# Patient Record
Sex: Male | Born: 1940 | Race: White | Hispanic: No | Marital: Single | Smoking: Never smoker
Health system: Southern US, Community
[De-identification: ages and names within clinical notes are randomized; demographics above are authoritative.]

## PROBLEM LIST (undated history)

## (undated) DIAGNOSIS — E7800 Pure hypercholesterolemia, unspecified: Secondary | ICD-10-CM

## (undated) DIAGNOSIS — F0390 Unspecified dementia without behavioral disturbance: Secondary | ICD-10-CM

## (undated) DIAGNOSIS — I10 Essential (primary) hypertension: Secondary | ICD-10-CM

## (undated) DIAGNOSIS — I4891 Unspecified atrial fibrillation: Secondary | ICD-10-CM

## (undated) DIAGNOSIS — K219 Gastro-esophageal reflux disease without esophagitis: Secondary | ICD-10-CM

## (undated) DIAGNOSIS — K7460 Unspecified cirrhosis of liver: Secondary | ICD-10-CM

## (undated) DIAGNOSIS — N189 Chronic kidney disease, unspecified: Secondary | ICD-10-CM

## (undated) DIAGNOSIS — I509 Heart failure, unspecified: Secondary | ICD-10-CM

## (undated) HISTORY — PX: CORONARY ARTERY BYPASS GRAFT: SHX141

## (undated) HISTORY — PX: CARDIAC VALVE REPLACEMENT: SHX585

## (undated) HISTORY — PX: MOUTH SURGERY: SHX715

## (undated) HISTORY — PX: AORTIC VALVE REPLACEMENT: SHX41

## (undated) HISTORY — PX: OTHER SURGICAL HISTORY: SHX169

---

## 2004-12-22 ENCOUNTER — Other Ambulatory Visit: Payer: Self-pay

## 2004-12-22 ENCOUNTER — Inpatient Hospital Stay: Payer: Self-pay | Admitting: Internal Medicine

## 2006-04-23 ENCOUNTER — Ambulatory Visit: Payer: Self-pay | Admitting: Internal Medicine

## 2011-05-09 ENCOUNTER — Inpatient Hospital Stay: Payer: Self-pay | Admitting: Internal Medicine

## 2011-05-16 ENCOUNTER — Emergency Department: Payer: Self-pay | Admitting: Emergency Medicine

## 2011-05-21 ENCOUNTER — Inpatient Hospital Stay: Payer: Self-pay | Admitting: Internal Medicine

## 2011-05-22 LAB — CBC WITH DIFFERENTIAL/PLATELET
Basophil #: 0 10*3/uL (ref 0.0–0.1)
Basophil %: 0.5 %
Eosinophil %: 1.3 %
HGB: 11.2 g/dL — ABNORMAL LOW (ref 13.0–18.0)
Lymphocyte #: 1.2 10*3/uL (ref 1.0–3.6)
Lymphocyte %: 19.3 %
MCHC: 33 g/dL (ref 32.0–36.0)
Monocyte #: 0.5 10*3/uL (ref 0.0–0.7)
Monocyte %: 8.2 %
Neutrophil %: 70.7 %
Platelet: 169 10*3/uL (ref 150–440)
RBC: 4.09 10*6/uL — ABNORMAL LOW (ref 4.40–5.90)
WBC: 6.3 10*3/uL (ref 3.8–10.6)

## 2011-05-22 LAB — CK TOTAL AND CKMB (NOT AT ARMC)
CK, Total: 122 U/L (ref 35–232)
CK-MB: 8.6 ng/mL — ABNORMAL HIGH (ref 0.5–3.6)

## 2011-05-22 LAB — BASIC METABOLIC PANEL
BUN: 36 mg/dL — ABNORMAL HIGH (ref 7–18)
Chloride: 101 mmol/L (ref 98–107)
Co2: 25 mmol/L (ref 21–32)
EGFR (Non-African Amer.): 22 — ABNORMAL LOW
Glucose: 71 mg/dL (ref 65–99)
Potassium: 6 mmol/L — ABNORMAL HIGH (ref 3.5–5.1)
Sodium: 134 mmol/L — ABNORMAL LOW (ref 136–145)

## 2011-05-22 LAB — PRO B NATRIURETIC PEPTIDE: B-Type Natriuretic Peptide: 34595 pg/mL — ABNORMAL HIGH (ref 0–125)

## 2011-05-22 LAB — PHOSPHORUS: Phosphorus: 4.1 mg/dL (ref 2.5–4.9)

## 2011-05-22 LAB — TROPONIN I: Troponin-I: 3.8 ng/mL — ABNORMAL HIGH

## 2011-05-23 LAB — CBC WITH DIFFERENTIAL/PLATELET
Basophil #: 0 10*3/uL (ref 0.0–0.1)
Basophil %: 0.8 %
Eosinophil #: 0.1 10*3/uL (ref 0.0–0.7)
Eosinophil %: 1.2 %
HCT: 34.8 % — ABNORMAL LOW (ref 40.0–52.0)
HGB: 11.3 g/dL — ABNORMAL LOW (ref 13.0–18.0)
Lymphocyte #: 1.1 10*3/uL (ref 1.0–3.6)
Lymphocyte %: 19.6 %
MCH: 27.1 pg (ref 26.0–34.0)
MCHC: 32.5 g/dL (ref 32.0–36.0)
MCV: 84 fL (ref 80–100)
Monocyte #: 0.6 10*3/uL (ref 0.0–0.7)
Monocyte %: 10.6 %
Neutrophil #: 3.9 10*3/uL (ref 1.4–6.5)
Neutrophil %: 67.8 %
Platelet: 170 10*3/uL (ref 150–440)
RBC: 4.16 10*6/uL — ABNORMAL LOW (ref 4.40–5.90)
RDW: 17.4 % — ABNORMAL HIGH (ref 11.5–14.5)
WBC: 5.8 10*3/uL (ref 3.8–10.6)

## 2011-05-23 LAB — URINALYSIS, COMPLETE
Blood: NEGATIVE
Glucose,UR: NEGATIVE mg/dL (ref 0–75)
Ketone: NEGATIVE
Leukocyte Esterase: NEGATIVE
Nitrite: NEGATIVE
Ph: 6 (ref 4.5–8.0)
Protein: NEGATIVE
Specific Gravity: 1.009 (ref 1.003–1.030)
WBC UR: 2 /HPF (ref 0–5)

## 2011-05-23 LAB — COMPREHENSIVE METABOLIC PANEL
Albumin: 2.9 g/dL — ABNORMAL LOW (ref 3.4–5.0)
Alkaline Phosphatase: 55 U/L (ref 50–136)
Anion Gap: 11 (ref 7–16)
BUN: 35 mg/dL — ABNORMAL HIGH (ref 7–18)
Bilirubin,Total: 0.5 mg/dL (ref 0.2–1.0)
Calcium, Total: 8.5 mg/dL (ref 8.5–10.1)
Chloride: 105 mmol/L (ref 98–107)
Co2: 27 mmol/L (ref 21–32)
Creatinine: 2.96 mg/dL — ABNORMAL HIGH (ref 0.60–1.30)
EGFR (African American): 27 — ABNORMAL LOW
EGFR (Non-African Amer.): 22 — ABNORMAL LOW
Glucose: 104 mg/dL — ABNORMAL HIGH (ref 65–99)
Osmolality: 293 (ref 275–301)
Potassium: 5.4 mmol/L — ABNORMAL HIGH (ref 3.5–5.1)
SGOT(AST): 29 U/L (ref 15–37)
SGPT (ALT): 19 U/L
Sodium: 143 mmol/L (ref 136–145)
Total Protein: 5.8 g/dL — ABNORMAL LOW (ref 6.4–8.2)

## 2011-05-23 LAB — PROTEIN / CREATININE RATIO, URINE
Creatinine, Urine: 145.7 mg/dL — ABNORMAL HIGH (ref 30.0–125.0)
Protein/Creat. Ratio: 96 mg/gCREAT (ref 0–200)

## 2011-05-24 LAB — RENAL FUNCTION PANEL
Anion Gap: 8 (ref 7–16)
BUN: 28 mg/dL — ABNORMAL HIGH (ref 7–18)
Creatinine: 2.24 mg/dL — ABNORMAL HIGH (ref 0.60–1.30)
EGFR (African American): 38 — ABNORMAL LOW
EGFR (Non-African Amer.): 31 — ABNORMAL LOW
Glucose: 137 mg/dL — ABNORMAL HIGH (ref 65–99)
Osmolality: 291 (ref 275–301)
Phosphorus: 3.6 mg/dL (ref 2.5–4.9)
Potassium: 5.4 mmol/L — ABNORMAL HIGH (ref 3.5–5.1)
Sodium: 142 mmol/L (ref 136–145)

## 2011-07-01 ENCOUNTER — Ambulatory Visit: Payer: Self-pay | Admitting: Internal Medicine

## 2014-02-20 LAB — PRO B NATRIURETIC PEPTIDE: B-Type Natriuretic Peptide: 793 pg/mL — ABNORMAL HIGH (ref 0–125)

## 2014-02-20 LAB — BASIC METABOLIC PANEL
Anion Gap: 8 (ref 7–16)
BUN: 14 mg/dL (ref 7–18)
CALCIUM: 8.2 mg/dL — AB (ref 8.5–10.1)
Chloride: 108 mmol/L — ABNORMAL HIGH (ref 98–107)
Co2: 25 mmol/L (ref 21–32)
Creatinine: 1.41 mg/dL — ABNORMAL HIGH (ref 0.60–1.30)
EGFR (African American): >60
EGFR (Non-African Amer.): 52 — ABNORMAL LOW
GLUCOSE: 147 mg/dL — AB (ref 65–99)
OSMOLALITY: 284 (ref 275–301)
POTASSIUM: 3.7 mmol/L (ref 3.5–5.1)
SODIUM: 141 mmol/L (ref 136–145)

## 2014-02-20 LAB — APTT: Activated PTT: 51.6 secs — ABNORMAL HIGH (ref 23.6–35.9)

## 2014-02-20 LAB — PROTIME-INR
INR: 2.3
PROTHROMBIN TIME: 25 s — AB (ref 11.5–14.7)

## 2014-02-20 LAB — CBC
HCT: 44.8 % (ref 40.0–52.0)
HGB: 14.4 g/dL (ref 13.0–18.0)
MCH: 22.8 pg — ABNORMAL LOW (ref 26.0–34.0)
MCHC: 32.1 g/dL (ref 32.0–36.0)
MCV: 71 fL — AB (ref 80–100)
Platelet: 177 10*3/uL (ref 150–440)
RBC: 6.32 10*6/uL — ABNORMAL HIGH (ref 4.40–5.90)
RDW: 20.1 % — AB (ref 11.5–14.5)
WBC: 10.8 10*3/uL — ABNORMAL HIGH (ref 3.8–10.6)

## 2014-02-20 LAB — TROPONIN I: Troponin-I: <0.02 ng/mL

## 2014-02-21 ENCOUNTER — Observation Stay: Payer: Self-pay | Admitting: Internal Medicine

## 2014-02-21 LAB — COMPREHENSIVE METABOLIC PANEL
ANION GAP: 4 — AB (ref 7–16)
Albumin: 3 g/dL — ABNORMAL LOW (ref 3.4–5.0)
Alkaline Phosphatase: 116 U/L
BUN: 12 mg/dL (ref 7–18)
Bilirubin,Total: 0.3 mg/dL (ref 0.2–1.0)
CHLORIDE: 110 mmol/L — AB (ref 98–107)
CO2: 30 mmol/L (ref 21–32)
CREATININE: 1.45 mg/dL — AB (ref 0.60–1.30)
Calcium, Total: 7.8 mg/dL — ABNORMAL LOW (ref 8.5–10.1)
EGFR (African American): >60
EGFR (Non-African Amer.): 51 — ABNORMAL LOW
Glucose: 101 mg/dL — ABNORMAL HIGH (ref 65–99)
Osmolality: 287 (ref 275–301)
POTASSIUM: 3.5 mmol/L (ref 3.5–5.1)
SGOT(AST): 52 U/L — ABNORMAL HIGH (ref 15–37)
SGPT (ALT): 90 U/L — ABNORMAL HIGH
Sodium: 144 mmol/L (ref 136–145)
Total Protein: 5.6 g/dL — ABNORMAL LOW (ref 6.4–8.2)

## 2014-02-21 LAB — PROTIME-INR
INR: 2.9
Prothrombin Time: 29.2 secs — ABNORMAL HIGH (ref 11.5–14.7)

## 2014-02-21 LAB — TROPONIN I
Troponin-I: 0.02 ng/mL
Troponin-I: 0.02 ng/mL

## 2014-02-22 ENCOUNTER — Emergency Department: Payer: Self-pay | Admitting: Emergency Medicine

## 2014-02-22 LAB — BASIC METABOLIC PANEL
ANION GAP: 2 — AB (ref 7–16)
Anion Gap: 6 — ABNORMAL LOW (ref 7–16)
BUN: 13 mg/dL (ref 7–18)
BUN: 16 mg/dL (ref 7–18)
CALCIUM: 7.8 mg/dL — AB (ref 8.5–10.1)
Calcium, Total: 7.9 mg/dL — ABNORMAL LOW (ref 8.5–10.1)
Chloride: 110 mmol/L — ABNORMAL HIGH (ref 98–107)
Chloride: 110 mmol/L — ABNORMAL HIGH (ref 98–107)
Co2: 28 mmol/L (ref 21–32)
Co2: 26 mmol/L (ref 21–32)
Creatinine: 1.49 mg/dL — ABNORMAL HIGH (ref 0.60–1.30)
Creatinine: 1.62 mg/dL — ABNORMAL HIGH (ref 0.60–1.30)
EGFR (Non-African Amer.): 49 — ABNORMAL LOW
EGFR (Non-African Amer.): 45 — ABNORMAL LOW
GFR CALC AF AMER: 60 — AB
GFR CALC AF AMER: 54 — AB
GLUCOSE: 99 mg/dL (ref 65–99)
Glucose: 103 mg/dL — ABNORMAL HIGH (ref 65–99)
OSMOLALITY: 287 (ref 275–301)
Osmolality: 277 (ref 275–301)
POTASSIUM: 3.8 mmol/L (ref 3.5–5.1)
Potassium: 3.6 mmol/L (ref 3.5–5.1)
SODIUM: 144 mmol/L (ref 136–145)
Sodium: 138 mmol/L (ref 136–145)

## 2014-02-22 LAB — CBC WITH DIFFERENTIAL/PLATELET
BASOS ABS: 0.1 10*3/uL (ref 0.0–0.1)
BASOS ABS: 0.1 10*3/uL (ref 0.0–0.1)
Basophil %: 1 %
Basophil %: 1.3 %
EOS PCT: 1.5 %
EOS PCT: 0.8 %
Eosinophil #: 0.1 10*3/uL (ref 0.0–0.7)
Eosinophil #: 0.1 10*3/uL (ref 0.0–0.7)
HCT: 38.2 % — AB (ref 40.0–52.0)
HCT: 44 % (ref 40.0–52.0)
HGB: 12.3 g/dL — ABNORMAL LOW (ref 13.0–18.0)
HGB: 13.9 g/dL (ref 13.0–18.0)
LYMPHS ABS: 1.9 10*3/uL (ref 1.0–3.6)
Lymphocyte #: 1.1 10*3/uL (ref 1.0–3.6)
Lymphocyte %: 14.4 %
Lymphocyte %: 16.7 %
MCH: 23.1 pg — AB (ref 26.0–34.0)
MCH: 22.7 pg — AB (ref 26.0–34.0)
MCHC: 32.1 g/dL (ref 32.0–36.0)
MCHC: 31.6 g/dL — AB (ref 32.0–36.0)
MCV: 72 fL — ABNORMAL LOW (ref 80–100)
MCV: 72 fL — ABNORMAL LOW (ref 80–100)
Monocyte #: 0.7 x10 3/mm (ref 0.2–1.0)
Monocyte #: 1.2 x10 3/mm — ABNORMAL HIGH (ref 0.2–1.0)
Monocyte %: 9 %
Monocyte %: 10.4 %
NEUTROS ABS: 5.5 10*3/uL (ref 1.4–6.5)
Neutrophil #: 8.1 10*3/uL — ABNORMAL HIGH (ref 1.4–6.5)
Neutrophil %: 74.1 %
Neutrophil %: 70.8 %
Platelet: 134 10*3/uL — ABNORMAL LOW (ref 150–440)
Platelet: 198 10*3/uL (ref 150–440)
RBC: 5.3 10*6/uL (ref 4.40–5.90)
RBC: 6.12 10*6/uL — ABNORMAL HIGH (ref 4.40–5.90)
RDW: 20.2 % — AB (ref 11.5–14.5)
RDW: 20.5 % — ABNORMAL HIGH (ref 11.5–14.5)
WBC: 7.5 10*3/uL (ref 3.8–10.6)
WBC: 11.4 10*3/uL — ABNORMAL HIGH (ref 3.8–10.6)

## 2014-02-22 LAB — LIPID PANEL
Cholesterol: 131 mg/dL (ref 0–200)
HDL: 33 mg/dL — AB (ref 40–60)
Ldl Cholesterol, Calc: 79 mg/dL (ref 0–100)
Triglycerides: 93 mg/dL (ref 0–200)
VLDL Cholesterol, Calc: 19 mg/dL (ref 5–40)

## 2014-02-22 LAB — TSH: THYROID STIMULATING HORM: 4.28 u[IU]/mL

## 2014-02-22 LAB — HEMOGLOBIN A1C: Hemoglobin A1C: 6.6 % — ABNORMAL HIGH (ref 4.2–6.3)

## 2014-02-22 LAB — PROTIME-INR
INR: 2.6
Prothrombin Time: 27.2 secs — ABNORMAL HIGH (ref 11.5–14.7)

## 2014-02-22 LAB — MAGNESIUM: Magnesium: 1.9 mg/dL

## 2014-02-22 LAB — TROPONIN I

## 2014-02-26 LAB — CULTURE, BLOOD (SINGLE)

## 2014-07-06 ENCOUNTER — Ambulatory Visit: Payer: Self-pay | Admitting: Internal Medicine

## 2014-07-08 ENCOUNTER — Emergency Department: Payer: Self-pay | Admitting: Internal Medicine

## 2014-08-09 ENCOUNTER — Ambulatory Visit: Payer: Self-pay | Admitting: Internal Medicine

## 2014-09-11 NOTE — Discharge Summary (Signed)
PATIENT NAME:  [name], [name] MR#:  [id number] DATE OF BIRTH:  April 29, 1941  DATE OF ADMISSION:  02/21/2014 DATE OF DISCHARGE:  02/22/2014  ADMISSION DIAGNOSES:   1.  Chest pain with a history of coronary artery disease.   2.  Dizziness.   DISCHARGE DIAGNOSES:  1.  Chest pain likely related to a gastrointestinal issue and not cardiac in nature, has ruled out for acute coronary syndrome.  2.  Orthostatic hypotension.  3.  History of coronary artery disease.   4.  Left bundle branch block, old.  5.  Borderline type 2 diabetes.  6.  Arrhythmia likely paroxysmal atrial fibrillation.  7.  Pneumonia has been ruled out.   HOSPITAL COURSE: This is a 74 year old male who presented with chest pain relieved with GI cocktail and dizziness. For further details please refer to the H and P.   1.  Chest pain.  The patient had no chest pain during this whole hospitalization. His troponins were all negative and he had an echocardiogram which showed ejection fraction of 65-70%, impaired relaxation of LV diastolic dysfunction, left bundle branch block which is old, but no wall motion abnormalities. I suspect again this is all GI related. But given his history of CAD he was admitted to rule out acute coronary syndrome.  2.  Orthostatic hypotension as the cause of the patient's dizziness. The patient did dizziness have upon standing when he was orthostatic. We provided IV fluids, he is no longer dizzy, he is no longer orthostatic.  3.  History of CAD. The patient will continue on statin and Plavix.  4.  Left bundle branch block which is old.  5.  Borderline type 2 diabetes. The patient will need to have followup outpatient. He will need referral to outpatient diabetes center.   6.  Suspected pneumonia had been ruled out. Initially the patient was thought to have possible pneumonia but this was ruled out by the CT of the chest which was performed on admission showing no evidence of pneumonia. 7.  Arrhythmia. The patient  likely has underlying atrial fibrillation, he is on Coumadin and amiodarone.   DISCHARGE MEDICATIONS:  1. Lasix 20 mg daily.  2. Atorvastatin 20 mg daily.  3. Prevacid 30 mg daily.  4. Plavix 75 mg daily.  5. Amiodarone 200 mg daily.  6. Coumadin 2 mg at bedtime.  7. Nitroglycerin sublingual p.r.n. chest pain.   DISPOSITION:  Discharge home with home health, physical therapy, and nurse for assessment and gait.   DISCHARGE DIET: Low sodium.   DISCHARGE ACTIVITY: As tolerated.   DISCHARGE FOLLOWUP: The patient needs to follow up with Dr. Juel BurrowMasoud next week.   TIME SPENT: 35 minutes.   The patient was stable for discharge.    ____________________________ Endrit Gittins P. Juliene PinaMody, MD spm:bu D: 02/22/2014 13:19:42 ET T: 02/22/2014 14:31:54 ET JOB#: [id number]  cc: Kamaya Keckler P. Juliene PinaMody, MD, <Dictator> Corky DownsJaved Masoud, MD Janyth ContesSITAL P Isabel Freese MD ELECTRONICALLY SIGNED 02/22/2014 21:12

## 2014-09-11 NOTE — H&P (Signed)
PATIENT NAME:  [name], [name] MR#:  [id number] DATE OF BIRTH:  1941-03-21  DATE OF ADMISSION:  02/21/2014  REFERRING PHYSICIAN: Gladstone Pih, MD  PRIMARY CARE DOCTOR: Corky Downs, MD  ADMITTING DOCTOR: Crissie Figures, MD  CHIEF COMPLAINT: Substernal/epigastric chest pain.  HISTORY OF PRESENT ILLNESS: A 74 year old Caucasian male with a past medical history significant for hypertension, coronary artery disease status post CABG in 2012, history of moderate to severe aortic stenosis, chronic systolic congestive heart failure with ejection fraction of 25% to 30%, gastroesophageal reflux disease, CKD stage 3, hyperlipidemia, presents to the Emergency Room with a complaint of substernal/epigastric chest pain of burning nature since this morning. Patient states he was in his usual state of health until this morning, when he has been experiencing episodes of substernal burning chest discomfort going on and off. Denies any diaphoresis. No radiation of pain. Does feel some dizziness on standing on and off, but no loss of consciousness. Denies any fever, cough, shortness of breath. No nausea, vomiting, diarrhea, constipation. Denies any dysuria or hematuria. In the Emergency Room, the patient was evaluated by the ED physician, and on presentation his blood pressure was elevated at 165/92 and he was given 1 sublingual nitroglycerin, following which he had partial relief of the substernal chest discomfort. He also received GI cocktail at the same time, which relieved his substernal pain completely. Of note, following sublingual nitroglycerin, his blood pressure has dropped transiently to 103/65 and patient did not have any symptoms at that time, and subsequently the blood pressure has improved and is currently staying in the normal range, around 140/80. No history of any recent fever, cough. Patient now is resting comfortably in the bed. Denies any chest pain or shortness of breath. Chest x-ray done in the Emergency  Room by the ED physician shows abnormality, with volume loss/consolidation in the left lung base with probable small left pleural effusion, suggesting pneumonia. Patient denies any fever, cough, or sputum production.  LABORATORY DATA: Labs are significant for mildly elevated white blood cell count of 10.8.  PAST MEDICAL HISTORY: Hypertension; coronary artery disease status post CABG in 2012; history of chronic systolic congestive heart failure with ejection fraction of 25% to 30% per echo done in 2012; moderate to severe aortic stenosis; moderate aortic regurgitation per echo done in December 2012; hyperlipidemia; gastroesophageal reflux disease; chronic kidney disease stage 3; history of hyperkalemia, hence he is not on any ACE inhibitors; left bundle branch block.  PAST SURGICAL HISTORY: Coronary artery bypass graft in 2012.  ALLERGIES: No known drug allergies.  HOME MEDICATIONS: Amiodarone 200 mg tablet, 1 tablet orally; atorvastatin 20 mg p.o. q. daily; clopidogrel 75 mg 1 tablet daily; furosemide 20 mg 1 tablet orally daily; Prevacid 30 mg 1 tablet daily.  SOCIAL HISTORY: Single. Lives alone. No history of any smoking, alcohol, or IV substance uses.  FAMILY HISTORY: Significant for dad with coronary artery disease and mom with some cancer, not sure of what kind of a cancer.  REVIEW OF SYSTEMS: CONSTITUTIONAL: No fever, no fatigue, no weakness, no excessive weight gain or weight loss lately. EYES: Negative for any blurred vision or double vision. No pain, no redness, no inflammation. ENT: Negative for tinnitus, ear pain, epistaxis, discharge. No redness of oropharynx. RESPIRATORY: Negative for cough, wheezing, hemoptysis or painful respirations. On questioning, he admits he has some dyspnea on exertion at times, but not significant. CARDIOVASCULAR: Did have some substernal burning pain prior to coming to the Emergency Room, which resolved after Maalox. No  orthopnea. No edema. No  palpitations. He does have some dizziness on standing on and off at times. GASTROINTESTINAL: Denies any nausea, vomiting, diarrhea, abdominal pain, hematemesis, melena, or rectal bleeding. Chronic GERD symptoms, stable with PPI. GENITOURINARY: Negative for dysuria, hematuria, frequency, or urgency. ENDOCRINE: Negative for polyuria. No heat or cold intolerance. HEMATOLOGIC: Negative for easy bruising or bleeding. SKIN: Negative for any rash or lesions. MUSCULOSKELETAL: No arthritis. No joint swellings.  NEUROLOGICAL: Negative for any focal weakness or numbness. No gait disturbances. No history of CVA, TIA, seizure disorder. PSYCHIATRIC: Negative for anxiety, insomnia, or depression.  PHYSICAL EXAMINATION: VITAL SIGNS: On arrival to the Emergency Room, temperature 98.4, pulse rate 81, respirations 20, blood pressure 165/92, O2 saturation 95% on room air. Current vitals: Pulse rate 62 per minute, respirations 18, blood pressure 125/81, O2 saturations 98% on room air. GENERAL: Well-developed, well-nourished, alert, in no acute distress. Pleasant and cooperative. HEAD: Atraumatic, normocephalic. EYES: Pupils equal, round, and reactive to light and accommodation. No conjunctival pallor. No scleral icterus. Extraocular movements intact. NOSE: No nasal lesions. No drainage. EARS: No drainage, no external lesions. MOUTH: No mucosal lesions, no exudates. NECK: Supple. No JVD, no thyromegaly, no carotid bruit. Full range of motion without any pain.  RESPIRATORY: Good respiratory effort. Diminished breath sounds, left lung base. The rest of the lung fields clear to auscultation.  CARDIOVASCULAR: Regular rate and rhythm. Grade 2 to 3 systolic murmur in the aortic area present. Pulses equal at carotid, femoral, and pedal pulses. No peripheral edema. GASTROINTESTINAL: No tenderness or masses. No hepatosplenomegaly. Bowel sounds equal in all 4 quadrants. Abdomen not distended. No rebound. No  guarding. GENITOURINARY: Deferred. MUSCULOSKELETAL: Gait not tested. Normal range of motion. Strength and tone equal bilateral, symmetrical. SKIN: Inspection within normal limits. LYMPH: No cervical lymphadenopathy. VASCULAR: Good dorsalis pedis and posterior tibial pulses. NEUROLOGICAL: Alert, awake, and oriented x 3. Reflexes 2+ bilateral and symmetrical. Motor strength 4/4 bilateral upper and lower extremities.  PSYCHIATRIC: Judgment, insight adequate. Alert and oriented x 3. Memory and mood within normal limits.  LABORATORY DATA: Glucose elevated at 147. BNP 793. BUN 14, creatinine 1.41. Sodium 141, potassium 3.7, chloride 108, bicarb 25. Estimated GFR 52. Total calcium 8.2. Troponin less than 0.02. WBC 10.8, hemoglobin 14.4, hematocrit 44.8, platelet count 177,000. MCV 71. Prothrombin time 25.0, INR 2.3, PTT 51.6. Chest x-ray: Volume loss/consolidation, left lung base, with probable small left pleural effusion, suggesting pneumonia. EKG: Normal sinus rhythm with ventricular rate of 81 beats per minute. Left axis deviation. Left bundle branch block. T-wave inversions in leads I and aVL.  ASSESSMENT AND PLAN: A 74 year old Caucasian male with a past medical history significant for coronary artery disease status post coronary artery bypass graft in 2012, presents to the Emergency Room with complaints of substernal/epigastric burning chest pain on and off since this morning. Received sublingual nitroglycerin in the Emergency Room, with partial relief of pain. Patient also received Maalox, following which burning chest pain resolved completely. Rule out acute coronary syndrome.  PLAN: 1.  Admit to telemetry. Cycle cardiac enzymes. Will get echocardiogram in view of history of aortic stenosis and congestive heart failure. Cardiology consultation requested for further evaluation. 2.  Abnormal chest x-ray with infiltrate in the left lung base. Consolidation versus atelectasis versus pleural effusion.  Patient does not have any history of any fever, cough, sputum production. White blood cell count is slightly elevated at 10.8. I will order blood cultures and sputum cultures. I will start empirically on Levaquin to cover for  possible community-acquired pneumonia. I will get a CT of the chest, noncontrast study, for further evaluation of abnormality on the chest x-ray, and further workup depending on the CT findings. 3.  History of systolic congestive heart failure with ejection fraction of 25% to 30% per previous echo done in 2012. Patient stable. Patient on Lasix 20 mg. Continue Lasix and monitor, and we will get echocardiogram. 4.  Coronary artery disease, history of myocardial infarction status post coronary artery bypass graft in 2012. 5.  History of gastroesophageal reflux disease on proton pump inhibitor. Continue proton pump inhibitor. 6.  History of moderate to severe aortic stenosis per echo done in 2012. Patient does complain of some dizziness on standing. Will get echocardiogram for further evaluation.  7.  History of chronic kidney disease stage 3. Creatinine currently 1.41. Monitor renal functions. Avoid nephrotoxic agents. 8.  Left bundle branch block on EKG, which is chronic. 9.  Hyperlipidemia on atorvastatin. Continue same. 10.  History of borderline diabetes mellitus. Blood sugars elevated. Patient not on any medications. HbA1c ordered, and follow up accordingly. 11.  History of hyperkalemia during prior admission in 2012. Hence, he is not on ACE inhibitors.  CODE STATUS: Full status.  TIME SPENT: 50 minutes.   ____________________________ Crissie Figures, MD enr:ST D: 02/21/2014 01:01:40 ET T: 02/21/2014 01:55:04 ET JOB#: [id number]  cc: Crissie Figures, MD, <Dictator> Corky Downs, MD Crissie Figures MD ELECTRONICALLY SIGNED 02/21/2014 14:53

## 2014-09-11 NOTE — Consult Note (Signed)
PATIENT NAME:  [name], [name] MR#:  [id number] DATE OF BIRTH:  08-30-1940  DATE OF CONSULTATION:  02/21/2014  REFERRING PHYSICIAN:  Dr. Betti Cruz CONSULTING PHYSICIAN:  Ammar Moffatt D. Juliann Pares, MD  PRIMARY PHYSICIAN: Corky Downs, MD   INDICATION: Vertigo, chest pain.   HISTORY OF PRESENT ILLNESS: The patient is a 74 year old white male with history of hypertension, coronary artery disease, status post coronary artery bypass surgery in 2012, moderate to severe aortic stenosis by echocardiogram in the past, chronic congestive heart failure with systolic dysfunction, ejection fraction between 25% and 30%, complains of esophageal reflux disease, renal insufficiency stage 3, hyperlipidemia, presented to the Emergency Room with complaints of substernal chest pain burning in the middle of his chest. Granddaughter who looks after him states that a day or so ago he complained of abdominal pain and had gotten some Pepto Bismol which seemed like it helped, but the evening of admission the patient had recurrent chest pain, so his sister called rescue because of the burning in his chest and he was admitted for further evaluation. He denied any diaphoresis. Denies any significant nausea or vomiting. No diarrhea or constipation at the time. Had some vertigo, but no blackout spells, worse when he stands up. No dysuria or hematuria. Blood pressure was elevated at 165/90 systolic. He got sublingual nitroglycerin with some relief. Also given a GI cocktail. Nitroglycerin made his blood pressure drop somewhat, began to feel reasonably better, and subsequently was advised to be admitted.   LABORATORIES: Elevated white count of 10.8; otherwise, his glucose is 147, BNP 793, BUN 14, creatinine 1.4, sodium 141, potassium 3.7, chloride of 108. Troponin was 0.02. White count 10.8, hemoglobin of 14.4, hematocrit 44.8, platelet count of 177,000. Pro time is 25, PTT 51. Chest x-ray shows some volume loss in the low 30s.   EKG: Sinus rhythm, rate  of 80, nonspecific left bundle branch block, otherwise negative.   PAST MEDICAL HISTORY: Hypertension, coronary artery disease, aortic stenosis, systolic heart failure, aortic insufficiency, hyperlipidemia, reflux, renal insufficiency, history of hyperkalemia in the past, left bundle branch block on EKG.   PAST SURGICAL HISTORY: Coronary artery bypass surgery.   ALLERGIES: None.   MEDICATIONS: Amiodarone 200 mg a day, Lipitor 20 a day, Plavix 75 a day, Lasix 20 a day, Prevacid 30 a day.   SOCIAL HISTORY: Lives alone. No smoking or alcohol consumption. His granddaughter looks after his affairs.   FAMILY HISTORY: Coronary artery disease, cancer.  REVIEW OF SYSTEMS: No blackout spells, no syncope. He has had some vertigo and dizziness. No weight loss. No weight gain. No hemoptysis or hematemesis. No bright red blood per rectum.  Denies sputum production. Denies cough. He has complained of reflux symptoms. He has had some chest pain. No polyuria or polydipsia. No bruising. No significant headaches.   PHYSICAL EXAMINATION:  VITAL SIGNS: Blood pressure 160/90, pulse of 80, respiratory rate of 18, afebrile.  HEENT: Normocephalic, atraumatic. Pupils equal, round, reactive to light. Extraocular muscles intact.   HEART: Regular rate and rhythm. Systolic ejection murmur at left sternal border. Positive S3.  ABDOMEN: Benign.  EXTREMITIES: Within normal limits.  NEUROLOGIC: Intact.  SKIN: Normal   ASSESSMENT: Chest pain, possible unstable angina, history of cardiomyopathy, systolic dysfunction, renal insufficiency, hypertension, aortic stenosis, murmur, bundle branch block, hyperlipidemia, vertigo, abnormal chest x-ray.   PLAN:  1.  Agree with admit. Rule out for myocardial infarction. Follow up cardiac examination. Place  on telemetry. Recommend treatment with short-term anticoagulation and aspirin. If the patient rules out,  consider functional study versus cardiac catheterization.  2.  For vertigo,  follow up for further evaluation and further carotid Dopplers. Also, would consider workup with meclizine as necessary. Also consider treatment for hypertension therapy. The patient thought she had atrial fibrillation in the past. No anticoagulation for now. Continue Coumadin therapy for anticoagulation. I suspect he is on Coumadin, but it is not clear from his medication list, but his PT is prolonged. Recommend Lasix therapy for mild shortness of breath, congestion and continue reflux therapy with proton pump inhibitor. Echocardiogram will be helpful. Left bundle branch block is probably chronic.   Base further evaluation on the results of these studies. We will try to contact Dr. Juel BurrowMasoud to see if he is engaged in the case, and take over the care for Mr. Marina Goodellerry, but the vertigo and the chest tightness needs to be evaluated. He may end up getting a cardiac catheterization, but his coagulopathy will make it difficult to do so until his PT is back to normal.   ____________________________ Bobbie Stackwayne D. Juliann Paresallwood, MD ddc:TT D: 02/21/2014 14:09:00 ET T: 02/21/2014 16:27:03 ET JOB#: [id number]  cc: Zophia Marrone D. Juliann Paresallwood, MD, <Dictator> Alwyn PeaWAYNE D Taim Wurm MD ELECTRONICALLY SIGNED 03/24/2014 10:32

## 2014-09-12 NOTE — Consult Note (Signed)
PATIENT NAME:  [name], [name] MR#:  [id number] DATE OF BIRTH:  1941/04/19  DATE OF CONSULTATION:  05/22/2011  REFERRING PHYSICIAN:  Dr. Dava NajjarPanwar  CONSULTING PHYSICIAN:  Sherle Mello [name] Noreen, MD  REASON FOR CONSULTATION: Acute renal failure.   HISTORY OF PRESENT ILLNESS: The patient is a 74 year old Caucasian male with past medical history of hypertension, hyperlipidemia, possible diabetes mellitus, chronic systolic heart failure with ejection fraction of 40 to 45%, gastroesophageal reflux disease, moderate aortic stenosis and moderate aortic regurgitation, chronic kidney disease stage III with baseline creatinine 1.8, and history of hyperkalemia who presented to [facility] yesterday with complaints of shortness of breath. The patient reports that his shortness of breath was progressive over one day. He has had similar episodes in the past and has known underlying chronic systolic heart failure. As above, his most recent ejection fraction on cardiac catheterization was found to be 40 to 45%. He was admitted for systolic heart failure exacerbation. He was also noted to have renal insufficiency at the time of admission. His baseline creatinine appears to be 1.8 from December 20. His creatinine upon presentation was found to be 2.68. The patient is known to be on lisinopril at home. He was started on Lasix 20 mg IV b.i.d. His urine output is documented as only being 300 mL; however, he does not appear to have a Foley catheter in place at this point in time. He had a CT scan of the abdomen and pelvis which was negative for hydronephrosis.   PAST MEDICAL HISTORY:  1. Hypertension.  2. Diabetes mellitus with most recent hemoglobin A1c of 6.4.  3. Hyperlipidemia.  4. Chronic systolic heart failure, ejection fraction 40 to 45%,.  5. Gastroesophageal reflux disease.  6. Chronic kidney disease stage III with baseline creatinine 1.8.  7. Moderate aortic stenosis.  8. History of hyperkalemia.    PAST SURGICAL HISTORY: None.   CURRENT INPATIENT MEDICATIONS:  1. Tylenol 650 mg p.o. every four hours p.r.n.  2. Norco 5/325, 1 to 2 tablets p.o. every four hours p.r.n. pain. 3. Maalox 30 mL p.o. q. six hours.  4. Aspirin 81 mg daily.  5. Lipitor 20 mg daily.  6. Plavix 75 mg daily.  7. Lasix 20 mg IV every 12 hours.  8. Sliding scale insulin.  9. Metoprolol succinate 100 mg daily.  10. Nitroglycerin 0.4 mg sublingual every five minutes p.r.n. chest pain.  11. Protonix 40 mg b.i.d.  12. Zofran 4 mg IV every four hours p.r.n.   SOCIAL HISTORY: The [place] in [place]. He is single. He has no children. He used to work in farming. He denies smoking tobacco but does dip snuff. Denies alcohol or illicit drug use.   FAMILY HISTORY: The patient reports to me that his mother died of an unspecified cancer. Father died secondary to coronary artery disease.   REVIEW OF SYSTEMS:  CONSTITUTIONAL: Denies fevers, chills, or weight loss. EYES: Denies diplopia or blurry vision.  HEENT: Denies headaches, hearing loss, tinnitus, epistaxis, or sore throat.  PULMONARY: Does report a cough as well as shortness of breath. Denies hemoptysis. CARDIOVASCULAR: Currently denies chest pains or palpitations, does have some orthopnea. GI: Denies nausea, vomiting, diarrhea, or bloody stools. GU: Denies frequency, urgency, or dysuria. NEUROLOGIC: Denies focal extremity numbness, weakness, or tingling.  HEMATOLOGIC: Denies easy bruisability or bleeding. ENDOCRINE: Denies polyuria, polydipsia, or polyphagia. MUSCULOSKELETAL: Denies joint pain, swelling, or redness. INTEGUMENT: Denies skin rashes or lesions.  PSYCHIATRIC: Denies depression or bipolar disorder. ALLERGY/IMMUNOLOGIC:  Denies seasonal allergies or history of immunodeficiency.   PHYSICAL EXAMINATION:  VITAL SIGNS: Temperature 98.4, pulse 68, respirations 20, blood pressure 102/54 with mean arterial pressure of 70, pulse oximetry 98% on 2 liters.   GENERAL:  Well-developed, well-nourished Caucasian male currently in no acute distress.   HEENT: Normocephalic, atraumatic. Extraocular movements are intact. Pupils equal, round, and reactive to light. No scleral icterus. Conjunctivae are pink. No epistaxis noted. Gross hearing intact. Oral mucosa moist. Native dentition is poor and stained with use of snuff.   NECK: Supple without JVD or obvious lymphadenopathy.   LUNGS: Clear to auscultation anteriorly. Posteriorly at the bases there are rales. Normal respiratory effort.   HEART: S1, S2. Regular rate and rhythm. 3/6 systolic ejection murmur heard.   ABDOMEN: Soft, nontender, nondistended. Bowel sounds positive. No rebound or guarding. No gross organomegaly appreciated.   EXTREMITIES: No clubbing, cyanosis, or edema noted.   NEUROLOGIC: The patient is alert and oriented to time, person, and place. Strength is five out of five in both upper and lower extremities.   SKIN: Warm and dry. No rashes or lesions noted.   MUSCULOSKELETAL: No joint redness, swelling, or tenderness appreciated.   PSYCHIATRIC: The patient with an appropriate affect.   LABORATORY DATA: Hemoglobin 11.2, hematocrit 34.1, platelets 169. Echo Doppler from 05/09/2011 shows ejection fraction 25% to 35%. However, cardiac catheterization on 05/09/2011 showed ejection fraction of 40 to 45%. There is an LAD lesion of 50%, RCA lesion of 75%.   IMPRESSION: This is a 74 year old Caucasian male with past medical history of hypertension, hyperlipidemia, possible diabetes mellitus, chronic systolic heart failure, coronary artery disease, gastroesophageal reflux disease, moderate aortic stenosis, chronic kidney disease stage III with baseline creatinine 1.8, and history of hyperkalemia who presented to [facility] with shortness of breath and diagnosed with acute systolic heart failure.  1. Acute renal failure: The patient has presented to [facility]  with acute systolic heart failure. I suspect that there is some element of renal hypoperfusion from his underlying heart failure. What confounds the picture is that the patient was on Lasix and his renal function has now worsened. He appears to be well compensated from his heart failure perspective. Therefore, we will discontinue Lasix at this point in time. No urgent indication for dialysis. However, we did discuss the potential need for temporary renal replacement therapy if his electrolytes were to worsen, in particular his hyperkalemia. He verbalized understanding of this. He had a CT scan of the abdomen and pelvis which was negative for hydronephrosis. Therefore, I do not feel that a renal ultrasound is necessary at this time.  2. Chronic kidney disease, stage III: Baseline creatinine is noted to be 1.8. The patient has been appropriately taken off of lisinopril given his hyperkalemia. We will check SPEP and UPEP. No need for renal ultrasound, as above, he had a CT scan of the abdomen and pelvis which was negative. In addition, he had a cardiac catheterization recently at which point in time renal angiogram was also performed. It appears that his renal arteries were patent at that time. Long-term he should be on an ACE inhibitor; however as above, for now we are holding given his acute renal failure.  3. Hyperkalemia: Potassium noted to be high today at 6, likely as a result of use of lisinopril and acute renal failure. We have held the Lasix for now given the worsening acute renal failure. We will give the patient Kayexalate 30 grams p.o. times  two doses today and follow his serum potassium. If this does not come down we may need to consider temporary hemodialysis. This was discussed with the patient.  4. Chronic systolic heart failure with acute heart failure exacerbation: His ejection fraction on echo was noted as being 25 to 35%. However, it was better on cardiac catheterization at 40 to 45%. Currently,  he appears to be well compensated. As above, we are holding Lasix at this point in time given his renal insufficiency. He also appears to have had an acute myocardial infarction. Awaiting further cardiology input regarding the case.   I would like to thank Dr. Dava Najjar for this kind referral. Further plan as patient progresses.   ____________________________ Lennox Pippins, MD mnl:bjt D: 05/22/2011 10:22:36 ET T: 05/22/2011 11:02:53 ET JOB#: [id number]  cc: Lennox Pippins, MD, <Dictator> Ria Comment Dreyson Mishkin MD ELECTRONICALLY SIGNED 06/12/2011 22:13

## 2014-09-12 NOTE — Discharge Summary (Signed)
PATIENT NAME:  [name], [name] MR#:  [id number] DATE OF BIRTH:  04/20/1941  DATE OF ADMISSION:  05/21/2011 DATE OF DISCHARGE:  05/24/2011  ATTENDING PHYSICIAN: Dr. Juel BurrowMasoud  HISTORY OF PRESENT ILLNESS: [name] is a 74 year old man who was admitted into the hospital with congestive heart failure, hypertension, borderline diabetes and gastroesophageal reflux disease. Patient was also found to an elevated blood pressure and has a history of non-ST elevated myocardial infarction recently. His ejection fraction is 40% to 45%. RCA has a 75% blockage. LAD has 50% blockage.   HOSPITAL COURSE: Patient was admitted into the hospital with congestive heart failure. Lab data revealed BNP of 34,595, creatinine 3.0, potassium 6.0, GFR 27. RBC 4.09, creatinine in the urine 145.7. Urinalysis unremarkable. Patient was treated intensively medically. Echocardiogram was performed which revealed significant aortic valve disease with aortic stenosis and aortic regurgitation although his risk factors with coronary artery disease and renal failure were high but it was thought that he may benefit from aortic valve replacement, so [facility].   ____________________________ Corky DownsJaved Trong Gosling, MD jm:cms D: 06/20/2011 16:21:55 ET T: 06/21/2011 10:46:44 ET JOB#: [id number]  cc: Corky DownsJaved Amariah Kierstead, MD, <Dictator> Corky DownsJAVED Kenitha Glendinning MD ELECTRONICALLY SIGNED 06/24/2011 21:09

## 2014-11-02 ENCOUNTER — Other Ambulatory Visit: Payer: Self-pay

## 2014-11-02 ENCOUNTER — Emergency Department
Admission: EM | Admit: 2014-11-02 | Discharge: 2014-11-02 | Disposition: A | Discharge status: Home / Self Care | Payer: Medicare Other | Attending: Emergency Medicine | Admitting: Emergency Medicine

## 2014-11-02 ENCOUNTER — Encounter: Admission: RE | Payer: Self-pay | Source: Ambulatory Visit

## 2014-11-02 ENCOUNTER — Ambulatory Visit: Admission: RE | Admit: 2014-11-02 | Payer: Self-pay | Source: Ambulatory Visit | Admitting: Gastroenterology

## 2014-11-02 ENCOUNTER — Encounter: Payer: Self-pay | Admitting: Medical Oncology

## 2014-11-02 DIAGNOSIS — Z79899 Other long term (current) drug therapy: Secondary | ICD-10-CM | POA: Insufficient documentation

## 2014-11-02 DIAGNOSIS — Z7901 Long term (current) use of anticoagulants: Secondary | ICD-10-CM | POA: Diagnosis not present

## 2014-11-02 DIAGNOSIS — Z7902 Long term (current) use of antithrombotics/antiplatelets: Secondary | ICD-10-CM | POA: Insufficient documentation

## 2014-11-02 DIAGNOSIS — R0789 Other chest pain: Secondary | ICD-10-CM | POA: Insufficient documentation

## 2014-11-02 DIAGNOSIS — I10 Essential (primary) hypertension: Secondary | ICD-10-CM | POA: Diagnosis not present

## 2014-11-02 HISTORY — DX: Unspecified atrial fibrillation: I48.91

## 2014-11-02 HISTORY — DX: Gastro-esophageal reflux disease without esophagitis: K21.9

## 2014-11-02 HISTORY — DX: Unspecified dementia, unspecified severity, without behavioral disturbance, psychotic disturbance, mood disturbance, and anxiety: F03.90

## 2014-11-02 HISTORY — DX: Essential (primary) hypertension: I10

## 2014-11-02 HISTORY — DX: Pure hypercholesterolemia, unspecified: E78.00

## 2014-11-02 LAB — CBC
HEMATOCRIT: 38.4 % — AB (ref 40.0–52.0)
Hemoglobin: 12.5 g/dL — ABNORMAL LOW (ref 13.0–18.0)
MCH: 23.5 pg — ABNORMAL LOW (ref 26.0–34.0)
MCHC: 32.6 g/dL (ref 32.0–36.0)
MCV: 71.9 fL — ABNORMAL LOW (ref 80.0–100.0)
PLATELETS: 150 10*3/uL (ref 150–440)
RBC: 5.35 MIL/uL (ref 4.40–5.90)
RDW: 18.3 % — AB (ref 11.5–14.5)
WBC: 7.6 10*3/uL (ref 3.8–10.6)

## 2014-11-02 LAB — BASIC METABOLIC PANEL
Anion gap: 8 (ref 5–15)
BUN: 11 mg/dL (ref 6–20)
CALCIUM: 8.6 mg/dL — AB (ref 8.9–10.3)
CO2: 28 mmol/L (ref 22–32)
CREATININE: 1.35 mg/dL — AB (ref 0.61–1.24)
Chloride: 106 mmol/L (ref 101–111)
GFR calc Af Amer: 59 mL/min — ABNORMAL LOW (ref >60)
GFR calc non Af Amer: 50 mL/min — ABNORMAL LOW (ref >60)
Glucose, Bld: 119 mg/dL — ABNORMAL HIGH (ref 65–99)
Potassium: 3.2 mmol/L — ABNORMAL LOW (ref 3.5–5.1)
Sodium: 142 mmol/L (ref 135–145)

## 2014-11-02 LAB — TROPONIN I

## 2014-11-02 SURGERY — COLONOSCOPY WITH PROPOFOL
Anesthesia: General

## 2014-11-02 NOTE — Discharge Instructions (Signed)

## 2014-11-02 NOTE — ED Notes (Signed)
Pt presents with c/o left sided chest pain that began this am, also reports dizziness.

## 2014-11-02 NOTE — ED Notes (Signed)
Took over care of patient at 11AM.  Resting comfortably. Given warm blankets. Family at bedside. Awaiting results of testing.

## 2014-11-02 NOTE — ED Provider Notes (Signed)
[facility] Emergency Department Provider Note  ____________________________________________  Time seen: 10:45 AM  I have reviewed the triage vital signs and the nursing notes.   HISTORY  Chief Complaint Chest Pain    HPI [name] is a 74 y.o. male who is in his usual state of health this morning when around 7:00 AM he had a sudden episode of left chest pain. The pain is described as electric and sharp, lasting for less than 1 minute, nonradiating without any associated nausea vomiting diaphoresis dizziness or shortness of breath. There are no aggravating or alleviating factors. He's never had pain like this before. He was just sitting at rest when it happened. No recent trauma falls or injuries. No travel recently hospitalizations or fractures. Patient reports he does take Coumadin and has been compliant with this medicine.     Past Medical History  Diagnosis Date  . A-fib   . Dementia   . Hypertension   . High cholesterol   . GERD (gastroesophageal reflux disease)     There are no active problems to display for this patient.   Past Surgical History  Procedure Laterality Date  . Aortic valve replacement    . Cardiac bypass       Current Outpatient Rx  Name  Route  Sig  Dispense  Refill  . amiodarone (PACERONE) 200 MG tablet   Oral   Take 200 mg by mouth daily.         Marland Kitchen atorvastatin (LIPITOR) 40 MG tablet   Oral   Take 40 mg by mouth daily at 6 PM.         . clopidogrel (PLAVIX) 75 MG tablet   Oral   Take 75 mg by mouth daily.         Marland Kitchen dexlansoprazole (DEXILANT) 60 MG capsule   Oral   Take 60 mg by mouth daily.         . furosemide (LASIX) 20 MG tablet   Oral   Take 20 mg by mouth daily.         Marland Kitchen gabapentin (NEURONTIN) 100 MG capsule   Oral   Take 100 mg by mouth 3 (three) times daily.         Marland Kitchen lisinopril (PRINIVIL,ZESTRIL) 5 MG tablet   Oral   Take 5 mg by mouth daily.         . pantoprazole (PROTONIX) 40  MG tablet   Oral   Take 40 mg by mouth daily.         Marland Kitchen warfarin (COUMADIN) 2 MG tablet   Oral   Take 2 mg by mouth daily at 6 PM.         . warfarin (COUMADIN) 2.5 MG tablet   Oral   Take 2.5 mg by mouth daily at 6 PM.           Allergies Review of patient's allergies indicates no known allergies.  No family history on file.  Social History History  Substance Use Topics  . Smoking status: Never Smoker   . Smokeless tobacco: Current User  . Alcohol Use: No    Review of Systems  Constitutional: No fever or chills. No weight changes Eyes:No blurry vision or double vision.  ENT: No sore throat. Cardiovascular: Chest pain as above. Respiratory: No dyspnea or cough. Gastrointestinal: Negative for abdominal pain, vomiting and diarrhea.  No BRBPR or melena. Genitourinary: Negative for dysuria, urinary retention, bloody urine, or difficulty urinating. Musculoskeletal: Negative for back  pain. No joint swelling or pain. Skin: Negative for rash. Neurological: Negative for headaches, focal weakness or numbness. Psychiatric:No anxiety or depression.   Endocrine:No hot/cold intolerance, changes in energy, or sleep difficulty.  10-point ROS otherwise negative.  ____________________________________________   PHYSICAL EXAM:  VITAL SIGNS: ED Triage Vitals  Enc Vitals Group     BP 11/02/14 1100 181/83 mmHg     Pulse Rate 11/02/14 1100 66     Resp 11/02/14 1100 15     Temp --      Temp src --      SpO2 11/02/14 1100 99 %     Weight 11/02/14 1022 180 lb (81.647 kg)     Height 11/02/14 1022  (1.676 m)     Head Cir --      Peak Flow --      Pain Score 11/02/14 1023 5     Pain Loc --      Pain Edu? --      Excl. in GC? --      Constitutional: Alert and oriented. Well appearing and in no distress. Eyes: No scleral icterus. No conjunctival pallor. PERRL. EOMI ENT   Head: Normocephalic and atraumatic.   Nose: No congestion/rhinnorhea. No septal  hematoma   Mouth/Throat: MMM, no pharyngeal erythema. No peritonsillar mass. No uvula shift.   Neck: No stridor. No SubQ emphysema. No meningismus. Hematological/Lymphatic/Immunilogical: No cervical lymphadenopathy. Cardiovascular: RRR. Normal and symmetric distal pulses are present in all extremities. No murmurs, rubs, or gallops. Respiratory: Normal respiratory effort without tachypnea nor retractions. Breath sounds are clear and equal bilaterally. No wheezes/rales/rhonchi. Left inferior lateral chest wall point tender over the area of the 10th or 11th rib. This reproduces the patient's pain. There is no instability crepitus deformity or step-off. Gastrointestinal: Soft and nontender. No distention. There is no CVA tenderness.  No rebound, rigidity, or guarding. Genitourinary: deferred Musculoskeletal: Nontender with normal range of motion in all extremities. No joint effusions.  No lower extremity tenderness.  No edema. Neurologic:   Normal speech and language.  CN 2-10 normal. Motor grossly intact. No pronator drift.  Normal gait. No gross focal neurologic deficits are appreciated.  Skin:  Skin is warm, dry and intact. No rash noted.  No petechiae, purpura, or bullae. Psychiatric: Mood and affect are normal. Speech and behavior are normal. Patient exhibits appropriate insight and judgment.  ____________________________________________    LABS (pertinent positives/negatives) (all labs ordered are listed, but only abnormal results are displayed) Labs Reviewed  CBC - Abnormal; Notable for the following:    Hemoglobin 12.5 (*)    HCT 38.4 (*)    MCV 71.9 (*)    MCH 23.5 (*)    RDW 18.3 (*)    All other components within normal limits  BASIC METABOLIC PANEL - Abnormal; Notable for the following:    Potassium 3.2 (*)    Glucose, Bld 119 (*)    Creatinine, Ser 1.35 (*)    Calcium 8.6 (*)    GFR calc non Af Amer 50 (*)    GFR calc Af Amer 59 (*)    All other components within  normal limits  TROPONIN I   ____________________________________________   EKG  Interpreted by me Sinus rhythm rate of 79, prolonged PR interval with first-degree AV block. Left axis wide QRS at 158 ms, left bundle branch block. Normal ST segments. There are T-wave inversions in 1 and aVL with appropriate discordance to the QRS axis.  ____________________________________________    RADIOLOGY  ____________________________________________   PROCEDURES  ____________________________________________   INITIAL IMPRESSION / ASSESSMENT AND PLAN / ED COURSE  Pertinent labs & imaging results that were available during my care of the patient were reviewed by me and considered in my medical decision making (see chart for details).  Patient's evaluation is consistent with musculoskeletal chest wall pain. Low suspicion of ACS PE TAD pneumothorax sepsis or carditis. We'll discharge the patient home to take NSAIDs as needed. Initial lab work all unremarkable.  ____________________________________________   FINAL CLINICAL IMPRESSION(S) / ED DIAGNOSES  Final diagnoses:  Acute chest wall pain      Sharman Cheek, MD 11/02/14 1207

## 2015-03-12 ENCOUNTER — Observation Stay
Admission: EM | Admit: 2015-03-12 | Discharge: 2015-03-13 | Disposition: A | Discharge status: Home / Self Care | Payer: Medicare Other | Attending: Specialist | Admitting: Specialist

## 2015-03-12 ENCOUNTER — Emergency Department: Payer: Medicare Other

## 2015-03-12 ENCOUNTER — Observation Stay (HOSPITAL_BASED_OUTPATIENT_CLINIC_OR_DEPARTMENT_OTHER)
Admit: 2015-03-12 | Discharge: 2015-03-12 | Disposition: A | Discharge status: Home / Self Care | Payer: Medicare Other | Attending: Internal Medicine | Admitting: Internal Medicine

## 2015-03-12 ENCOUNTER — Encounter: Payer: Self-pay | Admitting: *Deleted

## 2015-03-12 ENCOUNTER — Observation Stay: Payer: Medicare Other

## 2015-03-12 DIAGNOSIS — Z8249 Family history of ischemic heart disease and other diseases of the circulatory system: Secondary | ICD-10-CM | POA: Insufficient documentation

## 2015-03-12 DIAGNOSIS — I5022 Chronic systolic (congestive) heart failure: Secondary | ICD-10-CM | POA: Insufficient documentation

## 2015-03-12 DIAGNOSIS — I4891 Unspecified atrial fibrillation: Secondary | ICD-10-CM | POA: Diagnosis not present

## 2015-03-12 DIAGNOSIS — E7800 Pure hypercholesterolemia, unspecified: Secondary | ICD-10-CM | POA: Diagnosis not present

## 2015-03-12 DIAGNOSIS — Z79899 Other long term (current) drug therapy: Secondary | ICD-10-CM | POA: Insufficient documentation

## 2015-03-12 DIAGNOSIS — Z809 Family history of malignant neoplasm, unspecified: Secondary | ICD-10-CM | POA: Diagnosis not present

## 2015-03-12 DIAGNOSIS — R0602 Shortness of breath: Secondary | ICD-10-CM | POA: Insufficient documentation

## 2015-03-12 DIAGNOSIS — R079 Chest pain, unspecified: Secondary | ICD-10-CM

## 2015-03-12 DIAGNOSIS — I10 Essential (primary) hypertension: Secondary | ICD-10-CM | POA: Diagnosis not present

## 2015-03-12 DIAGNOSIS — Z952 Presence of prosthetic heart valve: Secondary | ICD-10-CM | POA: Insufficient documentation

## 2015-03-12 DIAGNOSIS — R748 Abnormal levels of other serum enzymes: Secondary | ICD-10-CM | POA: Insufficient documentation

## 2015-03-12 DIAGNOSIS — I2510 Atherosclerotic heart disease of native coronary artery without angina pectoris: Secondary | ICD-10-CM | POA: Insufficient documentation

## 2015-03-12 DIAGNOSIS — K8020 Calculus of gallbladder without cholecystitis without obstruction: Secondary | ICD-10-CM | POA: Diagnosis not present

## 2015-03-12 DIAGNOSIS — Z7901 Long term (current) use of anticoagulants: Secondary | ICD-10-CM | POA: Insufficient documentation

## 2015-03-12 DIAGNOSIS — K219 Gastro-esophageal reflux disease without esophagitis: Secondary | ICD-10-CM | POA: Insufficient documentation

## 2015-03-12 DIAGNOSIS — R42 Dizziness and giddiness: Secondary | ICD-10-CM | POA: Insufficient documentation

## 2015-03-12 DIAGNOSIS — R1011 Right upper quadrant pain: Secondary | ICD-10-CM | POA: Insufficient documentation

## 2015-03-12 DIAGNOSIS — Z23 Encounter for immunization: Secondary | ICD-10-CM | POA: Diagnosis not present

## 2015-03-12 DIAGNOSIS — F0390 Unspecified dementia without behavioral disturbance: Secondary | ICD-10-CM | POA: Diagnosis not present

## 2015-03-12 DIAGNOSIS — G8929 Other chronic pain: Secondary | ICD-10-CM | POA: Diagnosis not present

## 2015-03-12 DIAGNOSIS — R1012 Left upper quadrant pain: Secondary | ICD-10-CM | POA: Insufficient documentation

## 2015-03-12 DIAGNOSIS — Z951 Presence of aortocoronary bypass graft: Secondary | ICD-10-CM | POA: Diagnosis not present

## 2015-03-12 HISTORY — DX: Gastro-esophageal reflux disease without esophagitis: K21.9

## 2015-03-12 LAB — HEPATIC FUNCTION PANEL
ALBUMIN: 3.2 g/dL — AB (ref 3.5–5.0)
ALK PHOS: 242 U/L — AB (ref 38–126)
ALT: 59 U/L (ref 17–63)
AST: 89 U/L — AB (ref 15–41)
BILIRUBIN TOTAL: 1.3 mg/dL — AB (ref 0.3–1.2)
Bilirubin, Direct: 0.4 mg/dL (ref 0.1–0.5)
Indirect Bilirubin: 0.9 mg/dL (ref 0.3–0.9)
TOTAL PROTEIN: 6.9 g/dL (ref 6.5–8.1)

## 2015-03-12 LAB — LIPASE, BLOOD: Lipase: 31 U/L (ref 11–51)

## 2015-03-12 LAB — CBC WITH DIFFERENTIAL/PLATELET
Basophils Absolute: 0.1 10*3/uL (ref 0–0.1)
Basophils Relative: 1 %
EOS PCT: 2 %
Eosinophils Absolute: 0.2 10*3/uL (ref 0–0.7)
HCT: 34.6 % — ABNORMAL LOW (ref 40.0–52.0)
HEMOGLOBIN: 11 g/dL — AB (ref 13.0–18.0)
LYMPHS ABS: 0.9 10*3/uL — AB (ref 1.0–3.6)
Lymphocytes Relative: 8 %
MCH: 23.1 pg — ABNORMAL LOW (ref 26.0–34.0)
MCHC: 31.7 g/dL — ABNORMAL LOW (ref 32.0–36.0)
MCV: 72.9 fL — AB (ref 80.0–100.0)
Monocytes Absolute: 0.8 10*3/uL (ref 0.2–1.0)
Monocytes Relative: 7 %
Neutro Abs: 9.5 10*3/uL — ABNORMAL HIGH (ref 1.4–6.5)
Neutrophils Relative %: 82 %
PLATELETS: 162 10*3/uL (ref 150–440)
RBC: 4.75 MIL/uL (ref 4.40–5.90)
RDW: 18.8 % — ABNORMAL HIGH (ref 11.5–14.5)
WBC: 11.5 10*3/uL — AB (ref 3.8–10.6)

## 2015-03-12 LAB — URINALYSIS COMPLETE WITH MICROSCOPIC (ARMC ONLY)
BACTERIA UA: NONE SEEN
Bilirubin Urine: NEGATIVE
Glucose, UA: NEGATIVE mg/dL
Hgb urine dipstick: NEGATIVE
KETONES UR: NEGATIVE mg/dL
LEUKOCYTES UA: NEGATIVE
Nitrite: NEGATIVE
PH: 6 (ref 5.0–8.0)
Protein, ur: NEGATIVE mg/dL
SPECIFIC GRAVITY, URINE: 1.005 (ref 1.005–1.030)

## 2015-03-12 LAB — BASIC METABOLIC PANEL
ANION GAP: 7 (ref 5–15)
BUN: 11 mg/dL (ref 6–20)
CO2: 27 mmol/L (ref 22–32)
Calcium: 8.5 mg/dL — ABNORMAL LOW (ref 8.9–10.3)
Chloride: 107 mmol/L (ref 101–111)
Creatinine, Ser: 1.42 mg/dL — ABNORMAL HIGH (ref 0.61–1.24)
GFR calc Af Amer: 55 mL/min — ABNORMAL LOW (ref >60)
GFR calc non Af Amer: 47 mL/min — ABNORMAL LOW (ref >60)
Glucose, Bld: 140 mg/dL — ABNORMAL HIGH (ref 65–99)
POTASSIUM: 3.6 mmol/L (ref 3.5–5.1)
Sodium: 141 mmol/L (ref 135–145)

## 2015-03-12 LAB — PROTIME-INR
INR: 2.23
Prothrombin Time: 24.8 seconds — ABNORMAL HIGH (ref 11.4–15.0)

## 2015-03-12 LAB — TROPONIN I
Troponin I: <0.03 ng/mL (ref <0.031)
Troponin I: <0.03 ng/mL (ref <0.031)
Troponin I: <0.03 ng/mL (ref <0.031)

## 2015-03-12 LAB — CKMB (ARMC ONLY)
CK, MB: 1.4 ng/mL (ref 0.5–5.0)
CK, MB: 1.6 ng/mL (ref 0.5–5.0)

## 2015-03-12 MED ORDER — ISOSORBIDE DINITRATE 10 MG PO TABS
20.0000 mg | ORAL_TABLET | Freq: Every day | ORAL | Status: DC
  Administered 2015-03-13: 20 mg via ORAL
  Filled 2015-03-12: qty 2

## 2015-03-12 MED ORDER — SODIUM CHLORIDE 0.9 % IJ SOLN
3.0000 mL | Freq: Two times a day (BID) | INTRAMUSCULAR | Status: DC
  Administered 2015-03-12 (×2): 3 mL via INTRAVENOUS

## 2015-03-12 MED ORDER — INFLUENZA VAC SPLIT QUAD 0.5 ML IM SUSY
0.5000 mL | PREFILLED_SYRINGE | INTRAMUSCULAR | Status: AC
  Administered 2015-03-13: 0.5 mL via INTRAMUSCULAR
  Filled 2015-03-12: qty 0.5

## 2015-03-12 MED ORDER — CALCIUM CARBONATE ANTACID 500 MG PO CHEW
1.0000 | CHEWABLE_TABLET | Freq: Every day | ORAL | Status: DC
  Administered 2015-03-13: 200 mg via ORAL
  Filled 2015-03-12: qty 1

## 2015-03-12 MED ORDER — CLOPIDOGREL BISULFATE 75 MG PO TABS
75.0000 mg | ORAL_TABLET | Freq: Every day | ORAL | Status: DC
  Administered 2015-03-13: 75 mg via ORAL
  Filled 2015-03-12: qty 1

## 2015-03-12 MED ORDER — WARFARIN SODIUM 1 MG PO TABS: 1.0000 mg | ORAL_TABLET | ORAL | Status: DC

## 2015-03-12 MED ORDER — PANTOPRAZOLE SODIUM 40 MG PO TBEC
40.0000 mg | DELAYED_RELEASE_TABLET | Freq: Two times a day (BID) | ORAL | Status: DC
  Administered 2015-03-12 – 2015-03-13 (×2): 40 mg via ORAL
  Filled 2015-03-12 (×2): qty 1

## 2015-03-12 MED ORDER — PNEUMOCOCCAL VAC POLYVALENT 25 MCG/0.5ML IJ INJ
0.5000 mL | INJECTION | INTRAMUSCULAR | Status: AC
  Administered 2015-03-13: 0.5 mL via INTRAMUSCULAR
  Filled 2015-03-12: qty 0.5

## 2015-03-12 MED ORDER — AMIODARONE HCL 200 MG PO TABS
200.0000 mg | ORAL_TABLET | Freq: Every day | ORAL | Status: DC
  Administered 2015-03-13: 200 mg via ORAL
  Filled 2015-03-12: qty 1

## 2015-03-12 MED ORDER — ACETAMINOPHEN 325 MG PO TABS: 650.0000 mg | ORAL_TABLET | Freq: Four times a day (QID) | ORAL | Status: DC | PRN

## 2015-03-12 MED ORDER — ACETAMINOPHEN 650 MG RE SUPP: 650.0000 mg | Freq: Four times a day (QID) | RECTAL | Status: DC | PRN

## 2015-03-12 MED ORDER — ONDANSETRON HCL 4 MG/2ML IJ SOLN: 4.0000 mg | Freq: Four times a day (QID) | INTRAMUSCULAR | Status: DC | PRN

## 2015-03-12 MED ORDER — LISINOPRIL 10 MG PO TABS
10.0000 mg | ORAL_TABLET | Freq: Every day | ORAL | Status: DC
  Administered 2015-03-13: 10 mg via ORAL
  Filled 2015-03-12: qty 1

## 2015-03-12 MED ORDER — ALUM & MAG HYDROXIDE-SIMETH 200-200-20 MG/5ML PO SUSP: 30.0000 mL | Freq: Four times a day (QID) | ORAL | Status: DC | PRN

## 2015-03-12 MED ORDER — ONDANSETRON HCL 4 MG PO TABS: 4.0000 mg | ORAL_TABLET | Freq: Four times a day (QID) | ORAL | Status: DC | PRN

## 2015-03-12 MED ORDER — GABAPENTIN 300 MG PO CAPS
300.0000 mg | ORAL_CAPSULE | Freq: Every day | ORAL | Status: DC
  Administered 2015-03-12: 300 mg via ORAL
  Filled 2015-03-12: qty 1

## 2015-03-12 MED ORDER — ROSUVASTATIN CALCIUM 10 MG PO TABS
10.0000 mg | ORAL_TABLET | Freq: Every day | ORAL | Status: DC
  Administered 2015-03-13: 10 mg via ORAL
  Filled 2015-03-12: qty 1

## 2015-03-12 MED ORDER — WARFARIN SODIUM 5 MG PO TABS
2.5000 mg | ORAL_TABLET | Freq: Every day | ORAL | Status: DC
  Administered 2015-03-12: 2.5 mg via ORAL
  Filled 2015-03-12: qty 1

## 2015-03-12 NOTE — H&P (Addendum)
[facility] - [place] at [facility]   PATIENT NAME: [name]    MR#:  [id number]  DATE OF BIRTH:  02/08/1941  DATE OF ADMISSION:  03/12/2015  PRIMARY CARE PHYSICIAN: Corky Downs, MD   REQUESTING/REFERRING PHYSICIAN: Dr. Harlene Ramus  CHIEF COMPLAINT:   Chief Complaint  Patient presents with  . Dizziness   And chest pain  HISTORY OF PRESENT ILLNESS:  [name]  is a 74 y.o. male with a known history of atrial fibrillation on Coumadin, mild dementia, hypertension significant acid reflux and history of hepatic valve replacement surgery presents from home secondary to an episode of dizziness and chest tightness that happened this morning. Daughter gives most of the history, she is at bedside. She is patient's healthcare power of attorney. Patient lives at home by himself, active at baseline though has some cognitive deficits. He called his daughter this morning complaining of an episode of dizziness stent chest tightness that started this morning. Is done its own. He walks every day if weather permits, but denies any recent chest pain on exertion. He has chronic left upper quadrant abdominal pain and significant heartburn symptoms, today his chest tightness was different than his chronic abdominal pain. He denies any nausea, vomiting or diaphoresis. His CABG was 4 years ago, since then hasn't had any chest pains or stents put in. No recent stress test according to daughter. Troponin is negative, no new EKG changes. Patient is being admitted for observation. Currently no chest pain or dizziness at this time.   PAST MEDICAL HISTORY:   Past Medical History  Diagnosis Date  . A-fib (HCC)   . Dementia   . Hypertension   . High cholesterol   . GERD (gastroesophageal reflux disease)   . Acid reflux     PAST SURGICAL HISTORY:   Past Surgical History  Procedure Laterality Date  . Aortic valve replacement    . Cardiac bypass     . Coronary artery bypass  graft      SOCIAL HISTORY:   Social History  Substance Use Topics  . Smoking status: Never Smoker   . Smokeless tobacco: Current User    Types: Snuff, Chew  . Alcohol Use: No    FAMILY HISTORY:   Family History  Problem Relation Age of Onset  . Cancer Mother   . Hypertension Father   . CAD Father     DRUG ALLERGIES:  No Known Allergies  REVIEW OF SYSTEMS:   Review of Systems  Constitutional: Negative for fever, chills, weight loss and malaise/fatigue.  HENT: Negative for ear discharge, ear pain, hearing loss, nosebleeds and tinnitus.   Eyes: Negative for blurred vision, double vision and photophobia.  Respiratory: Negative for cough, hemoptysis, shortness of breath and wheezing.   Cardiovascular: Positive for chest pain. Negative for palpitations, orthopnea and leg swelling.  Gastrointestinal: Positive for abdominal pain. Negative for heartburn, nausea, vomiting, diarrhea, constipation and melena.  Genitourinary: Negative for dysuria, urgency, frequency and hematuria.  Musculoskeletal: Negative for myalgias, back pain and neck pain.  Skin: Negative for rash.  Neurological: Positive for dizziness. Negative for tingling, tremors, sensory change, speech change, focal weakness, weakness and headaches.  Endo/Heme/Allergies: Does not bruise/bleed easily.  Psychiatric/Behavioral: Negative for depression.    MEDICATIONS AT HOME:   Prior to Admission medications   Medication Sig Start Date End Date Taking? Authorizing Provider  amiodarone (PACERONE) 200 MG tablet Take 200 mg by mouth daily.    Historical Provider, MD  atorvastatin (  LIPITOR) 40 MG tablet Take 40 mg by mouth daily at 6 PM.    Historical Provider, MD  clopidogrel (PLAVIX) 75 MG tablet Take 75 mg by mouth daily.    Historical Provider, MD  dexlansoprazole (DEXILANT) 60 MG capsule Take 60 mg by mouth daily.    Historical Provider, MD  furosemide (LASIX) 20 MG tablet Take 20 mg by mouth daily.    Historical  Provider, MD  gabapentin (NEURONTIN) 100 MG capsule Take 100 mg by mouth 3 (three) times daily.    Historical Provider, MD  lisinopril (PRINIVIL,ZESTRIL) 5 MG tablet Take 5 mg by mouth daily.    Historical Provider, MD  pantoprazole (PROTONIX) 40 MG tablet Take 40 mg by mouth daily.    Historical Provider, MD  warfarin (COUMADIN) 2 MG tablet Take 2 mg by mouth daily at 6 PM.    Historical Provider, MD  warfarin (COUMADIN) 2.5 MG tablet Take 2.5 mg by mouth daily at 6 PM.    Historical Provider, MD      VITAL SIGNS:  Blood pressure 162/81, pulse 85, temperature 97.5 F (36.4 C), temperature source Oral, resp. rate 20, height 5\' 6"  (1.676 m), weight 79.379 kg (175 lb), SpO2 95 %.  PHYSICAL EXAMINATION:   Physical Exam  GENERAL:  74 y.o.-year-old patient lying in the bed with no acute distress. Disshelved appearing EYES: Pupils equal, round, reactive to light and accommodation. No scleral icterus. Extraocular muscles intact.  HEENT: Head atraumatic, normocephalic. Oropharynx and nasopharynx clear. edentulous NECK:  Supple, no jugular venous distention. No thyroid enlargement, no tenderness.  LUNGS: Normal breath sounds bilaterally, no wheezing, rales,rhonchi or crepitation. No use of accessory muscles of respiration.  CARDIOVASCULAR: S1, S2 normal. Loud 3/6 systolic murmur present in aortic area, No rubs, or gallops.  ABDOMEN: Soft, nontender, nondistended. Discomfort in right upper quadrant on palpation. Negative Murphy's sign. Bowel sounds present. No organomegaly or mass.  EXTREMITIES: No pedal edema, cyanosis, or clubbing.  NEUROLOGIC: Cranial nerves II through XII are intact. Muscle strength 5/5 in all extremities. Sensation intact. Gait not checked.  PSYCHIATRIC: The patient is alert and oriented x 2  SKIN: No obvious rash, lesion, or ulcer.   LABORATORY PANEL:   CBC  Recent Labs Lab 03/12/15 1024  WBC 11.5*  HGB 11.0*  HCT 34.6*  PLT 162    ------------------------------------------------------------------------------------------------------------------  Chemistries   Recent Labs Lab 03/12/15 0940  NA 141  K 3.6  CL 107  CO2 27  GLUCOSE 140*  BUN 11  CREATININE 1.42*  CALCIUM 8.5*  AST 89*  ALT 59  ALKPHOS 242*  BILITOT 1.3*   ------------------------------------------------------------------------------------------------------------------  Cardiac Enzymes  Recent Labs Lab 03/12/15 0940  TROPONINI <0.03   ------------------------------------------------------------------------------------------------------------------  RADIOLOGY:  Dg Chest 2 View  03/12/2015  CLINICAL DATA:  Dizziness this morning with chest tightness, hypertension, dementia, atrial fibrillation, post CABG and AVR EXAM: CHEST  2 VIEW COMPARISON:  07/06/2014 FINDINGS: Borderline enlargement of cardiac silhouette post CABG and AVR. Mediastinal contours and pulmonary vascularity normal. Chronic linear subsegmental atelectasis at both lung bases versus scarring. No definite acute infiltrate, pleural effusion or pneumothorax. No acute osseous findings. IMPRESSION: Post CABG and AVR. Chronic bibasilar atelectasis versus scarring. Electronically Signed   By: Ulyses SouthwardMark  Boles M.D.   On: 03/12/2015 10:29    EKG:   Orders placed or performed during the hospital encounter of 03/12/15  . EKG 12-Lead  . EKG 12-Lead  . ED EKG  . ED EKG    IMPRESSION AND PLAN:  [name]  is a 74 y.o. male with a known history of atrial fibrillation on Coumadin, mild dementia, hypertension significant acid reflux and history of hepatic valve replacement surgery presents from home secondary to an episode of dizziness and chest tightness that happened this morning.  #1 chest pain-could be GI reflux versus stable angina. -Patient is chest pain-free at this time, no new EKG changes. -Troponin is negative. Monitor on telemetry, admit under observation. Recycle  troponins -Echocardiogram -Continue his cardiac medications. Patient on Plavix and Coumadin, cannot take aspirin due to significant reflux problems. -Monitor for now. Hold off on stress test for now. If rules out, can be done as an outpatient  #2 Acid reflux-significant acid reflux, but intermittent left upper quadrant pain with heartburn sensation. -Takes both Nexium and Dexilant both as outpatient with minimal relief. -We'll get a GI consult as daughter is very concerned, likely EGD can be set up as an outpatient as patient on Plavix and also Coumadin. -PPI twice a day at this time  #3 Right upper quadrant pain-with elevated alkaline phosphatase, chronic right upper quadrant pain. -We'll get an ultrasound of his abdomen to look for gallstones.  #4 coronary artery disease-plan as above. Currently no chest pain. -Will be monitored on telemetry, continue cardiac medications. -His bypass Surgery was almost 4 years ago. Patient on Plavix, statin at this time. -Also a known history of congestive heart failure, mild systolic dysfunction with last known ejection fraction of 45%. We'll hold off his Lasix at this time as does not appear to be volume overloaded -Follow-up echocardiogram  #5 atrial fibrillation-rate controlled, in sinus rhythm. -Continue amiodarone. -On warfarin for anticoagulation. INR is therapeutic  #6 hypertension-patient on lisinopril at home. Continue for now.  #7 DVT prophylaxis-patient already on Coumadin and INR is therapeutic  Daughter is the healthcare power of attorney. Patient cannot read or write and has cognitive issues. Any significant updates, daughter would like to be updated.   All the records are reviewed and case discussed with ED provider. Management plans discussed with the patient, family and they are in agreement.  CODE STATUS: Full code  TOTAL TIME TAKING CARE OF THIS PATIENT: 50 minutes.    Enid Baas M.D on 03/12/2015 at 11:42  AM  Between 7am to 6pm - Pager - [phone]  After 6pm go to [email or web address] - password [email or web address] [facility]  [facility]  Office  [phone]  CC: Primary care physician; Corky Downs, MD

## 2015-03-12 NOTE — Progress Notes (Signed)
*  PRELIMINARY RESULTS* Echocardiogram 2D Echocardiogram has been performed.  [name] 03/12/2015, 3:16 PM

## 2015-03-12 NOTE — ED Notes (Signed)
Pt reports becoming dizzy this morning with chest tightness

## 2015-03-12 NOTE — Progress Notes (Signed)
Patient admitted to unit. Oriented to room, call bell, and staff. Bed in lowest position. Fall safety plan reviewed. Full assessment to Epic. Skin assessment verified with Dorita FrayVictoria Jones RN. Telemetry box verification with tele clerk, Dois DavenportSandra. No complaints at this time. Will continue to monitor.

## 2015-03-12 NOTE — Progress Notes (Signed)
US called saying we would need to bring patient down for US Abd as they have nobody for transport. No orderly in at this time. Per Nursing Supervisor, an orderly should be coming in to work at [employer]3pm. Patient is currently getting bedside Echo done. Will see if tech can take patient down afterwards.

## 2015-03-12 NOTE — ED Provider Notes (Signed)
[facility] Emergency Department Provider Note  ____________________________________________   I have reviewed the triage vital signs and the nursing notes.   HISTORY  Chief Complaint Dizziness    HPI Rexford Prevo is a 74 y.o. male with a history of atrial fibrillation, cardiac bypass, porcine valve replacement 4 years ago, chronic reflux disease on multiple medications for same, on Coumadin for A. fib presents today complaining of having had chest pressure and lightheadedness and shortness of breath today. He is back to baseline at this time. Patient suffers from dementia and is at his baseline mentally. Much of the history is from his daughter. The patient cannot very well characterized the discomfort but he did feel lightheaded he states and had some shortness of breath. Apparently, he has had chest discomfort like this in the recent past and been started on isosorbide for it. It is unclear if he took that this morning. Family states he is not taking extra medication. There is no history of GI bleed.  Past Medical History  Diagnosis Date  . A-fib (HCC)   . Dementia   . Hypertension   . High cholesterol   . GERD (gastroesophageal reflux disease)   . Acid reflux     There are no active problems to display for this patient.   Past Surgical History  Procedure Laterality Date  . Aortic valve replacement    . Cardiac bypass       Current Outpatient Rx  Name  Route  Sig  Dispense  Refill  . amiodarone (PACERONE) 200 MG tablet   Oral   Take 200 mg by mouth daily.         Marland Kitchen atorvastatin (LIPITOR) 40 MG tablet   Oral   Take 40 mg by mouth daily at 6 PM.         . clopidogrel (PLAVIX) 75 MG tablet   Oral   Take 75 mg by mouth daily.         Marland Kitchen dexlansoprazole (DEXILANT) 60 MG capsule   Oral   Take 60 mg by mouth daily.         . furosemide (LASIX) 20 MG tablet   Oral   Take 20 mg by mouth daily.         Marland Kitchen gabapentin (NEURONTIN) 100 MG  capsule   Oral   Take 100 mg by mouth 3 (three) times daily.         Marland Kitchen lisinopril (PRINIVIL,ZESTRIL) 5 MG tablet   Oral   Take 5 mg by mouth daily.         . pantoprazole (PROTONIX) 40 MG tablet   Oral   Take 40 mg by mouth daily.         Marland Kitchen warfarin (COUMADIN) 2 MG tablet   Oral   Take 2 mg by mouth daily at 6 PM.         . warfarin (COUMADIN) 2.5 MG tablet   Oral   Take 2.5 mg by mouth daily at 6 PM.           Allergies Review of patient's allergies indicates no known allergies.  No family history on file.  Social History Social History  Substance Use Topics  . Smoking status: Never Smoker   . Smokeless tobacco: Current User  . Alcohol Use: No    Review of Systems Constitutional: No fever/chills Eyes: No visual changes. ENT: No sore throat. No stiff neck no neck pain Cardiovascular: chest pain. Respiratory: shortness of breath.  Gastrointestinal:   no vomiting.  No diarrhea.  No constipation. Genitourinary: Negative for dysuria. Musculoskeletal: Negative lower extremity swelling Skin: Negative for rash. Neurological: Negative for headaches, focal weakness or numbness. 10-point ROS otherwise negative.  ____________________________________________   PHYSICAL EXAM:  VITAL SIGNS: ED Triage Vitals  Enc Vitals Group     BP 03/12/15 0928 162/81 mmHg     Pulse Rate 03/12/15 0928 85     Resp 03/12/15 0928 20     Temp 03/12/15 0928 97.5 F (36.4 C)     Temp Source 03/12/15 0928 Oral     SpO2 03/12/15 0928 95 %     Weight 03/12/15 0928 175 lb (79.379 kg)     Height 03/12/15 0928 5\' 6"  (1.676 m)     Head Cir --      Peak Flow --      Pain Score --      Pain Loc --      Pain Edu? --      Excl. in GC? --     Constitutional: Alert and oriented to name and place unsure of the year, pleasantly demented. Well appearing and in no acute distress. Eyes: Conjunctivae are normal. PERRL. EOMI. Head: Atraumatic. Nose: No  congestion/rhinnorhea. Mouth/Throat: Mucous membranes are moist.  Oropharynx non-erythematous. Neck: No stridor.   Nontender with no meningismus Cardiovascular: Normal rate, regular rhythm. Grossly normal heart sounds.  Good peripheral circulation. Respiratory: Normal respiratory effort.  No retractions. Lungs CTAB. Gastrointestinal: Soft and nontender. No distention. No guarding no rebound Back:  There is no focal tenderness or step off there is no midline tenderness there are no lesions noted. there is no CVA tenderness Musculoskeletal: No lower extremity tenderness. No joint effusions, no DVT signs strong distal pulses no edema Neurologic:  Normal speech and language. No gross focal neurologic deficits are appreciated.  Skin:  Skin is warm, dry and intact. No rash noted. Psychiatric: Mood and affect are normal. Speech and behavior are normal.  ____________________________________________   LABS (all labs ordered are listed, but only abnormal results are displayed)  Labs Reviewed  BASIC METABOLIC PANEL - Abnormal; Notable for the following:    Glucose, Bld 140 (*)    Creatinine, Ser 1.42 (*)    Calcium 8.5 (*)    GFR calc non Af Amer 47 (*)    GFR calc Af Amer 55 (*)    All other components within normal limits  URINALYSIS COMPLETEWITH MICROSCOPIC ([facility] ONLY) - Abnormal; Notable for the following:    Color, Urine YELLOW (*)    APPearance CLEAR (*)    Squamous Epithelial / LPF 0-5 (*)    All other components within normal limits  PROTIME-INR - Abnormal; Notable for the following:    Prothrombin Time 24.8 (*)    All other components within normal limits  CBC WITH DIFFERENTIAL/PLATELET - Abnormal; Notable for the following:    WBC 11.5 (*)    Hemoglobin 11.0 (*)    HCT 34.6 (*)    MCV 72.9 (*)    MCH 23.1 (*)    MCHC 31.7 (*)    RDW 18.8 (*)    Neutro Abs 9.5 (*)    Lymphs Abs 0.9 (*)    All other components within normal limits  TROPONIN I  HEPATIC FUNCTION PANEL   LIPASE, BLOOD   ____________________________________________  EKG  EKG interpreted by me, sinus rhythm with a first-degree AV block, left axis deviation left bundle branch block, no change from prior ____________________________________________  RADIOLOGY I personally reviewed x-ray ____________________________________________   PROCEDURES  Procedure(s) performed: None  Critical Care performed: None  ____________________________________________   INITIAL IMPRESSION / ASSESSMENT AND PLAN / ED COURSE  Pertinent labs & imaging results that were available during my care of the patient were reviewed by me and considered in my medical decision making (see chart for details).  Patient a symptomatically this time did have some poorly described chest pressure and shortness of breath and lightheadedness earlier today. Family thinks that he may have looked like he was going to fall when he walked although at this time he is back to his baseline. He has no chest pain no shortness of breath and he denies any symptoms at this moment. Abdomen is benign. Given his history likely would benefit from all labs. Patient has not had a stress test in some time it appears. ____________________________________________   FINAL CLINICAL IMPRESSION(S) / ED DIAGNOSES  Final diagnoses:  None     Jeanmarie Plant, MD 03/12/15 1040

## 2015-03-13 DIAGNOSIS — R079 Chest pain, unspecified: Secondary | ICD-10-CM | POA: Diagnosis not present

## 2015-03-13 LAB — TROPONIN I

## 2015-03-13 LAB — CKMB (ARMC ONLY): CK, MB: 1.4 ng/mL (ref 0.5–5.0)

## 2015-03-13 LAB — PROTIME-INR
INR: 2.76
PROTHROMBIN TIME: 29.3 s — AB (ref 11.4–15.0)

## 2015-03-13 NOTE — Discharge Instructions (Addendum)
DIET:  Cardiac diet  DISCHARGE CONDITION:  Stable  ACTIVITY:  Activity as tolerated  OXYGEN:  Home Oxygen: No.   Oxygen Delivery: room air  DISCHARGE LOCATION:  home   If you experience worsening of your admission symptoms, develop shortness of breath, life threatening emergency, suicidal or homicidal thoughts you must seek medical attention immediately by calling 911 or calling your MD immediately  if symptoms less severe.  You Must read complete instructions/literature along with all the possible adverse reactions/side effects for all the Medicines you take and that have been prescribed to you. Take any new Medicines after you have completely understood and accpet all the possible adverse reactions/side effects.   Please note  You were cared for by a hospitalist during your hospital stay. If you have any questions about your discharge medications or the care you received while you were in the hospital after you are discharged, you can call the unit and asked to speak with the hospitalist on call if the hospitalist that took care of you is not available. Once you are discharged, your primary care physician will handle any further medical issues. Please note that NO REFILLS for any discharge medications will be authorized once you are discharged, as it is imperative that you return to your primary care physician (or establish a relationship with a primary care physician if you do not have one) for your aftercare needs so that they can reassess your need for medications and monitor your lab values.   Abdominal Pain, Adult Many things can cause belly (abdominal) pain. Most times, the belly pain is not dangerous. Many cases of belly pain can be watched and treated at home. HOME CARE   Do not take medicines that help you go poop (laxatives) unless told to by your doctor.  Only take medicine as told by your doctor.  Eat or drink as told by your doctor. Your doctor will tell you if you  should be on a special diet. GET HELP IF:  You do not know what is causing your belly pain.  You have belly pain while you are sick to your stomach (nauseous) or have runny poop (diarrhea).  You have pain while you pee or poop.  Your belly pain wakes you up at night.  You have belly pain that gets worse or better when you eat.  You have belly pain that gets worse when you eat fatty foods.  You have a fever. GET HELP RIGHT AWAY IF:   The pain does not go away within 2 hours.  You keep throwing up (vomiting).  The pain changes and is only in the right or left part of the belly.  You have bloody or tarry looking poop. MAKE SURE YOU:   Understand these instructions.  Will watch your condition.  Will get help right away if you are not doing well or get worse.   This information is not intended to replace advice given to you by your health care provider. Make sure you discuss any questions you have with your health care provider.   Document Released: 10/24/2007 Document Revised: 05/28/2014 Document Reviewed: 01/14/2013 Elsevier Interactive Patient Education 2016 [employer].  Cholelithiasis Cholelithiasis (also called gallstones) is a form of gallbladder disease. The gallbladder is a small organ that helps you digest fats. Symptoms of gallstones are:  Feeling sick to your stomach (nausea).  Throwing up (vomiting).  Belly pain.  Yellowing of the skin (jaundice).  Sudden pain. You may feel the pain for  minutes to hours.  Fever.  Pain to the touch. HOME CARE  Only take medicines as told by your doctor.  Eat a low-fat diet until you see your doctor again. Eating fat can result in pain.  Follow up with your doctor as told. Attacks usually happen time after time. Surgery is usually needed for permanent treatment. GET HELP RIGHT AWAY IF:   Your pain gets worse.  Your pain is not helped by medicines.  You have a fever and lasting symptoms for more than 2-3  days.  You have a fever and your symptoms suddenly get worse.  You keep feeling sick to your stomach and throwing up. MAKE SURE YOU:   Understand these instructions.  Will watch your condition.  Will get help right away if you are not doing well or get worse.   This information is not intended to replace advice given to you by your health care provider. Make sure you discuss any questions you have with your health care provider.   Document Released: 10/24/2007 Document Revised: 01/07/2013 Document Reviewed: 10/29/2012 Elsevier Interactive Patient Education [employer] Inc.

## 2015-03-13 NOTE — Progress Notes (Signed)
Pt. Discharged to home via wc. Discharge instructions and medication regimen reviewed at bedside with patient and granddaughter. Granddaughter, POA verbalizes understanding of instructions and medication regimen. Patient assessment unchanged from this morning. TELE and IV discontinued per policy.

## 2015-03-13 NOTE — Discharge Summary (Signed)
[name] - [name]   PATIENT NAME: [name]    MR#:  [id number]  DATE OF BIRTH:  13-Apr-1941  DATE OF ADMISSION:  03/12/2015 ADMITTING PHYSICIAN: Enid Baas, MD  DATE OF DISCHARGE: 03/13/2015 12:32 PM  PRIMARY CARE PHYSICIAN: Corky Downs, MD    ADMISSION DIAGNOSIS:  Shortness of breath [R06.02] Chest pain, unspecified chest pain type [R07.9]  DISCHARGE DIAGNOSIS:  Active Problems:   Chest pain   SECONDARY DIAGNOSIS:   Past Medical History  Diagnosis Date  . A-fib (HCC)   . Dementia   . Hypertension   . High cholesterol   . GERD (gastroesophageal reflux disease)   . Acid reflux     HOSPITAL COURSE:   [name] is a 74 y.o. male with a known history of atrial fibrillation on Coumadin, mild dementia, hypertension significant acid reflux and history of hepatic valve replacement surgery presents from home secondary to an episode of dizziness and chest tightness.  #1 chest pain-given patient's risk factors for underlying coronary disease he was observed overnight on telemetry. -He had 3 sets of cardiac markers checked which were negative. He is currently chest pain-free and hemodynamically stable and had no acute EKG changes and therefore was discharged home. -Patient will continue follow-up with cardiology as an outpatient. Patient will continue his Plavix/Coumadin.  #2 Acid reflux-significant acid reflux, but intermittent left upper quadrant pain with heartburn sensation. -Takes both Nexium and Dexilant both as outpatient with minimal relief. -Patient had no worsening heartburn, intolerance to oral intake while in the hospital. He likely can be seen by gastroenterology as an outpatient and would benefit from outpatient endoscopy.  #3 Right upper quadrant pain-with elevated alkaline phosphatase, chronic right upper quadrant pain. -Patient did have a ultrasound done which showed gallstones but no evidence of acute  cholecystitis. He was clinically asymptomatic upon discharge.  #4 atrial fibrillation-patient remained rate controlled in the hospital. He will continue his amiodarone. -He is on Coumadin and his INR is therapeutic.  #5 hypertension-patient will continue his lisinopril  She was discharged home with close follow-up with his primary care physician also gastroenterology as an outpatient.  DISCHARGE CONDITIONS:   Stable  CONSULTS OBTAINED:  Treatment Team:  Elnita Maxwell, MD  DRUG ALLERGIES:  No Known Allergies  DISCHARGE MEDICATIONS:   Discharge Medication List as of 03/13/2015  9:51 AM    CONTINUE these medications which have NOT CHANGED   Details  amiodarone (PACERONE) 200 MG tablet Take 200 mg by mouth daily., Until Discontinued, Historical Med    calcium carbonate (TUMS - DOSED IN MG ELEMENTAL CALCIUM) 500 MG chewable tablet Chew 1 tablet by mouth daily., Until Discontinued, Historical Med    clopidogrel (PLAVIX) 75 MG tablet Take 75 mg by mouth daily., Until Discontinued, Historical Med    dexlansoprazole (DEXILANT) 60 MG capsule Take 60 mg by mouth daily., Until Discontinued, Historical Med    furosemide (LASIX) 20 MG tablet Take 20 mg by mouth daily., Until Discontinued, Historical Med    gabapentin (NEURONTIN) 100 MG capsule Take 300 mg by mouth at bedtime. , Until Discontinued, Historical Med    isosorbide dinitrate (ISORDIL) 20 MG tablet Take 20 mg by mouth daily., Until Discontinued, Historical Med    lisinopril (PRINIVIL,ZESTRIL) 10 MG tablet Take 10 mg by mouth daily., Until Discontinued, Historical Med    rosuvastatin (CRESTOR) 10 MG tablet Take 10 mg by mouth daily., Until Discontinued, Historical Med    !! warfarin (COUMADIN) 1 MG  tablet Take 1 mg by mouth every other day. *do not take on Sundays* *note dose*, Until Discontinued, Historical Med    !! warfarin (COUMADIN) 2 MG tablet Take 2 mg by mouth every other day. *do not take on Sundays* *note  dose*, Until Discontinued, Historical Med    !! warfarin (COUMADIN) 2.5 MG tablet Take 2.5 mg by mouth daily at 6 PM., Until Discontinued, Historical Med     !! - Potential duplicate medications found. Please discuss with provider.    STOP taking these medications     esomeprazole (NEXIUM) 20 MG capsule          DISCHARGE INSTRUCTIONS:   DIET:  Cardiac diet  DISCHARGE CONDITION:  Stable  ACTIVITY:  Activity as tolerated  OXYGEN:  Home Oxygen: No.   Oxygen Delivery: room air  DISCHARGE LOCATION:  home   If you experience worsening of your admission symptoms, develop shortness of breath, life threatening emergency, suicidal or homicidal thoughts you must seek medical attention immediately by calling 911 or calling your MD immediately  if symptoms less severe.  You Must read complete instructions/literature along with all the possible adverse reactions/side effects for all the Medicines you take and that have been prescribed to you. Take any new Medicines after you have completely understood and accpet all the possible adverse reactions/side effects.   Please note  You were cared for by a hospitalist during your hospital stay. If you have any questions about your discharge medications or the care you received while you were in the hospital after you are discharged, you can call the unit and asked to speak with the hospitalist on call if the hospitalist that took care of you is not available. Once you are discharged, your primary care physician will handle any further medical issues. Please note that NO REFILLS for any discharge medications will be authorized once you are discharged, as it is imperative that you return to your primary care physician (or establish a relationship with a primary care physician if you do not have one) for your aftercare needs so that they can reassess your need for medications and monitor your lab values.     Today   Clinically feels well. No  chest pain, nausea, vomiting, heartburn.  VITAL SIGNS:  Blood pressure 123/81, pulse 79, temperature 98.8 F (37.1 C), temperature source Oral, resp. rate 18, height  (1.676 m), weight 77.429 kg (170 lb 11.2 oz), SpO2 96 %.  I/O:   Intake/Output Summary (Last 24 hours) at 03/13/15 1252 Last data filed at 03/13/15 0830  Gross per 24 hour  Intake    720 ml  Output    550 ml  Net    170 ml    PHYSICAL EXAMINATION:  GENERAL:  73 y.o.-year-old patient lying in the bed in no acute distress.  EYES: Pupils equal, round, reactive to light and accommodation. No scleral icterus. Extraocular muscles intact.  HEENT: Head atraumatic, normocephalic. Oropharynx and nasopharynx clear.  NECK:  Supple, no jugular venous distention. No thyroid enlargement, no tenderness.  LUNGS: Normal breath sounds bilaterally, no wheezing, rales,rhonchi. No use of accessory muscles of respiration.  CARDIOVASCULAR: S1, S2 normal. No murmurs, rubs, or gallops.  ABDOMEN: Soft, non-tender, non-distended. Bowel sounds present. No organomegaly or mass.  EXTREMITIES: No pedal edema, cyanosis, or clubbing.  NEUROLOGIC: Cranial nerves II through XII are intact. No focal motor or sensory defecits b/l.  PSYCHIATRIC: The patient is alert and oriented x 3. Good affect.  SKIN: No  obvious rash, lesion, or ulcer.   DATA REVIEW:   CBC  Recent Labs Lab 03/12/15 1024  WBC 11.5*  HGB 11.0*  HCT 34.6*  PLT 162    Chemistries   Recent Labs Lab 03/12/15 0940  NA 141  K 3.6  CL 107  CO2 27  GLUCOSE 140*  BUN 11  CREATININE 1.42*  CALCIUM 8.5*  AST 89*  ALT 59  ALKPHOS 242*  BILITOT 1.3*    Cardiac Enzymes  Recent Labs Lab 03/12/15 2357  TROPONINI <0.03    Microbiology Results  Results for orders placed or performed in visit on 02/21/14  Culture, blood (single)     Status: None   Collection Time: 02/21/14  2:13 AM  Result Value Ref Range Status   Micro Text Report   Final       COMMENT                    NO GROWTH AEROBICALLY/ANAEROBICALLY IN 5 DAYS   ANTIBIOTIC                                                        RADIOLOGY:  Dg Chest 2 View  03/12/2015  CLINICAL DATA:  Dizziness this morning with chest tightness, hypertension, dementia, atrial fibrillation, post CABG and AVR EXAM: CHEST  2 VIEW COMPARISON:  07/06/2014 FINDINGS: Borderline enlargement of cardiac silhouette post CABG and AVR. Mediastinal contours and pulmonary vascularity normal. Chronic linear subsegmental atelectasis at both lung bases versus scarring. No definite acute infiltrate, pleural effusion or pneumothorax. No acute osseous findings. IMPRESSION: Post CABG and AVR. Chronic bibasilar atelectasis versus scarring. Electronically Signed   By: Ulyses SouthwardMark  Boles M.D.   On: 03/12/2015 10:29   [place] Abdomen Limited Ruq  03/12/2015  CLINICAL DATA:  Right upper quadrant pain x1 day EXAM: US ABDOMEN LIMITED - RIGHT UPPER QUADRANT COMPARISON:  None. FINDINGS: Gallbladder: Layering gallstones measuring up to 6 mm. Mild gallbladder wall thickening, although this is favored to be secondary to underdistention. No pericholecystic fluid. Negative sonographic Murphy's sign. Common bile duct: Diameter: 3 mm Liver: No focal lesion identified. Within normal limits in parenchymal echogenicity. IMPRESSION: Cholelithiasis, without associated sonographic findings to suggest acute cholecystitis. Electronically Signed   By: Charline BillsSriyesh  Krishnan M.D.   On: 03/12/2015 15:33      Management plans discussed with the patient, family and they are in agreement.  CODE STATUS:     Code Status Orders        Start     Ordered   03/12/15 1259  Full code   Continuous     03/12/15 1258    Advance Directive Documentation        Most Recent Value   Type of Advance Directive  Healthcare Power of Attorney   Pre-existing out of facility DNR order (yellow form or pink MOST form)     "MOST" Form in Place?        TOTAL TIME TAKING CARE OF THIS PATIENT:  40 minutes.    [place] SirenSAINANI,VIVEK J M.D on 03/13/2015 at 12:52 PM  Between 7am to 6pm - Pager - [phone]  After 6pm go to [email or web address] - password [email or web address] [facility]  [facility]  Office  [phone]  CC: Primary care physician; Corky DownsMASOUD, JAVED, MD

## 2015-03-23 ENCOUNTER — Emergency Department: Payer: Medicare Other

## 2015-03-23 ENCOUNTER — Emergency Department
Admission: EM | Admit: 2015-03-23 | Discharge: 2015-03-23 | Disposition: A | Discharge status: Home / Self Care | Payer: Medicare Other | Attending: Emergency Medicine | Admitting: Emergency Medicine

## 2015-03-23 DIAGNOSIS — Z79899 Other long term (current) drug therapy: Secondary | ICD-10-CM | POA: Insufficient documentation

## 2015-03-23 DIAGNOSIS — R079 Chest pain, unspecified: Secondary | ICD-10-CM | POA: Diagnosis present

## 2015-03-23 DIAGNOSIS — Z7902 Long term (current) use of antithrombotics/antiplatelets: Secondary | ICD-10-CM | POA: Diagnosis not present

## 2015-03-23 DIAGNOSIS — Z7901 Long term (current) use of anticoagulants: Secondary | ICD-10-CM | POA: Insufficient documentation

## 2015-03-23 DIAGNOSIS — K2970 Gastritis, unspecified, without bleeding: Secondary | ICD-10-CM

## 2015-03-23 DIAGNOSIS — I10 Essential (primary) hypertension: Secondary | ICD-10-CM | POA: Insufficient documentation

## 2015-03-23 LAB — CBC
HCT: 35.8 % — ABNORMAL LOW (ref 40.0–52.0)
Hemoglobin: 11.2 g/dL — ABNORMAL LOW (ref 13.0–18.0)
MCH: 23 pg — ABNORMAL LOW (ref 26.0–34.0)
MCHC: 31.3 g/dL — ABNORMAL LOW (ref 32.0–36.0)
MCV: 73.5 fL — AB (ref 80.0–100.0)
PLATELETS: 190 10*3/uL (ref 150–440)
RBC: 4.88 MIL/uL (ref 4.40–5.90)
RDW: 18.8 % — ABNORMAL HIGH (ref 11.5–14.5)
WBC: 13.8 10*3/uL — AB (ref 3.8–10.6)

## 2015-03-23 LAB — BASIC METABOLIC PANEL
ANION GAP: 10 (ref 5–15)
BUN: 13 mg/dL (ref 6–20)
CO2: 26 mmol/L (ref 22–32)
Calcium: 8.3 mg/dL — ABNORMAL LOW (ref 8.9–10.3)
Chloride: 103 mmol/L (ref 101–111)
Creatinine, Ser: 1.45 mg/dL — ABNORMAL HIGH (ref 0.61–1.24)
GFR calc Af Amer: 53 mL/min — ABNORMAL LOW (ref >60)
GFR, EST NON AFRICAN AMERICAN: 46 mL/min — AB (ref >60)
Glucose, Bld: 178 mg/dL — ABNORMAL HIGH (ref 65–99)
POTASSIUM: 3.5 mmol/L (ref 3.5–5.1)
SODIUM: 139 mmol/L (ref 135–145)

## 2015-03-23 LAB — TROPONIN I
TROPONIN I: 0.03 ng/mL (ref <0.031)
Troponin I: <0.03 ng/mL (ref <0.031)

## 2015-03-23 MED ORDER — GI COCKTAIL ~~LOC~~
30.0000 mL | ORAL | Status: AC
  Administered 2015-03-23: 30 mL via ORAL

## 2015-03-23 MED ORDER — FAMOTIDINE 20 MG PO TABS
ORAL_TABLET | ORAL | Status: AC
  Administered 2015-03-23: 40 mg via ORAL
  Filled 2015-03-23: qty 2

## 2015-03-23 MED ORDER — SUCRALFATE 1 G PO TABS: 1.0000 g | ORAL_TABLET | Freq: Four times a day (QID) | ORAL | Status: DC

## 2015-03-23 MED ORDER — RANITIDINE HCL 150 MG PO CAPS: 150.0000 mg | ORAL_CAPSULE | Freq: Two times a day (BID) | ORAL | Status: DC

## 2015-03-23 MED ORDER — FAMOTIDINE 20 MG PO TABS
40.0000 mg | ORAL_TABLET | Freq: Once | ORAL | Status: AC
  Administered 2015-03-23: 40 mg via ORAL

## 2015-03-23 MED ORDER — GI COCKTAIL ~~LOC~~
ORAL | Status: AC
  Administered 2015-03-23: 30 mL via ORAL
  Filled 2015-03-23: qty 30

## 2015-03-23 MED ORDER — ASPIRIN 81 MG PO CHEW
324.0000 mg | CHEWABLE_TABLET | Freq: Once | ORAL | Status: AC
  Administered 2015-03-23: 324 mg via ORAL

## 2015-03-23 MED ORDER — ASPIRIN 81 MG PO CHEW
CHEWABLE_TABLET | ORAL | Status: AC
  Administered 2015-03-23: 324 mg via ORAL
  Filled 2015-03-23: qty 4

## 2015-03-23 NOTE — ED Notes (Signed)
Patient with intermittent left sided chest pain 7/10 pain that started at 10 am.

## 2015-03-23 NOTE — ED Provider Notes (Signed)
[facility] Emergency Department Provider Note  ____________________________________________  Time seen: 4:50 PM  I have reviewed the triage vital signs and the nursing notes.   HISTORY  Chief Complaint Chest Pain    HPI [name] is a 74 y.o. male with a history of A. fib CABG GERD and dementia is brought to the ED for chronic shortness of breath and an episode of chest pain that started today. The chest pain is in the left side of the chest described as dull, nonradiating. No cough or sore throat. No diaphoresis or vomiting. Not exertional or pleuritic. He reports he has had pain like this before but is not sure why. It started about an hour after eating breakfast this morning and has been there all morning but waxing and waning.  He was seen in the ED about 2 weeks ago but has not yet followed up with cardiology since then. As for the similar symptoms.   Past Medical History  Diagnosis Date  . A-fib (HCC)   . Dementia   . Hypertension   . High cholesterol   . GERD (gastroesophageal reflux disease)   . Acid reflux      Patient Active Problem List   Diagnosis Date Noted  . Chest pain 03/12/2015     Past Surgical History  Procedure Laterality Date  . Aortic valve replacement    . Cardiac bypass     . Coronary artery bypass graft       Current Outpatient Rx  Name  Route  Sig  Dispense  Refill  . amiodarone (PACERONE) 200 MG tablet   Oral   Take 200 mg by mouth daily.         . calcium carbonate (TUMS - DOSED IN MG ELEMENTAL CALCIUM) 500 MG chewable tablet   Oral   Chew 1 tablet by mouth daily.         . clopidogrel (PLAVIX) 75 MG tablet   Oral   Take 75 mg by mouth daily.         Marland Kitchen dexlansoprazole (DEXILANT) 60 MG capsule   Oral   Take 60 mg by mouth daily.         . furosemide (LASIX) 20 MG tablet   Oral   Take 20 mg by mouth daily.         Marland Kitchen gabapentin (NEURONTIN) 100 MG capsule   Oral   Take 300 mg by mouth at  bedtime.          . isosorbide dinitrate (ISORDIL) 20 MG tablet   Oral   Take 20 mg by mouth daily.         Marland Kitchen lisinopril (PRINIVIL,ZESTRIL) 10 MG tablet   Oral   Take 10 mg by mouth daily.         . ranitidine (ZANTAC) 150 MG capsule   Oral   Take 1 capsule (150 mg total) by mouth 2 (two) times daily.   28 capsule   0   . rosuvastatin (CRESTOR) 10 MG tablet   Oral   Take 10 mg by mouth daily.         . sucralfate (CARAFATE) 1 G tablet   Oral   Take 1 tablet (1 g total) by mouth 4 (four) times daily.   120 tablet   1   . warfarin (COUMADIN) 1 MG tablet   Oral   Take 1 mg by mouth every other day. *do not take on Sundays* *note dose*         .  warfarin (COUMADIN) 2 MG tablet   Oral   Take 2 mg by mouth every other day. *do not take on Sundays* *note dose*         . warfarin (COUMADIN) 2.5 MG tablet   Oral   Take 2.5 mg by mouth daily at 6 PM.            Allergies Review of patient's allergies indicates no known allergies.   Family History  Problem Relation Age of Onset  . Cancer Mother   . Hypertension Father   . CAD Father     Social History Social History  Substance Use Topics  . Smoking status: Never Smoker   . Smokeless tobacco: Current User    Types: Snuff, Chew  . Alcohol Use: No    Review of Systems  Constitutional:   No fever or chills. No weight changes Eyes:   No blurry vision or double vision.  ENT:   No sore throat. Cardiovascular:   Positive as above chest pain. Respiratory:   No dyspnea or cough. Gastrointestinal:   Negative for abdominal pain, vomiting and diarrhea.  No BRBPR or melena. Genitourinary:   Negative for dysuria, urinary retention, bloody urine, or difficulty urinating. Musculoskeletal:   Negative for back pain. No joint swelling or pain. Skin:   Negative for rash. Neurological:   Negative for headaches, focal weakness or numbness. Psychiatric:  No anxiety or depression.   Endocrine:  No hot/cold  intolerance, changes in energy, or sleep difficulty.  10-point ROS otherwise negative.  ____________________________________________   PHYSICAL EXAM:  VITAL SIGNS: ED Triage Vitals  Enc Vitals Group     BP 03/23/15 1409 119/67 mmHg     Pulse Rate 03/23/15 1409 86     Resp 03/23/15 1409 16     Temp 03/23/15 1409 98.5 F (36.9 C)     Temp Source 03/23/15 1409 Oral     SpO2 03/23/15 1409 98 %     Weight 03/23/15 1409 170 lb (77.111 kg)     Height 03/23/15 1409  (1.753 m)     Head Cir --      Peak Flow --      Pain Score 03/23/15 1411 7     Pain Loc --      Pain Edu? --      Excl. in GC? --      Constitutional:   Alert and oriented. Well appearing and in no distress. Eyes:   No scleral icterus. No conjunctival pallor. PERRL. EOMI ENT   Head:   Normocephalic and atraumatic.   Nose:   No congestion/rhinnorhea. No septal hematoma   Mouth/Throat:   MMM, no pharyngeal erythema. No peritonsillar mass. No uvula shift.   Neck:   No stridor. No SubQ emphysema. No meningismus. Hematological/Lymphatic/Immunilogical:   No cervical lymphadenopathy. Cardiovascular:   RRR. Normal and symmetric distal pulses are present in all extremities. No murmurs, rubs, or gallops. Respiratory:   Normal respiratory effort without tachypnea nor retractions. Breath sounds are clear and equal bilaterally. No wheezes/rales/rhonchi. Gastrointestinal:   Soft with mild left upper quadrant tenderness that reproduces the pain. No distention. There is no CVA tenderness.  No rebound, rigidity, or guarding. Genitourinary:   deferred Musculoskeletal:   Nontender with normal range of motion in all extremities. No joint effusions.  No lower extremity tenderness.  No edema. Neurologic:   Normal speech and language.  CN 2-10 normal. Motor grossly intact. No pronator drift.  Normal gait. No gross focal neurologic  deficits are appreciated.  Skin:    Skin is warm, dry and intact. No rash noted.  No  petechiae, purpura, or bullae. Psychiatric:   Mood and affect are normal. Speech and behavior are normal. Patient exhibits appropriate insight and judgment.  ____________________________________________    LABS (pertinent positives/negatives) (all labs ordered are listed, but only abnormal results are displayed) Labs Reviewed  CBC - Abnormal; Notable for the following:    WBC 13.8 (*)    Hemoglobin 11.2 (*)    HCT 35.8 (*)    MCV 73.5 (*)    MCH 23.0 (*)    MCHC 31.3 (*)    RDW 18.8 (*)    All other components within normal limits  BASIC METABOLIC PANEL - Abnormal; Notable for the following:    Glucose, Bld 178 (*)    Creatinine, Ser 1.45 (*)    Calcium 8.3 (*)    GFR calc non Af Amer 46 (*)    GFR calc Af Amer 53 (*)    All other components within normal limits  TROPONIN I  TROPONIN I   ____________________________________________   EKG  Interpreted by me Normal sinus rhythm rate of 86, left axis, first-degree AV block, 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  Patient presents with vague chest pain which is not able to clarify specifically very well but it does not sound consistent with ACS PE TAD pneumothorax carditis mediastinitis pneumonia or sepsis. By history and exam and does appear to be related to gastritis and GERD. We'll do a trial of acid suppression therapy on top of the PPI that he is already taking and checking a second troponin.  ----------------------------------------- 6:09 PM on  03/23/2015 -----------------------------------------  Repeat troponin also negative. Symptoms improved after GI cocktail and Pepcid. We'll discharge home to follow up with primary care and cardiology. Care discussed with family member who has healthcare POA.    ____________________________________________   FINAL CLINICAL IMPRESSION(S) / ED DIAGNOSES  Final diagnoses:  Nonspecific chest pain  Gastritis      Sharman CheekPhillip Stancil Deisher, MD 03/23/15 [phone]

## 2015-03-23 NOTE — Discharge Instructions (Signed)
Gastritis, Adult °Gastritis is soreness and swelling (inflammation) of the lining of the stomach. Gastritis can develop as a sudden onset (acute) or long-term (chronic) condition. If gastritis is not treated, it can lead to stomach bleeding and ulcers. °CAUSES  °Gastritis occurs when the stomach lining is weak or damaged. Digestive juices from the stomach then inflame the weakened stomach lining. The stomach lining may be weak or damaged due to viral or bacterial infections. One common bacterial infection is the Helicobacter pylori infection. Gastritis can also result from excessive alcohol consumption, taking certain medicines, or having too much acid in the stomach.  °SYMPTOMS  °In some cases, there are no symptoms. When symptoms are present, they may include: °· Pain or a burning sensation in the upper abdomen. °· Nausea. °· Vomiting. °· An uncomfortable feeling of fullness after eating. °DIAGNOSIS  °Your caregiver may suspect you have gastritis based on your symptoms and a physical exam. To determine the cause of your gastritis, your caregiver may perform the following: °· Blood or stool tests to check for the H pylori bacterium. °· Gastroscopy. A thin, flexible tube (endoscope) is passed down the esophagus and into the stomach. The endoscope has a light and camera on the end. Your caregiver uses the endoscope to view the inside of the stomach. °· Taking a tissue sample (biopsy) from the stomach to examine under a microscope. °TREATMENT  °Depending on the cause of your gastritis, medicines may be prescribed. If you have a bacterial infection, such as an H pylori infection, antibiotics may be given. If your gastritis is caused by too much acid in the stomach, H2 blockers or antacids may be given. Your caregiver may recommend that you stop taking aspirin, ibuprofen, or other nonsteroidal anti-inflammatory drugs (NSAIDs). °HOME CARE INSTRUCTIONS °· Only take over-the-counter or prescription medicines as directed by  your caregiver. °· If you were given antibiotic medicines, take them as directed. Finish them even if you start to feel better. °· Drink enough fluids to keep your urine clear or pale yellow. °· Avoid foods and drinks that make your symptoms worse, such as: °· Caffeine or alcoholic drinks. °· Chocolate. °· Peppermint or mint flavorings. °· Garlic and onions. °· Spicy foods. °· Citrus fruits, such as oranges, lemons, or limes. °· Tomato-based foods such as sauce, chili, salsa, and pizza. °· Fried and fatty foods. °· Eat small, frequent meals instead of large meals. °SEEK IMMEDIATE MEDICAL CARE IF:  °· You have black or dark red stools. °· You vomit blood or material that looks like coffee grounds. °· You are unable to keep fluids down. °· Your abdominal pain gets worse. °· You have a fever. °· You do not feel better after 1 week. °· You have any other questions or concerns. °MAKE SURE YOU: °· Understand these instructions. °· Will watch your condition. °· Will get help right away if you are not doing well or get worse. °  °This information is not intended to replace advice given to you by your health care provider. Make sure you discuss any questions you have with your health care provider. °  °Document Released: 05/01/2001 Document Revised: 11/06/2011 Document Reviewed: 06/20/2011 °Elsevier Interactive Patient Education ©2016 [employer] ° °Nonspecific Chest Pain  °Chest pain can be caused by many different conditions. There is always a chance that your pain could be related to something serious, such as a heart attack or a blood clot in your lungs. Chest pain can also be caused by conditions that are   not life-threatening. If you have chest pain, it is very important to follow up with your health care provider. °CAUSES  °Chest pain can be caused by: °· Heartburn. °· Pneumonia or bronchitis. °· Anxiety or stress. °· Inflammation around your heart (pericarditis) or lung (pleuritis or pleurisy). °· A blood clot in  your lung. °· A collapsed lung (pneumothorax). It can develop suddenly on its own (spontaneous pneumothorax) or from trauma to the chest. °· Shingles infection (varicella-zoster virus). °· Heart attack. °· Damage to the bones, muscles, and cartilage that make up your chest wall. This can include: °¨ Bruised bones due to injury. °¨ Strained muscles or cartilage due to frequent or repeated coughing or overwork. °¨ Fracture to one or more ribs. °¨ Sore cartilage due to inflammation (costochondritis). °RISK FACTORS  °Risk factors for chest pain may include: °· Activities that increase your risk for trauma or injury to your chest. °· Respiratory infections or conditions that cause frequent coughing. °· Medical conditions or overeating that can cause heartburn. °· Heart disease or family history of heart disease. °· Conditions or health behaviors that increase your risk of developing a blood clot. °· Having had chicken pox (varicella zoster). °SIGNS AND SYMPTOMS °Chest pain can feel like: °· Burning or tingling on the surface of your chest or deep in your chest. °· Crushing, pressure, aching, or squeezing pain. °· Dull or sharp pain that is worse when you move, cough, or take a deep breath. °· Pain that is also felt in your back, neck, shoulder, or arm, or pain that spreads to any of these areas. °Your chest pain may come and go, or it may stay constant. °DIAGNOSIS °Lab tests or other studies may be needed to find the cause of your pain. Your health care provider may have you take a test called an ambulatory ECG (electrocardiogram). An ECG records your heartbeat patterns at the time the test is performed. You may also have other tests, such as: °· Transthoracic echocardiogram (TTE). During echocardiography, sound waves are used to create a picture of all of the heart structures and to look at how blood flows through your heart. °· Transesophageal echocardiogram (TEE). This is a more advanced imaging test that obtains  images from inside your body. It allows your health care provider to see your heart in finer detail. °· Cardiac monitoring. This allows your health care provider to monitor your heart rate and rhythm in real time. °· Holter monitor. This is a portable device that records your heartbeat and can help to diagnose abnormal heartbeats. It allows your health care provider to track your heart activity for several days, if needed. °· Stress tests. These can be done through exercise or by taking medicine that makes your heart beat more quickly. °· Blood tests. °· Imaging tests. °TREATMENT  °Your treatment depends on what is causing your chest pain. Treatment may include: °· Medicines. These may include: °¨ Acid blockers for heartburn. °¨ Anti-inflammatory medicine. °¨ Pain medicine for inflammatory conditions. °¨ Antibiotic medicine, if an infection is present. °¨ Medicines to dissolve blood clots. °¨ Medicines to treat coronary artery disease. °· Supportive care for conditions that do not require medicines. This may include: °¨ Resting. °¨ Applying heat or cold packs to injured areas. °¨ Limiting activities until pain decreases. °HOME CARE INSTRUCTIONS °· If you were prescribed an antibiotic medicine, finish it all even if you start to feel better. °· Avoid any activities that bring on chest pain. °· Do not use any tobacco   products, including cigarettes, chewing tobacco, or electronic cigarettes. If you need help quitting, ask your health care provider. °· Do not drink alcohol. °· Take medicines only as directed by your health care provider. °· Keep all follow-up visits as directed by your health care provider. This is important. This includes any further testing if your chest pain does not go away. °· If heartburn is the cause for your chest pain, you may be told to keep your head raised (elevated) while sleeping. This reduces the chance that acid will go from your stomach into your esophagus. °· Make lifestyle changes as  directed by your health care provider. These may include: °¨ Getting regular exercise. Ask your health care provider to suggest some activities that are safe for you. °¨ Eating a heart-healthy diet. A registered dietitian can help you to learn healthy eating options. °¨ Maintaining a healthy weight. °¨ Managing diabetes, if necessary. °¨ Reducing stress. °SEEK MEDICAL CARE IF: °· Your chest pain does not go away after treatment. °· You have a rash with blisters on your chest. °· You have a fever. °SEEK IMMEDIATE MEDICAL CARE IF:  °· Your chest pain is worse. °· You have an increasing cough, or you cough up blood. °· You have severe abdominal pain. °· You have severe weakness. °· You faint. °· You have chills. °· You have sudden, unexplained chest discomfort. °· You have sudden, unexplained discomfort in your arms, back, neck, or jaw. °· You have shortness of breath at any time. °· You suddenly start to sweat, or your skin gets clammy. °· You feel nauseous or you vomit. °· You suddenly feel light-headed or dizzy. °· Your heart begins to beat quickly, or it feels like it is skipping beats. °These symptoms may represent a serious problem that is an emergency. Do not wait to see if the symptoms will go away. Get medical help right away. Call your local emergency services (911 in the [place] Do not drive yourself to the hospital. °  °This information is not intended to replace advice given to you by your health care provider. Make sure you discuss any questions you have with your health care provider. °  °Document Released: 02/14/2005 Document Revised: 05/28/2014 Document Reviewed: 12/11/2013 °Elsevier Interactive Patient Education ©2016 [employer] ° °

## 2015-03-31 ENCOUNTER — Encounter: Payer: Self-pay | Admitting: Emergency Medicine

## 2015-03-31 ENCOUNTER — Emergency Department
Admission: EM | Admit: 2015-03-31 | Discharge: 2015-03-31 | Disposition: A | Discharge status: Home / Self Care | Payer: Medicare Other | Attending: Emergency Medicine | Admitting: Emergency Medicine

## 2015-03-31 ENCOUNTER — Emergency Department: Payer: Medicare Other

## 2015-03-31 DIAGNOSIS — R079 Chest pain, unspecified: Secondary | ICD-10-CM | POA: Diagnosis not present

## 2015-03-31 DIAGNOSIS — Z7901 Long term (current) use of anticoagulants: Secondary | ICD-10-CM | POA: Diagnosis not present

## 2015-03-31 DIAGNOSIS — Z79899 Other long term (current) drug therapy: Secondary | ICD-10-CM | POA: Insufficient documentation

## 2015-03-31 DIAGNOSIS — I10 Essential (primary) hypertension: Secondary | ICD-10-CM | POA: Insufficient documentation

## 2015-03-31 DIAGNOSIS — R55 Syncope and collapse: Secondary | ICD-10-CM

## 2015-03-31 DIAGNOSIS — Z7902 Long term (current) use of antithrombotics/antiplatelets: Secondary | ICD-10-CM | POA: Diagnosis not present

## 2015-03-31 DIAGNOSIS — R42 Dizziness and giddiness: Secondary | ICD-10-CM | POA: Diagnosis present

## 2015-03-31 LAB — BASIC METABOLIC PANEL
Anion gap: 4 — ABNORMAL LOW (ref 5–15)
BUN: 12 mg/dL (ref 6–20)
CO2: 25 mmol/L (ref 22–32)
Calcium: 8 mg/dL — ABNORMAL LOW (ref 8.9–10.3)
Chloride: 109 mmol/L (ref 101–111)
Creatinine, Ser: 1.54 mg/dL — ABNORMAL HIGH (ref 0.61–1.24)
GFR calc Af Amer: 50 mL/min — ABNORMAL LOW (ref >60)
GFR, EST NON AFRICAN AMERICAN: 43 mL/min — AB (ref >60)
GLUCOSE: 178 mg/dL — AB (ref 65–99)
POTASSIUM: 3.7 mmol/L (ref 3.5–5.1)
Sodium: 138 mmol/L (ref 135–145)

## 2015-03-31 LAB — CBC
HEMATOCRIT: 34.5 % — AB (ref 40.0–52.0)
Hemoglobin: 10.6 g/dL — ABNORMAL LOW (ref 13.0–18.0)
MCH: 23 pg — AB (ref 26.0–34.0)
MCHC: 30.9 g/dL — AB (ref 32.0–36.0)
MCV: 74.3 fL — ABNORMAL LOW (ref 80.0–100.0)
Platelets: 173 10*3/uL (ref 150–440)
RBC: 4.64 MIL/uL (ref 4.40–5.90)
RDW: 18.3 % — AB (ref 11.5–14.5)
WBC: 15.1 10*3/uL — ABNORMAL HIGH (ref 3.8–10.6)

## 2015-03-31 LAB — TROPONIN I: Troponin I: <0.03 ng/mL (ref <0.031)

## 2015-03-31 LAB — PROTIME-INR
INR: 4.28
PROTHROMBIN TIME: 40 s — AB (ref 11.4–15.0)

## 2015-03-31 MED ORDER — SODIUM CHLORIDE 0.9 % IV BOLUS (SEPSIS)
500.0000 mL | Freq: Once | INTRAVENOUS | Status: AC
  Administered 2015-03-31: 500 mL via INTRAVENOUS

## 2015-03-31 NOTE — ED Provider Notes (Signed)
Time Seen: Approximately 1020 I have reviewed the triage notes  Chief Complaint: Dizziness and Chest Pain   History of Present Illness: [name] is a 74 y.o. male *who has some history of mild dementia. Patient's been seen and evaluated here on occasion for acute chest discomfort. He apparently notified his family that he was having some chest discomfort and dizziness. Patient denies any chest pain at present and describes his dizziness as feeling lightheaded. He denies any nausea, vomiting, arm, jaw pain. Patient's workup in the past and been negative and he has a history of previous atrial fibrillation and is on chronic anticoagulation therapy. He has history of an aortic valve replacement. Patient's had recent echocardiogram and a cardiology evaluation. Past Medical History  Diagnosis Date  . A-fib (HCC)   . Dementia   . Hypertension   . High cholesterol   . GERD (gastroesophageal reflux disease)   . Acid reflux     Patient Active Problem List   Diagnosis Date Noted  . Chest pain 03/12/2015    Past Surgical History  Procedure Laterality Date  . Aortic valve replacement    . Cardiac bypass     . Coronary artery bypass graft      Past Surgical History  Procedure Laterality Date  . Aortic valve replacement    . Cardiac bypass     . Coronary artery bypass graft      Current Outpatient Rx  Name  Route  Sig  Dispense  Refill  . amiodarone (PACERONE) 200 MG tablet   Oral   Take 200 mg by mouth daily.         . calcium carbonate (TUMS - DOSED IN MG ELEMENTAL CALCIUM) 500 MG chewable tablet   Oral   Chew 1 tablet by mouth daily.         . clopidogrel (PLAVIX) 75 MG tablet   Oral   Take 75 mg by mouth daily.         Marland Kitchen dexlansoprazole (DEXILANT) 60 MG capsule   Oral   Take 60 mg by mouth daily.         Marland Kitchen esomeprazole (NEXIUM) 20 MG capsule   Oral   Take 20 mg by mouth daily at 12 noon.         . furosemide (LASIX) 20 MG tablet   Oral   Take 20 mg by  mouth daily.         Marland Kitchen gabapentin (NEURONTIN) 100 MG capsule   Oral   Take 300 mg by mouth at bedtime.          . isosorbide dinitrate (ISORDIL) 20 MG tablet   Oral   Take 20 mg by mouth daily.         Marland Kitchen lisinopril (PRINIVIL,ZESTRIL) 10 MG tablet   Oral   Take 10 mg by mouth daily.         . ranitidine (ZANTAC) 150 MG capsule   Oral   Take 1 capsule (150 mg total) by mouth 2 (two) times daily.   28 capsule   0   . rosuvastatin (CRESTOR) 10 MG tablet   Oral   Take 10 mg by mouth daily.         . sucralfate (CARAFATE) 1 G tablet   Oral   Take 1 tablet (1 g total) by mouth 4 (four) times daily.   120 tablet   1   . warfarin (COUMADIN) 1 MG tablet   Oral   Take  1 mg by mouth See admin instructions. Every other day at night except on Sunday         . warfarin (COUMADIN) 2 MG tablet   Oral   Take 2 mg by mouth See admin instructions. Every other day at night except on Sundays           Allergies:  Review of patient's allergies indicates no known allergies.  Family History: Family History  Problem Relation Age of Onset  . Cancer Mother   . Hypertension Father   . CAD Father     Social History: Social History  Substance Use Topics  . Smoking status: Never Smoker   . Smokeless tobacco: Current User    Types: Snuff, Chew  . Alcohol Use: No     Review of Systems:   10 point review of systems was performed and was otherwise negative:  Constitutional: No fever Eyes: No visual disturbances ENT: No sore throat, ear pain Cardiac: No chest pain Respiratory: No shortness of breath, wheezing, or stridor Abdomen: No abdominal pain, no vomiting, No diarrhea Endocrine: No weight loss, No night sweats Extremities: No peripheral edema, cyanosis Skin: No rashes, easy bruising Neurologic: No focal weakness, trouble with speech or swollowing Urologic: No dysuria, Hematuria, or urinary frequency Review of systems also taken through family who arrived  later.  Physical Exam:  ED Triage Vitals  Enc Vitals Group     BP 03/31/15 1010 139/76 mmHg     Pulse Rate 03/31/15 1010 86     Resp 03/31/15 1010 18     Temp 03/31/15 1010 98.1 F (36.7 C)     Temp Source 03/31/15 1010 Oral     SpO2 03/31/15 1010 98 %     Weight 03/31/15 1010 170 lb (77.111 kg)     Height 03/31/15 1010 5\' 9"  (1.753 m)     Head Cir --      Peak Flow --      Pain Score 03/31/15 1038 0     Pain Loc --      Pain Edu? --      Excl. in GC? --     General: Awake , Alert , and Oriented times 3; GCS 15 Head: Normal cephalic , atraumatic Eyes: Pupils equal , round, reactive to light Nose/Throat: No nasal drainage, patent upper airway without erythema or exudate.  Neck: Supple, Full range of motion, No anterior adenopathy or palpable thyroid masses Lungs: Clear to ascultation without wheezes , rhonchi, or rales Heart: Regular rate, regular rhythm without murmurs , gallops , or rubs Abdomen: Soft, non tender without rebound, guarding , or rigidity; bowel sounds positive and symmetric in all 4 quadrants. No organomegaly .        Extremities: 2 plus symmetric pulses. No edema, clubbing or cyanosis Neurologic: normal ambulation, Motor symmetric without deficits, sensory intact Skin: warm, dry, no rashes   Labs:   All laboratory work was reviewed including any pertinent negatives or positives listed below:  Labs Reviewed  BASIC METABOLIC PANEL - Abnormal; Notable for the following:    Glucose, Bld 178 (*)    Creatinine, Ser 1.54 (*)    Calcium 8.0 (*)    GFR calc non Af Amer 43 (*)    GFR calc Af Amer 50 (*)    Anion gap 4 (*)    All other components within normal limits  CBC - Abnormal; Notable for the following:    WBC 15.1 (*)    Hemoglobin 10.6 (*)  HCT 34.5 (*)    MCV 74.3 (*)    MCH 23.0 (*)    MCHC 30.9 (*)    RDW 18.3 (*)    All other components within normal limits  PROTIME-INR - Abnormal; Notable for the following:    Prothrombin Time 40.0 (*)     INR 4.28 (*)    All other components within normal limits  TROPONIN I   review of his laboratory work shows no elevated INR with the patient being on Coumadin. Otherwise, his laboratory testing overall appeared to be within normal limits for the patient.  EKG: ED ECG REPORT I, Jennye Moccasin, the attending physician, personally viewed and interpreted this ECG.  Date: 03/31/2015 EKG Time: 1013 Rate: 84 Rhythm: normal sinus rhythm QRS Axis: Left bundle branch by Intervals: normal ST/T Wave abnormalities: normal Conduction Disutrbances: none Narrative Interpretation: unremarkable No significant change or ischemic changes are noted in comparison to previous EKGs   Radiology:  Narrative:    CLINICAL DATA: Dizziness. Left-sided chest pain.  EXAM: CHEST - 2 VIEW  COMPARISON: Two-view chest x-ray 03/23/2015.  FINDINGS: Median sternotomy is again noted. The heart size is normal. The lungs are clear. Degenerative changes of the thoracolumbar spine are stable.  IMPRESSION: 1. No acute cardiopulmonary disease or significant interval change. 2. Stable scarring in the lower lobes bilaterally.          I personally reviewed the radiologic studies    ED Course:  Patient's stay here was uneventful and does not appear to have suffered any signs of an acute coronary syndrome at this time. Outside of his INR be an elevated the patient does not appear to have any significant abnormalities on his laboratory work. The patient's case was reviewed with his family and there's been a slight increase in his creatinine level and I felt he may be having some dehydration which explains lightheadedness. Not have a syncopal episode and I'm not suspicious of an acute arrhythmia at this time. Patient's creatinine is increased slightly to 1.54 but I felt did not require further inpatient management. Family expressed concerns about taking the patient home and advised them that they would did  not seem to be any obvious reason on admission to the hospital at this time but they should follow up with her primary physician and cardiologist and review his medications and laboratory work etc. She was given an IV fluid bolus here in emergency department and remained with a stable blood pressure.    Assessment: Near syncope Daily unspecified chest pain    Plan: * Outpatient management Patient was advised to return immediately if condition worsens. Patient was advised to follow up with her primary care physician or other specialized physicians involved and in their current assessment. [facility] nurse also have home help discuss the current home situation with the family to see if we can offer any assistance. The daughter states that her father complains of chest discomfort almost on a daily basis and she is not sure when to bring the patient to the emergency department etc.           Jennye Moccasin, MD 03/31/15 [id number]

## 2015-03-31 NOTE — ED Notes (Signed)
Pt here with c/o cp and dizziness that began this am, states he had just woken up. Has a hx of CABG. States he is mildly sob. Pt appears in no distress, VSS.

## 2015-03-31 NOTE — ED Notes (Signed)
Dr. Huel CoteQuigley notified of lab results

## 2015-03-31 NOTE — ED Notes (Signed)
Spoke with [employer] work regarding needing assistance at home per Dr. Renaee MundaQuigley's request. Unable to see pt in ER today but will do follow up phone call with pt tomorrow.

## 2015-04-20 ENCOUNTER — Ambulatory Visit: Payer: Medicare Other | Admitting: Gastroenterology

## 2015-04-22 ENCOUNTER — Telehealth: Payer: Self-pay

## 2015-04-22 NOTE — Telephone Encounter (Signed)
Patient's granddaughter called stating that her grandfather had an appointment on 04/20/2015 with Dr. Servando SnareWohl. Patient was rescheduled for Tuesday 04/26/2015 at 3:30 PM. Shanda BumpsJessica (granddaughter) agreed.

## 2015-04-26 ENCOUNTER — Ambulatory Visit
Admission: RE | Admit: 2015-04-26 | Discharge: 2015-04-26 | Disposition: A | Discharge status: Home / Self Care | Payer: Medicare Other | Source: Ambulatory Visit | Attending: Cardiology | Admitting: Cardiology

## 2015-04-26 ENCOUNTER — Encounter (INDEPENDENT_AMBULATORY_CARE_PROVIDER_SITE_OTHER): Payer: Self-pay

## 2015-04-26 ENCOUNTER — Encounter: Payer: Self-pay | Admitting: Gastroenterology

## 2015-04-26 ENCOUNTER — Other Ambulatory Visit: Payer: Self-pay | Admitting: Internal Medicine

## 2015-04-26 ENCOUNTER — Other Ambulatory Visit: Payer: Self-pay

## 2015-04-26 ENCOUNTER — Ambulatory Visit (INDEPENDENT_AMBULATORY_CARE_PROVIDER_SITE_OTHER): Payer: Medicare Other | Admitting: Gastroenterology

## 2015-04-26 ENCOUNTER — Ambulatory Visit
Admission: RE | Admit: 2015-04-26 | Discharge: 2015-04-26 | Disposition: A | Discharge status: Home / Self Care | Payer: Medicare Other | Source: Ambulatory Visit | Attending: Internal Medicine | Admitting: Internal Medicine

## 2015-04-26 VITALS — BP 119/72 | HR 94 | Temp 98.1°F | Ht 66.0 in | Wt 176.6 lb

## 2015-04-26 DIAGNOSIS — R0602 Shortness of breath: Secondary | ICD-10-CM | POA: Insufficient documentation

## 2015-04-26 DIAGNOSIS — Z952 Presence of prosthetic heart valve: Secondary | ICD-10-CM | POA: Insufficient documentation

## 2015-04-26 DIAGNOSIS — Z951 Presence of aortocoronary bypass graft: Secondary | ICD-10-CM | POA: Insufficient documentation

## 2015-04-26 DIAGNOSIS — K219 Gastro-esophageal reflux disease without esophagitis: Secondary | ICD-10-CM | POA: Diagnosis not present

## 2015-04-26 NOTE — Progress Notes (Signed)
Primary Care Physician: Corky DownsMASOUD, JAVED, MD  Primary Gastroenterologist:  Dr. Midge Miniumarren Sondi Desch  Chief Complaint  Patient presents with  . Gastroesophageal Reflux    HPI: Matthew LeydenJoe Blanchard is a 74 y.o. male here for follow-up of heartburn. The patient has had heartburn and states he has been tried on multiple medications including Zantac, cervical fake, Dexilant and reports that none of these have worked in the past. The patient states that he was set up for an upper endoscopy but did not show up for the exam because he was nervous. The patient denies any black stools or bloody stools. He also denies any nausea or vomiting or food getting stuck in his esophagus. 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 The patient is on Coumadin.  Current Outpatient Prescriptions  Medication Sig Dispense Refill  . amiodarone (PACERONE) 200 MG tablet Take 200 mg by mouth daily.    . calcium carbonate (TUMS - DOSED IN MG ELEMENTAL CALCIUM) 500 MG chewable tablet Chew 1 tablet by mouth daily.    . clopidogrel (PLAVIX) 75 MG tablet Take 75 mg by mouth daily.    Marland Kitchen. dexlansoprazole (DEXILANT) 60 MG capsule Take 60 mg by mouth daily.    . furosemide (LASIX) 20 MG tablet Take 20 mg by mouth daily.    Marland Kitchen. gabapentin (NEURONTIN) 100 MG capsule Take 300 mg by mouth at bedtime.     . isosorbide dinitrate (ISORDIL) 20 MG tablet Take 20 mg by mouth daily.    . rosuvastatin (CRESTOR) 10 MG tablet Take 10 mg by mouth daily.    Marland Kitchen. warfarin (COUMADIN) 1 MG tablet Take 1 mg by mouth See admin instructions. Every other day at night except on Sunday    . esomeprazole (NEXIUM) 20 MG capsule Take 20 mg by mouth daily at 12 noon.    Marland Kitchen. lisinopril (PRINIVIL,ZESTRIL) 10 MG tablet Take 10 mg by mouth daily.    . ranitidine (ZANTAC) 150 MG capsule Take 1 capsule (150 mg total) by mouth 2 (two) times daily. (Patient not taking: Reported on 04/26/2015) 28 capsule 0  . sucralfate (CARAFATE) 1 G tablet Take 1 tablet  (1 g total) by mouth 4 (four) times daily. (Patient not taking: Reported on 04/26/2015) 120 tablet 1  . warfarin (COUMADIN) 2 MG tablet Take 2 mg by mouth See admin instructions. Every other day at night except on Sundays     No current facility-administered medications for this visit.    Allergies as of 04/26/2015  . (No Known Allergies)    ROS:  General: Negative for anorexia, weight loss, fever, chills, fatigue, weakness. ENT: Negative for hoarseness, difficulty swallowing , nasal congestion. CV: Negative for chest pain, angina, palpitations, dyspnea on exertion, peripheral edema.  Respiratory: Negative for dyspnea at rest, dyspnea on exertion, cough, sputum, wheezing.  GI: See history of present illness. GU:  Negative for dysuria, hematuria, urinary incontinence, urinary frequency, nocturnal urination.  Endo: Negative for unusual weight change.    Physical Examination:   BP 119/72 mmHg  Pulse 94  Temp(Src) 98.1 F (36.7 C)  Ht 5\' 6"  (1.676 m)  Wt 176 lb 9.6 oz (80.105 kg)  BMI 28.52 kg/m2  General: Well-nourished, well-developed in no acute distress.  Eyes: No icterus. Conjunctivae pink. Mouth: Oropharyngeal mucosa moist and pink , no lesions erythema or exudate. Lungs: Clear to auscultation bilaterally. Non-labored. Heart: Regular rate and rhythm, aortic murmur heard with no rubs or gallops.  Abdomen: Bowel sounds  are normal, nontender, nondistended, no hepatosplenomegaly or masses, no abdominal bruits or hernia , no rebound or guarding.   Extremities: No lower extremity edema. No clubbing or deformities. Neuro: Alert and oriented x 3.  Grossly intact. Skin: Warm and dry, no jaundice.   Psych: Alert and cooperative, normal mood and affect.  Labs:    Imaging Studies: Dg Chest 2 View  03/31/2015  CLINICAL DATA:  Dizziness.  Left-sided chest pain. EXAM: CHEST - 2 VIEW COMPARISON:  Two-view chest x-ray 03/23/2015. FINDINGS: Median sternotomy is again noted. The heart  size is normal. The lungs are clear. Degenerative changes of the thoracolumbar spine are stable. IMPRESSION: 1. No acute cardiopulmonary disease or significant interval change. 2. Stable scarring in the lower lobes bilaterally. Electronically Signed   By: Marin Roberts M.D.   On: 03/31/2015 11:00    Assessment and Plan:   [name] is a 74 y.o. y/o male who comes in with continued heartburn. The patient has been tried on multiple medications without any relief. The patient has missed his last upper endoscopy appointment due to being nervous. The patient will be set up for repeat EGD. The patient has been told that if the upper endoscopy does not show any cause of his symptoms he may need to undergo a pH study. The patient and his daughter have been explained the plan and agree with it.I have discussed risks & benefits which include, but are not limited to, bleeding, infection, perforation & drug reaction.  The patient agrees with this plan & written consent will be obtained.      Note: This dictation was prepared with Dragon dictation along with smaller phrase technology. Any transcriptional errors that result from this process are unintentional.

## 2015-04-29 ENCOUNTER — Emergency Department
Admission: EM | Admit: 2015-04-29 | Discharge: 2015-04-29 | Disposition: A | Discharge status: Home / Self Care | Payer: Medicare Other | Attending: Emergency Medicine | Admitting: Emergency Medicine

## 2015-04-29 ENCOUNTER — Emergency Department: Payer: Medicare Other

## 2015-04-29 ENCOUNTER — Encounter: Payer: Self-pay | Admitting: Emergency Medicine

## 2015-04-29 ENCOUNTER — Other Ambulatory Visit: Payer: Self-pay

## 2015-04-29 DIAGNOSIS — Z7901 Long term (current) use of anticoagulants: Secondary | ICD-10-CM | POA: Diagnosis not present

## 2015-04-29 DIAGNOSIS — S0081XA Abrasion of other part of head, initial encounter: Secondary | ICD-10-CM | POA: Diagnosis not present

## 2015-04-29 DIAGNOSIS — Z951 Presence of aortocoronary bypass graft: Secondary | ICD-10-CM | POA: Diagnosis not present

## 2015-04-29 DIAGNOSIS — W01198A Fall on same level from slipping, tripping and stumbling with subsequent striking against other object, initial encounter: Secondary | ICD-10-CM | POA: Diagnosis not present

## 2015-04-29 DIAGNOSIS — Y9389 Activity, other specified: Secondary | ICD-10-CM | POA: Diagnosis not present

## 2015-04-29 DIAGNOSIS — E7800 Pure hypercholesterolemia, unspecified: Secondary | ICD-10-CM | POA: Diagnosis not present

## 2015-04-29 DIAGNOSIS — I447 Left bundle-branch block, unspecified: Secondary | ICD-10-CM | POA: Diagnosis not present

## 2015-04-29 DIAGNOSIS — I4891 Unspecified atrial fibrillation: Secondary | ICD-10-CM | POA: Diagnosis not present

## 2015-04-29 DIAGNOSIS — Y9289 Other specified places as the place of occurrence of the external cause: Secondary | ICD-10-CM | POA: Diagnosis not present

## 2015-04-29 DIAGNOSIS — I440 Atrioventricular block, first degree: Secondary | ICD-10-CM | POA: Diagnosis not present

## 2015-04-29 DIAGNOSIS — I10 Essential (primary) hypertension: Secondary | ICD-10-CM | POA: Diagnosis not present

## 2015-04-29 DIAGNOSIS — K219 Gastro-esophageal reflux disease without esophagitis: Secondary | ICD-10-CM | POA: Diagnosis not present

## 2015-04-29 DIAGNOSIS — K449 Diaphragmatic hernia without obstruction or gangrene: Secondary | ICD-10-CM | POA: Diagnosis not present

## 2015-04-29 DIAGNOSIS — T148XXA Other injury of unspecified body region, initial encounter: Secondary | ICD-10-CM

## 2015-04-29 DIAGNOSIS — K2950 Unspecified chronic gastritis without bleeding: Secondary | ICD-10-CM | POA: Diagnosis not present

## 2015-04-29 DIAGNOSIS — Z809 Family history of malignant neoplasm, unspecified: Secondary | ICD-10-CM | POA: Diagnosis not present

## 2015-04-29 DIAGNOSIS — W19XXXA Unspecified fall, initial encounter: Secondary | ICD-10-CM

## 2015-04-29 DIAGNOSIS — Z79899 Other long term (current) drug therapy: Secondary | ICD-10-CM | POA: Diagnosis not present

## 2015-04-29 DIAGNOSIS — Z8249 Family history of ischemic heart disease and other diseases of the circulatory system: Secondary | ICD-10-CM | POA: Diagnosis not present

## 2015-04-29 DIAGNOSIS — Z952 Presence of prosthetic heart valve: Secondary | ICD-10-CM | POA: Diagnosis not present

## 2015-04-29 DIAGNOSIS — F0390 Unspecified dementia without behavioral disturbance: Secondary | ICD-10-CM | POA: Diagnosis not present

## 2015-04-29 DIAGNOSIS — Z87891 Personal history of nicotine dependence: Secondary | ICD-10-CM | POA: Diagnosis not present

## 2015-04-29 DIAGNOSIS — Y998 Other external cause status: Secondary | ICD-10-CM | POA: Insufficient documentation

## 2015-04-29 DIAGNOSIS — S0993XA Unspecified injury of face, initial encounter: Secondary | ICD-10-CM | POA: Diagnosis present

## 2015-04-29 DIAGNOSIS — R12 Heartburn: Secondary | ICD-10-CM | POA: Diagnosis present

## 2015-04-29 LAB — CBC WITH DIFFERENTIAL/PLATELET
BASOS ABS: 0.1 10*3/uL (ref 0–0.1)
Basophils Relative: 1 %
EOS PCT: 1 %
Eosinophils Absolute: 0.1 10*3/uL (ref 0–0.7)
HCT: 36.3 % — ABNORMAL LOW (ref 40.0–52.0)
HEMOGLOBIN: 11.4 g/dL — AB (ref 13.0–18.0)
LYMPHS PCT: 4 %
Lymphs Abs: 0.7 10*3/uL — ABNORMAL LOW (ref 1.0–3.6)
MCH: 23.1 pg — ABNORMAL LOW (ref 26.0–34.0)
MCHC: 31.3 g/dL — ABNORMAL LOW (ref 32.0–36.0)
MCV: 73.7 fL — AB (ref 80.0–100.0)
Monocytes Absolute: 1 10*3/uL (ref 0.2–1.0)
Monocytes Relative: 5 %
NEUTROS PCT: 89 %
Neutro Abs: 18 10*3/uL — ABNORMAL HIGH (ref 1.4–6.5)
PLATELETS: 165 10*3/uL (ref 150–440)
RBC: 4.92 MIL/uL (ref 4.40–5.90)
RDW: 19.5 % — ABNORMAL HIGH (ref 11.5–14.5)
WBC: 20 10*3/uL — AB (ref 3.8–10.6)

## 2015-04-29 LAB — BASIC METABOLIC PANEL
ANION GAP: 9 (ref 5–15)
BUN: 14 mg/dL (ref 6–20)
CALCIUM: 7.9 mg/dL — AB (ref 8.9–10.3)
CO2: 22 mmol/L (ref 22–32)
CREATININE: 1.62 mg/dL — AB (ref 0.61–1.24)
Chloride: 104 mmol/L (ref 101–111)
GFR calc Af Amer: 47 mL/min — ABNORMAL LOW (ref >60)
GFR, EST NON AFRICAN AMERICAN: 40 mL/min — AB (ref >60)
GLUCOSE: 176 mg/dL — AB (ref 65–99)
POTASSIUM: 4.2 mmol/L (ref 3.5–5.1)
SODIUM: 135 mmol/L (ref 135–145)

## 2015-04-29 LAB — PROTIME-INR
INR: 2.02
Prothrombin Time: 22.7 seconds — ABNORMAL HIGH (ref 11.4–15.0)

## 2015-04-29 MED ORDER — BACITRACIN ZINC 500 UNIT/GM EX OINT
TOPICAL_OINTMENT | Freq: Two times a day (BID) | CUTANEOUS | Status: DC
  Administered 2015-04-29: 1 via TOPICAL

## 2015-04-29 MED ORDER — ACETAMINOPHEN 325 MG PO TABS
650.0000 mg | ORAL_TABLET | Freq: Once | ORAL | Status: AC
  Administered 2015-04-29: 650 mg via ORAL
  Filled 2015-04-29: qty 2

## 2015-04-29 NOTE — Discharge Instructions (Signed)
Please seek medical attention for any high fevers, chest pain, shortness of breath, change in behavior, persistent vomiting, bloody stool or any other new or concerning symptoms.  Fall Prevention in Hospitals, Adult As a hospital patient, your condition and the treatments you receive can increase your risk for falls. Some additional risk factors for falls in a hospital include:  Being in an unfamiliar environment.  Being on bed rest.  Your surgery.  Taking certain medicines.  Your tubing requirements, such as intravenous (IV) therapy or catheters. It is important that you learn how to decrease fall risks while at the hospital. Below are important tips that can help prevent falls. SAFETY TIPS FOR PREVENTING FALLS Talk about your risk of falling.  Ask your health care provider why you are at risk for falling. Is it your medicine, illness, tubing placement, or something else?  Make a plan with your health care provider to keep you safe from falls.  Ask your health care provider or pharmacist about side effects of your medicines. Some medicines can make you dizzy or affect your coordination. Ask for help.  Ask for help before getting out of bed. You may need to press your call button.  Ask for assistance in getting safely to the toilet.  Ask for a walker or cane to be put at your bedside. Ask that most of the side rails on your bed be placed up before your health care provider leaves the room.  Ask family or friends to sit with you.  Ask for things that are out of your reach, such as your glasses, hearing aids, telephone, bedside table, or call button. Follow these tips to avoid falling:  Stay lying or seated, rather than standing, while waiting for help.  Wear rubber-soled slippers or shoes whenever you walk in the hospital.  Avoid quick, sudden movements.  Change positions slowly.  Sit on the side of your bed before standing.  Stand up slowly and wait before you start to  walk.  Let your health care provider know if there is a spill on the floor.  Pay careful attention to the medical equipment, electrical cords, and tubes around you.  When you need help, use your call button by your bed or in the bathroom. Wait for one of your health care providers to help you.  If you feel dizzy or unsure of your footing, return to bed and wait for assistance.  Avoid being distracted by the TV, telephone, or another person in your room.  Do not lean or support yourself on rolling objects, such as IV poles or bedside tables.   This information is not intended to replace advice given to you by your health care provider. Make sure you discuss any questions you have with your health care provider.   Document Released: 05/04/2000 Document Revised: 05/28/2014 Document Reviewed: 01/13/2012 Elsevier Interactive Patient Education [employer]2016 Elsevier Inc.

## 2015-04-29 NOTE — ED Notes (Signed)
Pt has no complaints at present time 

## 2015-04-29 NOTE — ED Provider Notes (Signed)
[facility] Emergency Department Provider Note  ____________________________________________  Time seen: On EMS arrival  I have reviewed the triage vital signs and the nursing notes.   HISTORY  Chief Complaint Fall   History limited by: Not Limited   HPI [name] is a 74 y.o. male who presents to the emergency department today after a fall. The patient states he was leaning over to pet his dog when he fell forward. He did scrape the left side of his cheek on the carpet. He denies any loss of consciousness. He states he did try to catch his fall however is not complaining of any pain in his wrist elbows or shoulders. He denies any chest pain or shortness of breath during this episode. He denies any recent fevers.     Past Medical History  Diagnosis Date  . A-fib (HCC)   . Dementia   . Hypertension   . High cholesterol   . GERD (gastroesophageal reflux disease)   . Acid reflux     Patient Active Problem List   Diagnosis Date Noted  . Chest pain 03/12/2015    Past Surgical History  Procedure Laterality Date  . Aortic valve replacement    . Cardiac bypass     . Coronary artery bypass graft      Current Outpatient Rx  Name  Route  Sig  Dispense  Refill  . amiodarone (PACERONE) 200 MG tablet   Oral   Take 100 mg by mouth daily.          . calcium & magnesium carbonates (MYLANTA) [id number] MG tablet   Oral   Take 1 tablet by mouth as needed for heartburn.         . clopidogrel (PLAVIX) 75 MG tablet   Oral   Take 75 mg by mouth daily.         Marland Kitchen. dexlansoprazole (DEXILANT) 60 MG capsule   Oral   Take 60 mg by mouth daily.         . furosemide (LASIX) 20 MG tablet   Oral   Take 20 mg by mouth daily. 1 tablet every other morning         . gabapentin (NEURONTIN) 100 MG capsule   Oral   Take 300 mg by mouth at bedtime.          . isosorbide dinitrate (ISORDIL) 20 MG tablet   Oral   Take 20 mg by mouth daily.         .  rosuvastatin (CRESTOR) 10 MG tablet   Oral   Take 10 mg by mouth daily.         Marland Kitchen. warfarin (COUMADIN) 1 MG tablet   Oral   Take 0.5 mg by mouth See admin instructions. Every other day at night except on Sunday           Allergies Review of patient's allergies indicates no known allergies.  Family History  Problem Relation Age of Onset  . Cancer Mother   . Hypertension Father   . CAD Father     Social History Social History  Substance Use Topics  . Smoking status: Never Smoker   . Smokeless tobacco: Former NeurosurgeonUser    Types: Snuff, Chew  . Alcohol Use: No    Review of Systems  Constitutional: Negative for fever. Cardiovascular: Negative for chest pain. Respiratory: Negative for shortness of breath. Gastrointestinal: Negative for abdominal pain, vomiting and diarrhea. Genitourinary: Negative for dysuria. Musculoskeletal: Negative for back pain.  Skin: Negative for rash. Positive for abrasion to the left cheek Neurological: Negative for headaches, focal weakness or numbness.   10-point ROS otherwise negative.  ____________________________________________   PHYSICAL EXAM:  VITAL SIGNS:   98.3 F (36.8 C)  93  18  137/65 mmHg  98 %     Constitutional: Alert and oriented. Well appearing and in no distress. Eyes: Conjunctivae are normal. PERRL. Normal extraocular movements. ENT   Head: Normocephalic. Small abrasion to left cheek. No hemotympanum.    Nose: No congestion/rhinnorhea.   Mouth/Throat: Mucous membranes are moist.   Neck: No stridor. No midline tenderness.  Hematological/Lymphatic/Immunilogical: No cervical lymphadenopathy. Cardiovascular: Normal rate, regular rhythm.  No murmurs, rubs, or gallops.  Respiratory: Normal respiratory effort without tachypnea nor retractions. Breath sounds are clear and equal bilaterally. No wheezes/rales/rhonchi. No chest wall tenderness or crepitus.  Gastrointestinal: Soft and nontender. No distention.   Genitourinary: Deferred Musculoskeletal: Normal range of motion in all extremities. No joint effusions.  No lower extremity tenderness nor edema. No spinal tenderness. Pelvis stable.  Neurologic:  Normal speech and language. No gross focal neurologic deficits are appreciated.  Skin:  Skin is warm, dry and intact. No rash noted. Psychiatric: Mood and affect are normal. Speech and behavior are normal. Patient exhibits appropriate insight and judgment.  ____________________________________________    LABS (pertinent positives/negatives)  Labs Reviewed  CBC WITH DIFFERENTIAL/PLATELET - Abnormal; Notable for the following:    WBC 20.0 (*)    Hemoglobin 11.4 (*)    HCT 36.3 (*)    MCV 73.7 (*)    MCH 23.1 (*)    MCHC 31.3 (*)    RDW 19.5 (*)    Neutro Abs 18.0 (*)    Lymphs Abs 0.7 (*)    All other components within normal limits  BASIC METABOLIC PANEL - Abnormal; Notable for the following:    Glucose, Bld 176 (*)    Creatinine, Ser 1.62 (*)    Calcium 7.9 (*)    GFR calc non Af Amer 40 (*)    GFR calc Af Amer 47 (*)    All other components within normal limits  PROTIME-INR - Abnormal; Notable for the following:    Prothrombin Time 22.7 (*)    All other components within normal limits     ____________________________________________   EKG  I, Phineas Semen, attending physician, personally viewed and interpreted this EKG  EKG Time: 1835 Rate: 87 Rhythm: NSR Axis: left axis deviation Intervals: qtc 537 QRS: LBBB ST changes: no st elevation equivalent Impression: LBBB, abnormal ekg  LBBB present on ekg dated 04/01/15  ____________________________________________    RADIOLOGY  CT head IMPRESSION: No acute intracranial abnormality. Mild cerebral atrophy.  ____________________________________________   PROCEDURES  Procedure(s) performed: None  Critical Care performed: No  ____________________________________________   INITIAL IMPRESSION / ASSESSMENT  AND PLAN / ED COURSE  Pertinent labs & imaging results that were available during my care of the patient were reviewed by me and considered in my medical decision making (see chart for details).  Patient presented to the emergency department today after a fall. It appears that patient lost balance while leaning 4. This has happened to him in the past. Blood work was checked and no signs of anemia or concerning electrolyte abnormality. His white blood cell count was elevated however it appears to been elevated in the past. At this point no other signs of infection. I did discuss this finding with the family and advised to follow up with primary care  doctor. Radiographic study negative here. We will discharge home to follow up with primary care.  ____________________________________________   FINAL CLINICAL IMPRESSION(S) / ED DIAGNOSES  Final diagnoses:  Fall, initial encounter  Abrasion     Phineas Semen, MD 04/29/15 1954

## 2015-04-29 NOTE — ED Notes (Signed)
Pt presents to ER via EMS from home with a fall.Per EMS, family states pt tripped and fell. Currently on plavix  And warfarin. Left side of face with abrasion. Pt alert and oriented at present time.

## 2015-05-03 ENCOUNTER — Ambulatory Visit: Payer: Medicare Other | Admitting: Anesthesiology

## 2015-05-03 ENCOUNTER — Telehealth: Payer: Self-pay

## 2015-05-03 ENCOUNTER — Ambulatory Visit
Admission: RE | Admit: 2015-05-03 | Discharge: 2015-05-03 | Disposition: A | Discharge status: Home / Self Care | Payer: Medicare Other | Source: Ambulatory Visit | Attending: Gastroenterology | Admitting: Gastroenterology

## 2015-05-03 ENCOUNTER — Encounter: Payer: Self-pay | Admitting: Anesthesiology

## 2015-05-03 ENCOUNTER — Encounter
Admission: RE | Disposition: A | Discharge status: Home / Self Care | Payer: Self-pay | Source: Ambulatory Visit | Attending: Gastroenterology

## 2015-05-03 DIAGNOSIS — K2950 Unspecified chronic gastritis without bleeding: Secondary | ICD-10-CM | POA: Diagnosis not present

## 2015-05-03 DIAGNOSIS — Z87891 Personal history of nicotine dependence: Secondary | ICD-10-CM | POA: Insufficient documentation

## 2015-05-03 DIAGNOSIS — I10 Essential (primary) hypertension: Secondary | ICD-10-CM | POA: Insufficient documentation

## 2015-05-03 DIAGNOSIS — Z8249 Family history of ischemic heart disease and other diseases of the circulatory system: Secondary | ICD-10-CM | POA: Insufficient documentation

## 2015-05-03 DIAGNOSIS — Z951 Presence of aortocoronary bypass graft: Secondary | ICD-10-CM | POA: Insufficient documentation

## 2015-05-03 DIAGNOSIS — F0390 Unspecified dementia without behavioral disturbance: Secondary | ICD-10-CM | POA: Insufficient documentation

## 2015-05-03 DIAGNOSIS — K2990 Gastroduodenitis, unspecified, without bleeding: Secondary | ICD-10-CM

## 2015-05-03 DIAGNOSIS — K2970 Gastritis, unspecified, without bleeding: Secondary | ICD-10-CM | POA: Insufficient documentation

## 2015-05-03 DIAGNOSIS — K219 Gastro-esophageal reflux disease without esophagitis: Secondary | ICD-10-CM | POA: Diagnosis not present

## 2015-05-03 DIAGNOSIS — K449 Diaphragmatic hernia without obstruction or gangrene: Secondary | ICD-10-CM | POA: Insufficient documentation

## 2015-05-03 DIAGNOSIS — I447 Left bundle-branch block, unspecified: Secondary | ICD-10-CM | POA: Insufficient documentation

## 2015-05-03 DIAGNOSIS — Z79899 Other long term (current) drug therapy: Secondary | ICD-10-CM | POA: Insufficient documentation

## 2015-05-03 DIAGNOSIS — Z7901 Long term (current) use of anticoagulants: Secondary | ICD-10-CM | POA: Insufficient documentation

## 2015-05-03 DIAGNOSIS — Z809 Family history of malignant neoplasm, unspecified: Secondary | ICD-10-CM | POA: Insufficient documentation

## 2015-05-03 DIAGNOSIS — I4891 Unspecified atrial fibrillation: Secondary | ICD-10-CM | POA: Insufficient documentation

## 2015-05-03 DIAGNOSIS — Z952 Presence of prosthetic heart valve: Secondary | ICD-10-CM | POA: Insufficient documentation

## 2015-05-03 DIAGNOSIS — I440 Atrioventricular block, first degree: Secondary | ICD-10-CM | POA: Insufficient documentation

## 2015-05-03 DIAGNOSIS — E7800 Pure hypercholesterolemia, unspecified: Secondary | ICD-10-CM | POA: Insufficient documentation

## 2015-05-03 HISTORY — PX: ESOPHAGOGASTRODUODENOSCOPY (EGD) WITH PROPOFOL: SHX5813

## 2015-05-03 SURGERY — ESOPHAGOGASTRODUODENOSCOPY (EGD) WITH PROPOFOL
Anesthesia: General

## 2015-05-03 MED ORDER — PROPOFOL 10 MG/ML IV BOLUS
INTRAVENOUS | Status: DC | PRN
  Administered 2015-05-03: 50 mg via INTRAVENOUS

## 2015-05-03 MED ORDER — SODIUM CHLORIDE 0.9 % IV SOLN
INTRAVENOUS | Status: DC
  Administered 2015-05-03: 08:00:00 via INTRAVENOUS

## 2015-05-03 MED ORDER — PHENYLEPHRINE HCL 10 MG/ML IJ SOLN
INTRAMUSCULAR | Status: DC | PRN
  Administered 2015-05-03: 200 ug via INTRAVENOUS

## 2015-05-03 MED ORDER — LIDOCAINE HCL (CARDIAC) 20 MG/ML IV SOLN
INTRAVENOUS | Status: DC | PRN
  Administered 2015-05-03: 60 mg via INTRAVENOUS

## 2015-05-03 MED ORDER — GLYCOPYRROLATE 0.2 MG/ML IJ SOLN
INTRAMUSCULAR | Status: DC | PRN
  Administered 2015-05-03: 0.2 mg via INTRAVENOUS

## 2015-05-03 MED ORDER — PROPOFOL 500 MG/50ML IV EMUL
INTRAVENOUS | Status: DC | PRN
  Administered 2015-05-03: 100 ug/kg/min via INTRAVENOUS

## 2015-05-03 NOTE — Transfer of Care (Signed)
Immediate Anesthesia Transfer of Care Note  Patient: Matthew LeydenJoe Nierenberg  Procedure(s) Performed: Procedure(s): ESOPHAGOGASTRODUODENOSCOPY (EGD) WITH PROPOFOL (N/A)  Patient Location: PACU  Anesthesia Type:MAC  Level of Consciousness: sedated  Airway & Oxygen Therapy: Patient Spontanous Breathing  Post-op Assessment: Report given to RN  Post vital signs: Reviewed  Last Vitals:  Filed Vitals:   05/03/15 0730  BP: 139/75  Pulse: 88  Temp: 36.7 C  Resp: 17    Complications: No apparent anesthesia complications

## 2015-05-03 NOTE — Op Note (Signed)
[facility] Gastroenterology Patient Name: Matthew LeydenJoe Blanchard Procedure Date: 05/03/2015 7:46 AM MRN: [id number] Account #: [id number] Date of Birth: 07/13/1940 Admit Type: Outpatient Age: [age] Room[facility] ENDO ROOM 4 Gender: Male Note Status: Finalized Procedure:         Upper GI endoscopy Indications:       Heartburn Providers:         Midge Miniumarren Olliver Boyadjian, MD Referring MD:      Corky DownsJaved Masoud, MD (Referring MD) Medicines:         Propofol per Anesthesia Complications:     No immediate complications. Procedure:         Pre-Anesthesia Assessment:                    - Prior to the procedure, a History and Physical was                     performed, and patient medications and allergies were                     reviewed. The patient's tolerance of previous anesthesia                     was also reviewed. The risks and benefits of the procedure                     and the sedation options and risks were discussed with the                     patient. All questions were answered, and informed consent                     was obtained. Prior Anticoagulants: The patient has taken                     no previous anticoagulant or antiplatelet agents. ASA                     Grade Assessment: II - A patient with mild systemic                     disease. After reviewing the risks and benefits, the                     patient was deemed in satisfactory condition to undergo                     the procedure.                    After obtaining informed consent, the endoscope was passed                     under direct vision. Throughout the procedure, the                     patient's blood pressure, pulse, and oxygen saturations                     were monitored continuously. The Endoscope was introduced                     through the mouth, and advanced to the second part of  duodenum. The upper GI endoscopy was accomplished without                     difficulty. The  patient tolerated the procedure well. Findings:      A small hiatus hernia was present.      Diffuse moderate inflammation characterized by erythema and granularity       was found in the entire examined stomach. Biopsies were taken with a       cold forceps for histology.      The examined duodenum was normal. Impression:        - Small hiatus hernia.                    - Gastritis. Biopsied.                    - Normal examined duodenum. Recommendation:    - Await pathology results. Procedure Code(s): --- Professional ---                    [phone], Esophagogastroduodenoscopy, flexible, transoral;                     with biopsy, single or multiple Diagnosis Code(s): --- Professional ---                    R12, Heartburn                    K44.9, Diaphragmatic hernia without obstruction or gangrene                    K29.70, Gastritis, unspecified, without bleeding CPT copyright 2014 American Medical Association. All rights reserved. The codes documented in this report are preliminary and upon coder review may  be revised to meet current compliance requirements. Midge Minium, MD 05/03/2015 7:56:45 AM This report has been signed electronically. Number of Addenda: 0 Note Initiated On: 05/03/2015 7:46 AM      [facility]

## 2015-05-03 NOTE — Anesthesia Preprocedure Evaluation (Signed)
Anesthesia Evaluation  Patient identified by MRN, date of birth, ID band Patient awake    Reviewed: Allergy & Precautions, NPO status , Patient's Chart, lab work & pertinent test results, reviewed documented beta blocker date and time   Airway Mallampati: II  TM Distance: >3 FB     Dental  (+) Chipped   Pulmonary           Cardiovascular hypertension, Pt. on medications + CABG       Neuro/Psych    GI/Hepatic   Endo/Other    Renal/GU      Musculoskeletal   Abdominal   Peds  Hematology   Anesthesia Other Findings   Reproductive/Obstetrics                             Anesthesia Physical Anesthesia Plan  ASA: III  Anesthesia Plan: General   Post-op Pain Management:    Induction:   Airway Management Planned:   Additional Equipment:   Intra-op Plan:   Post-operative Plan:   Informed Consent: I have reviewed the patients History and Physical, chart, labs and discussed the procedure including the risks, benefits and alternatives for the proposed anesthesia with the patient or authorized representative who has indicated his/her understanding and acceptance.     Plan Discussed with: CRNA  Anesthesia Plan Comments:         Anesthesia Quick Evaluation

## 2015-05-03 NOTE — Anesthesia Postprocedure Evaluation (Signed)
Anesthesia Post Note  Patient: [name]  Procedure(s) Performed: Procedure(s) (LRB): ESOPHAGOGASTRODUODENOSCOPY (EGD) WITH PROPOFOL (N/A)  Patient location during evaluation: Endoscopy Anesthesia Type: General Level of consciousness: awake Pain management: pain level controlled Vital Signs Assessment: post-procedure vital signs reviewed and stable Cardiovascular status: blood pressure returned to baseline Anesthetic complications: no    Last Vitals:  Filed Vitals:   05/03/15 0820 05/03/15 0830  BP: 138/69 123/69  Pulse: 86 87  Temp:    Resp: 18 15    Last Pain: There were no vitals filed for this visit.               Kirby Argueta S

## 2015-05-03 NOTE — Telephone Encounter (Signed)
Per Dr. Juel BurrowMasoud today, pt is to restart all 3 blood thinners. Xarelto 20mg , Warfarin .05mg  and Plavix 75mg  tomorrow. Informed Shanda BumpsJessica (grand daughter) of this. Advised to contact Dr. Fredna DowMasoud's office with any questions or concerns.

## 2015-05-03 NOTE — H&P (Signed)
[facility]  [address] [place], [place] [address] Phone: [phone] Fax : [phone]  Primary Care Physician:  Corky DownsMASOUD, JAVED, MD Primary Gastroenterologist:  Dr. Servando SnareWohl  Pre-Procedure History & Physical: HPI:  Matthew LeydenJoe Plemons is a 74 y.o. male is here for an endoscopy.   Past Medical History  Diagnosis Date  . A-fib (HCC)   . Dementia   . Hypertension   . High cholesterol   . GERD (gastroesophageal reflux disease)   . Acid reflux     Past Surgical History  Procedure Laterality Date  . Aortic valve replacement    . Cardiac bypass     . Coronary artery bypass graft      Prior to Admission medications   Medication Sig Start Date End Date Taking? Authorizing Provider  amiodarone (PACERONE) 200 MG tablet Take 100 mg by mouth daily.    Yes Historical Provider, MD  calcium & magnesium carbonates (MYLANTA) 311-232 MG tablet Take 1 tablet by mouth as needed for heartburn.   Yes Historical Provider, MD  dexlansoprazole (DEXILANT) 60 MG capsule Take 60 mg by mouth daily.   Yes Historical Provider, MD  furosemide (LASIX) 20 MG tablet Take 20 mg by mouth daily. 1 tablet every other morning   Yes Historical Provider, MD  gabapentin (NEURONTIN) 100 MG capsule Take 300 mg by mouth at bedtime.    Yes Historical Provider, MD  isosorbide dinitrate (ISORDIL) 20 MG tablet Take 20 mg by mouth daily.   Yes Historical Provider, MD  rosuvastatin (CRESTOR) 10 MG tablet Take 10 mg by mouth daily.   Yes Historical Provider, MD  clopidogrel (PLAVIX) 75 MG tablet Take 75 mg by mouth daily.    Historical Provider, MD  warfarin (COUMADIN) 1 MG tablet Take 0.5 mg by mouth See admin instructions. Every other day at night except on Sunday    Historical Provider, MD    Allergies as of 04/26/2015  . (No Known Allergies)    Family History  Problem Relation Age of Onset  . Cancer Mother   . Hypertension Father   . CAD Father     Social History   Social History  . Marital  Status: Single    Spouse Name: N/A  . Number of Children: N/A  . Years of Education: N/A   Occupational History  . Not on file.   Social History Main Topics  . Smoking status: Never Smoker   . Smokeless tobacco: Former NeurosurgeonUser    Types: Snuff, Chew  . Alcohol Use: No  . Drug Use: No  . Sexual Activity: Not on file   Other Topics Concern  . Not on file   Social History Narrative    Review of Systems: See HPI, otherwise negative ROS  Physical Exam: BP 139/75 mmHg  Pulse 88  Temp(Src) 98.1 F (36.7 C) (Tympanic)  Resp 17  Ht 5\' 6"  (1.676 m)  Wt 176 lb (79.833 kg)  BMI 28.42 kg/m2  SpO2 99% General:   Alert,  pleasant and cooperative in NAD Head:  Normocephalic and atraumatic. Neck:  Supple; no masses or thyromegaly. Lungs:  Clear throughout to auscultation.    Heart:  Regular rate and rhythm. Abdomen:  Soft, nontender and nondistended. Normal bowel sounds, without guarding, and without rebound.   Neurologic:  Alert and  oriented x4;  grossly normal neurologically.  Impression/Plan: Matthew LeydenJoe Abt is here for an endoscopy to be performed for GERD  Risks, benefits, limitations, and alternatives regarding  endoscopy have been reviewed  with the patient.  Questions have been answered.  All parties agreeable.   Darlina Rumpf, MD  05/03/2015, 7:46 AM

## 2015-05-04 ENCOUNTER — Encounter: Payer: Self-pay | Admitting: Gastroenterology

## 2015-05-04 LAB — SURGICAL PATHOLOGY

## 2015-05-10 ENCOUNTER — Other Ambulatory Visit: Payer: Self-pay | Admitting: Internal Medicine

## 2015-05-10 ENCOUNTER — Telehealth: Payer: Self-pay

## 2015-05-10 ENCOUNTER — Other Ambulatory Visit: Payer: Self-pay

## 2015-05-10 DIAGNOSIS — K2970 Gastritis, unspecified, without bleeding: Secondary | ICD-10-CM

## 2015-05-10 DIAGNOSIS — R140 Abdominal distension (gaseous): Secondary | ICD-10-CM

## 2015-05-10 DIAGNOSIS — K2990 Gastroduodenitis, unspecified, without bleeding: Principal | ICD-10-CM

## 2015-05-10 NOTE — Telephone Encounter (Signed)
-----   Message from Midge Miniumarren Wohl, MD sent at 05/10/2015  6:56 AM EST ----- Have patient have blood test for h. Pylori and office visit to review path.

## 2015-05-10 NOTE — Telephone Encounter (Signed)
Pt's grand daughter has been notified of blood test for H pylori and office visit appt. Pt has been scheduled for 05-23-14.

## 2015-05-11 ENCOUNTER — Emergency Department: Payer: Medicare Other

## 2015-05-11 ENCOUNTER — Inpatient Hospital Stay
Admission: EM | Admit: 2015-05-11 | Discharge: 2015-05-17 | DRG: 948 | Disposition: A | Discharge status: Skilled Nursing Facility | Payer: Medicare Other | Attending: Internal Medicine | Admitting: Internal Medicine

## 2015-05-11 ENCOUNTER — Inpatient Hospital Stay: Payer: Medicare Other

## 2015-05-11 ENCOUNTER — Inpatient Hospital Stay
Admit: 2015-05-11 | Discharge: 2015-05-11 | Disposition: A | Discharge status: Home / Self Care | Payer: Medicare Other | Attending: Internal Medicine | Admitting: Internal Medicine

## 2015-05-11 DIAGNOSIS — K766 Portal hypertension: Secondary | ICD-10-CM | POA: Diagnosis present

## 2015-05-11 DIAGNOSIS — Z79899 Other long term (current) drug therapy: Secondary | ICD-10-CM | POA: Diagnosis not present

## 2015-05-11 DIAGNOSIS — K7460 Unspecified cirrhosis of liver: Secondary | ICD-10-CM

## 2015-05-11 DIAGNOSIS — I2510 Atherosclerotic heart disease of native coronary artery without angina pectoris: Secondary | ICD-10-CM | POA: Diagnosis present

## 2015-05-11 DIAGNOSIS — E7800 Pure hypercholesterolemia, unspecified: Secondary | ICD-10-CM | POA: Diagnosis present

## 2015-05-11 DIAGNOSIS — F0390 Unspecified dementia without behavioral disturbance: Secondary | ICD-10-CM | POA: Diagnosis present

## 2015-05-11 DIAGNOSIS — N179 Acute kidney failure, unspecified: Secondary | ICD-10-CM | POA: Diagnosis present

## 2015-05-11 DIAGNOSIS — N183 Chronic kidney disease, stage 3 (moderate): Secondary | ICD-10-CM | POA: Diagnosis present

## 2015-05-11 DIAGNOSIS — E871 Hypo-osmolality and hyponatremia: Secondary | ICD-10-CM | POA: Diagnosis present

## 2015-05-11 DIAGNOSIS — Z8249 Family history of ischemic heart disease and other diseases of the circulatory system: Secondary | ICD-10-CM

## 2015-05-11 DIAGNOSIS — K760 Fatty (change of) liver, not elsewhere classified: Secondary | ICD-10-CM | POA: Diagnosis present

## 2015-05-11 DIAGNOSIS — K5900 Constipation, unspecified: Secondary | ICD-10-CM | POA: Diagnosis present

## 2015-05-11 DIAGNOSIS — I130 Hypertensive heart and chronic kidney disease with heart failure and stage 1 through stage 4 chronic kidney disease, or unspecified chronic kidney disease: Secondary | ICD-10-CM | POA: Diagnosis present

## 2015-05-11 DIAGNOSIS — R188 Other ascites: Secondary | ICD-10-CM | POA: Diagnosis present

## 2015-05-11 DIAGNOSIS — J189 Pneumonia, unspecified organism: Secondary | ICD-10-CM

## 2015-05-11 DIAGNOSIS — T45515A Adverse effect of anticoagulants, initial encounter: Secondary | ICD-10-CM | POA: Diagnosis present

## 2015-05-11 DIAGNOSIS — K7469 Other cirrhosis of liver: Secondary | ICD-10-CM | POA: Diagnosis present

## 2015-05-11 DIAGNOSIS — K219 Gastro-esophageal reflux disease without esophagitis: Secondary | ICD-10-CM | POA: Diagnosis present

## 2015-05-11 DIAGNOSIS — Z9889 Other specified postprocedural states: Secondary | ICD-10-CM | POA: Diagnosis not present

## 2015-05-11 DIAGNOSIS — Z7901 Long term (current) use of anticoagulants: Secondary | ICD-10-CM | POA: Diagnosis not present

## 2015-05-11 DIAGNOSIS — R791 Abnormal coagulation profile: Secondary | ICD-10-CM | POA: Diagnosis present

## 2015-05-11 DIAGNOSIS — Z951 Presence of aortocoronary bypass graft: Secondary | ICD-10-CM

## 2015-05-11 DIAGNOSIS — Z87891 Personal history of nicotine dependence: Secondary | ICD-10-CM

## 2015-05-11 DIAGNOSIS — T462X5A Adverse effect of other antidysrhythmic drugs, initial encounter: Secondary | ICD-10-CM | POA: Diagnosis present

## 2015-05-11 DIAGNOSIS — Z809 Family history of malignant neoplasm, unspecified: Secondary | ICD-10-CM

## 2015-05-11 DIAGNOSIS — I482 Chronic atrial fibrillation: Secondary | ICD-10-CM | POA: Diagnosis present

## 2015-05-11 DIAGNOSIS — N289 Disorder of kidney and ureter, unspecified: Secondary | ICD-10-CM

## 2015-05-11 DIAGNOSIS — K2950 Unspecified chronic gastritis without bleeding: Secondary | ICD-10-CM | POA: Diagnosis present

## 2015-05-11 DIAGNOSIS — Z952 Presence of prosthetic heart valve: Secondary | ICD-10-CM | POA: Diagnosis not present

## 2015-05-11 DIAGNOSIS — E785 Hyperlipidemia, unspecified: Secondary | ICD-10-CM | POA: Diagnosis present

## 2015-05-11 DIAGNOSIS — N1330 Unspecified hydronephrosis: Secondary | ICD-10-CM

## 2015-05-11 DIAGNOSIS — I5032 Chronic diastolic (congestive) heart failure: Secondary | ICD-10-CM | POA: Diagnosis present

## 2015-05-11 HISTORY — DX: Heart failure, unspecified: I50.9

## 2015-05-11 LAB — URINALYSIS COMPLETE WITH MICROSCOPIC (ARMC ONLY)
BILIRUBIN URINE: NEGATIVE
Bacteria, UA: NONE SEEN
GLUCOSE, UA: NEGATIVE mg/dL
HGB URINE DIPSTICK: NEGATIVE
KETONES UR: NEGATIVE mg/dL
LEUKOCYTES UA: NEGATIVE
Nitrite: NEGATIVE
PH: 5 (ref 5.0–8.0)
Protein, ur: 30 mg/dL — AB
Specific Gravity, Urine: 1.021 (ref 1.005–1.030)

## 2015-05-11 LAB — RAPID HIV SCREEN (HIV 1/2 AB+AG)
HIV 1/2 ANTIBODIES: NONREACTIVE
HIV-1 P24 Antigen - HIV24: NONREACTIVE

## 2015-05-11 LAB — TROPONIN I
TROPONIN I: 0.05 ng/mL — AB (ref <0.031)
TROPONIN I: 0.04 ng/mL — AB (ref <0.031)
Troponin I: 0.04 ng/mL — ABNORMAL HIGH (ref <0.031)

## 2015-05-11 LAB — CBC WITH DIFFERENTIAL/PLATELET
Basophils Absolute: 0.1 10*3/uL (ref 0–0.1)
Eosinophils Absolute: 0.1 10*3/uL (ref 0–0.7)
Eosinophils Relative: 1 %
HEMATOCRIT: 37.4 % — AB (ref 40.0–52.0)
HEMOGLOBIN: 11.4 g/dL — AB (ref 13.0–18.0)
LYMPHS ABS: 0.9 10*3/uL — AB (ref 1.0–3.6)
MCH: 22.2 pg — AB (ref 26.0–34.0)
MCHC: 30.5 g/dL — AB (ref 32.0–36.0)
MCV: 72.9 fL — AB (ref 80.0–100.0)
Monocytes Absolute: 1.1 10*3/uL — ABNORMAL HIGH (ref 0.2–1.0)
NEUTROS ABS: 18.5 10*3/uL — AB (ref 1.4–6.5)
Platelets: 173 10*3/uL (ref 150–440)
RBC: 5.13 MIL/uL (ref 4.40–5.90)
RDW: 19.6 % — ABNORMAL HIGH (ref 11.5–14.5)
WBC: 20.8 10*3/uL — ABNORMAL HIGH (ref 3.8–10.6)

## 2015-05-11 LAB — PROTIME-INR
INR: 5.92
Prothrombin Time: 51 seconds — ABNORMAL HIGH (ref 11.4–15.0)

## 2015-05-11 LAB — TSH: TSH: 18.917 u[IU]/mL — ABNORMAL HIGH (ref 0.350–4.500)

## 2015-05-11 LAB — COMPREHENSIVE METABOLIC PANEL
ALBUMIN: 2.5 g/dL — AB (ref 3.5–5.0)
ALK PHOS: 459 U/L — AB (ref 38–126)
ALT: 33 U/L (ref 17–63)
ANION GAP: 6 (ref 5–15)
AST: 70 U/L — ABNORMAL HIGH (ref 15–41)
BILIRUBIN TOTAL: 1.6 mg/dL — AB (ref 0.3–1.2)
BUN: 22 mg/dL — ABNORMAL HIGH (ref 6–20)
CALCIUM: 7.7 mg/dL — AB (ref 8.9–10.3)
CO2: 23 mmol/L (ref 22–32)
Chloride: 103 mmol/L (ref 101–111)
Creatinine, Ser: 2.14 mg/dL — ABNORMAL HIGH (ref 0.61–1.24)
GFR calc Af Amer: 33 mL/min — ABNORMAL LOW (ref >60)
GFR, EST NON AFRICAN AMERICAN: 29 mL/min — AB (ref >60)
GLUCOSE: 187 mg/dL — AB (ref 65–99)
Potassium: 4.7 mmol/L (ref 3.5–5.1)
Sodium: 132 mmol/L — ABNORMAL LOW (ref 135–145)
TOTAL PROTEIN: 5.5 g/dL — AB (ref 6.5–8.1)

## 2015-05-11 LAB — ACETAMINOPHEN LEVEL: Acetaminophen (Tylenol), Serum: <10 ug/mL — ABNORMAL LOW (ref 10–30)

## 2015-05-11 LAB — LIPASE, BLOOD: Lipase: 36 U/L (ref 11–51)

## 2015-05-11 MED ORDER — ACETAMINOPHEN 325 MG PO TABS
650.0000 mg | ORAL_TABLET | Freq: Four times a day (QID) | ORAL | Status: DC | PRN
Start: 2015-05-11 — End: 2015-05-17
  Administered 2015-05-14: 650 mg via ORAL
  Filled 2015-05-11: qty 2

## 2015-05-11 MED ORDER — ALUM & MAG HYDROXIDE-SIMETH 200-200-20 MG/5ML PO SUSP: 30.0000 mL | Freq: Four times a day (QID) | ORAL | Status: DC | PRN

## 2015-05-11 MED ORDER — HYDROCODONE-ACETAMINOPHEN 5-325 MG PO TABS
1.0000 | ORAL_TABLET | ORAL | Status: DC | PRN
  Administered 2015-05-16: 1 via ORAL
  Filled 2015-05-11: qty 1

## 2015-05-11 MED ORDER — ROSUVASTATIN CALCIUM 5 MG PO TABS
10.0000 mg | ORAL_TABLET | ORAL | Status: DC
  Administered 2015-05-12 – 2015-05-17 (×5): 10 mg via ORAL
  Filled 2015-05-11 (×5): qty 2

## 2015-05-11 MED ORDER — CEFTRIAXONE SODIUM 1 G IJ SOLR
1.0000 g | INTRAMUSCULAR | Status: DC
  Administered 2015-05-11 – 2015-05-15 (×5): 1 g via INTRAVENOUS
  Filled 2015-05-11 (×6): qty 10

## 2015-05-11 MED ORDER — FUROSEMIDE 20 MG PO TABS: 20.0000 mg | ORAL_TABLET | ORAL | Status: DC

## 2015-05-11 MED ORDER — ISOSORBIDE DINITRATE 10 MG PO TABS
20.0000 mg | ORAL_TABLET | Freq: Every day | ORAL | Status: DC
  Administered 2015-05-12 – 2015-05-15 (×4): 20 mg via ORAL
  Filled 2015-05-11 (×5): qty 2

## 2015-05-11 MED ORDER — SENNOSIDES-DOCUSATE SODIUM 8.6-50 MG PO TABS: 1.0000 | ORAL_TABLET | Freq: Every evening | ORAL | Status: DC | PRN

## 2015-05-11 MED ORDER — IOHEXOL 240 MG/ML SOLN
25.0000 mL | Freq: Once | INTRAMUSCULAR | Status: AC | PRN
  Administered 2015-05-11: 25 mL via INTRAVENOUS

## 2015-05-11 MED ORDER — CLOPIDOGREL BISULFATE 75 MG PO TABS
75.0000 mg | ORAL_TABLET | Freq: Every day | ORAL | Status: DC
  Administered 2015-05-12: 75 mg via ORAL
  Filled 2015-05-11: qty 1

## 2015-05-11 MED ORDER — GABAPENTIN 300 MG PO CAPS
300.0000 mg | ORAL_CAPSULE | Freq: Every day | ORAL | Status: DC
  Administered 2015-05-11: 300 mg via ORAL
  Filled 2015-05-11: qty 1

## 2015-05-11 MED ORDER — ACETAMINOPHEN 650 MG RE SUPP: 650.0000 mg | Freq: Four times a day (QID) | RECTAL | Status: DC | PRN

## 2015-05-11 MED ORDER — AMIODARONE HCL 200 MG PO TABS: 100.0000 mg | ORAL_TABLET | Freq: Every day | ORAL | Status: DC

## 2015-05-11 MED ORDER — SODIUM CHLORIDE 0.9 % IV SOLN
INTRAVENOUS | Status: DC
  Administered 2015-05-11 – 2015-05-12 (×2): via INTRAVENOUS

## 2015-05-11 MED ORDER — ONDANSETRON HCL 4 MG PO TABS: 4.0000 mg | ORAL_TABLET | Freq: Four times a day (QID) | ORAL | Status: DC | PRN

## 2015-05-11 MED ORDER — WARFARIN 0.5 MG HALF TABLET
0.5000 mg | ORAL_TABLET | Freq: Every day | ORAL | Status: DC
  Filled 2015-05-11: qty 1

## 2015-05-11 MED ORDER — ALUM & MAG HYDROXIDE-SIMETH 200-200-20 MG/5ML PO SUSP: 15.0000 mL | Freq: Four times a day (QID) | ORAL | Status: DC | PRN

## 2015-05-11 MED ORDER — IOHEXOL 240 MG/ML SOLN
25.0000 mL | Freq: Once | INTRAMUSCULAR | Status: AC | PRN
  Administered 2015-05-11: 25 mL via ORAL

## 2015-05-11 MED ORDER — ONDANSETRON HCL 4 MG/2ML IJ SOLN: 4.0000 mg | Freq: Four times a day (QID) | INTRAMUSCULAR | Status: DC | PRN

## 2015-05-11 NOTE — Progress Notes (Signed)
ANTICOAGULATION CONSULT NOTE - Initial Consult  Pharmacy Consult for warfarin Indication: atrial fibrillation/history of bovine aortic valve  No Known Allergies  Patient Measurements: Height: 5\' 6"  (167.6 cm) Weight: 178 lb (80.74 kg) IBW/kg (Calculated) : 63.8  Vital Signs: Temp: 97.4 F (36.3 C) (12/21 0904) Temp Source: Oral (12/21 0904) BP: 122/70 mmHg (12/21 1230) Pulse Rate: 80 (12/21 1230)  Labs:  Recent Labs  05/11/15 0932  HGB 11.4*  HCT 37.4*  PLT 173  LABPROT 51.0*  INR 5.92*  CREATININE 2.14*  TROPONINI 0.04*   Estimated Creatinine Clearance: 30.2 mL/min (by C-G formula based on Cr of 2.14).  Medical History: Past Medical History  Diagnosis Date  . A-fib (HCC)   . Dementia   . Hypertension   . High cholesterol   . GERD (gastroesophageal reflux disease)   . Acid reflux   . CHF (congestive heart failure) (HCC)    Assessment: Pharmacy consulted to dose warfarin in this 74 year old male admitted with new onset ascites. Patient has a history of atrial fibrillation as well as an aortic bovine valve (per office visit note from 12/6). Patient's home regimen is warfarin 0.5 mg daily. Of note, patient is also taking amiodarone prior to admission, which will be continued.   Patient's INR is supratherapeutic on admission with an INR of 5.92.  Goal of Therapy:  INR 2-3 Monitor platelets by anticoagulation protocol: Yes   Plan:  Will hold warfarin today given elevated INR.  INR ordered for tomorrow morning, pharmacy will continue to monitor. Thank you for the consult.  Jodelle RedMary M Al Bracewell 05/11/2015,1:56 PM

## 2015-05-11 NOTE — Progress Notes (Signed)
Spoke to Dr. Juliene PinaMody about family concerns regarding patient with no IV fluids. Was not initially placed on IV fluids because patient admitted with ascites. NS to start at 50 because patient has not voided. Will do bladder scan, if patient retaining more than >300 mls will place foley catheter.

## 2015-05-11 NOTE — ED Notes (Signed)
Patient transported to CT 

## 2015-05-11 NOTE — Progress Notes (Signed)
Bladder scan was 178 mls pre-void. Pt able to void 80 mls once prompted. No foley will be placed at this time per MD order.

## 2015-05-11 NOTE — ED Notes (Signed)
Pt here with grand daughter that c/o abd swelling over the past few days, tightness in chest that started today with SOB.Marland Kitchen.4lb wt gain since yesterday.

## 2015-05-11 NOTE — ED Provider Notes (Signed)
[facility] Emergency Department Provider Note  L5 caveat: Review of systems and history is limited by dementia.   Time seen: ----------------------------------------- 9:34 AM on 05/11/2015 -----------------------------------------    I have reviewed the triage vital signs and the nursing notes.   HISTORY  Chief Complaint Shortness of Breath; Chest Pain; and Dizziness    HPI [name] is a 74 y.o. male brought to ER by his granddaughter who states he's had abdominal swelling over the last few days. He's had some tightness in his chest that started today with shortness of breath. He had a 4 pound weight gain last 24 hours. They should use a walker, lives in an apartment. Patient denies any complaints at this time.   Past Medical History  Diagnosis Date  . A-fib (HCC)   . Dementia   . Hypertension   . High cholesterol   . GERD (gastroesophageal reflux disease)   . Acid reflux   . CHF (congestive heart failure) [facility](HCC)     Patient Active Problem List   Diagnosis Date Noted  . Gastritis and gastroduodenitis   . Gastroesophageal reflux disease without esophagitis   . Chest pain 03/12/2015    Past Surgical History  Procedure Laterality Date  . Aortic valve replacement    . Cardiac bypass     . Coronary artery bypass graft    . Esophagogastroduodenoscopy (egd) with propofol N/A 05/03/2015    Procedure: ESOPHAGOGASTRODUODENOSCOPY (EGD) WITH PROPOFOL;  Surgeon: Midge Miniumarren Wohl, MD;  Location: [facility] ENDOSCOPY;  Service: Endoscopy;  Laterality: N/A;    Allergies Review of patient's allergies indicates no known allergies.  Social History Social History  Substance Use Topics  . Smoking status: Never Smoker   . Smokeless tobacco: Former NeurosurgeonUser    Types: Snuff, Chew  . Alcohol Use: No    Review of Systems Constitutional: Negative for fever. Positive for recent weight gain Eyes: Negative for visual changes. ENT: Negative for sore  throat. Cardiovascular: Negative for chest pain. Respiratory: Positive for recent shortness of breath Gastrointestinal: Positive for abdominal distention Genitourinary: Negative for dysuria. Musculoskeletal: Negative for back pain. Skin: Negative for rash. Neurological: Negative for headaches, focal weakness or numbness.  10-point ROS otherwise negative.  ____________________________________________   PHYSICAL EXAM:  VITAL SIGNS: ED Triage Vitals  Enc Vitals Group     BP 05/11/15 0904 112/63 mmHg     Pulse Rate 05/11/15 0904 82     Resp 05/11/15 0904 20     Temp 05/11/15 0904 97.4 F (36.3 C)     Temp Source 05/11/15 0904 Oral     SpO2 05/11/15 0904 95 %     Weight 05/11/15 0901 178 lb (80.74 kg)     Height 05/11/15 0901 5\' 6"  (1.676 m)     Head Cir --      Peak Flow --      Pain Score 05/11/15 0901 5     Pain Loc --      Pain Edu? --      Excl. in GC? --     Constitutional: Alert but disoriented. Chronically ill-appearing, no acute distress. Eyes: Conjunctivae are normal. PERRL. Normal extraocular movements. ENT   Head: Normocephalic and atraumatic.   Nose: No congestion/rhinnorhea.   Mouth/Throat: Mucous membranes are moist.   Neck: No stridor. Cardiovascular: Normal rate, regular rhythm. Normal and symmetric distal pulses are present in all extremities. No murmurs, rubs, or gallops. Respiratory: Normal respiratory effort without tachypnea nor retractions. Breath sounds are clear and equal  bilaterally. No wheezes/rales/rhonchi. Gastrointestinal: Abdomen is nontender but markedly distended. Questionable ascites. Normal bowel sounds. Musculoskeletal: Nontender with normal range of motion in all extremities. No joint effusions.  No lower extremity tenderness nor edema. Neurologic:  Normal speech and language. No gross focal neurologic deficits are appreciated.  Skin:  Skin is warm, dry and intact.  Psychiatric: Mood and affect are normal.   ____________________________________________  EKG: Interpreted by me. Normal sinus rhythm rate 83 bpm, normal PR interval, left axis deviation, left bundle branch block.  ____________________________________________  ED COURSE:  Pertinent labs & imaging results that were available during my care of the patient were reviewed by me and considered in my medical decision making (see chart for details). Patient with nonspecific weight gain and abdominal swelling. He will likely need CT imaging. ____________________________________________    LABS (pertinent positives/negatives)  Labs Reviewed  CBC WITH DIFFERENTIAL/PLATELET - Abnormal; Notable for the following:    WBC 20.8 (*)    Hemoglobin 11.4 (*)    HCT 37.4 (*)    MCV 72.9 (*)    MCH 22.2 (*)    MCHC 30.5 (*)    RDW 19.6 (*)    Neutro Abs 18.5 (*)    Lymphs Abs 0.9 (*)    Monocytes Absolute 1.1 (*)    All other components within normal limits  COMPREHENSIVE METABOLIC PANEL - Abnormal; Notable for the following:    Sodium 132 (*)    Glucose, Bld 187 (*)    BUN 22 (*)    Creatinine, Ser 2.14 (*)    Calcium 7.7 (*)    Total Protein 5.5 (*)    Albumin 2.5 (*)    AST 70 (*)    Alkaline Phosphatase 459 (*)    Total Bilirubin 1.6 (*)    GFR calc non Af Amer 29 (*)    GFR calc Af Amer 33 (*)    All other components within normal limits  TROPONIN I - Abnormal; Notable for the following:    Troponin I 0.04 (*)    All other components within normal limits  LIPASE, BLOOD  URINALYSIS COMPLETEWITH MICROSCOPIC ([facility] ONLY)  PROTIME-INR    RADIOLOGY Images were viewed by me  CT the abdomen and pelvis with contrast  IMPRESSION: Moderate to large volume ascites with splenomegaly. Findings raise the possibility of cirrhosis.  Cholelithiasis.  Linear platelike areas in the lung bases, scarring versus atelectasis. ____________________________________________  FINAL ASSESSMENT AND PLAN  Abdominal distention, cirrhosis,  leukocytosis, acute renal failure  Plan: Patient with labs and imaging as dictated above. He'll be given saline infusion to try to improve his kidney function. He has cirrhosis as evidence by CT scan here. He will please see GI consultation. I do not think he has spontaneous bacterial peritonitis, will need admission to the hospital for further workup.   Emily Filbert, MD   Emily Filbert, MD 05/11/15 [phone]

## 2015-05-11 NOTE — ED Notes (Signed)
Family states pt uses a walker, lives in apartment

## 2015-05-11 NOTE — ED Notes (Signed)
MD at bedside. 
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
## 2015-05-12 ENCOUNTER — Inpatient Hospital Stay: Payer: Medicare Other

## 2015-05-12 LAB — COMPREHENSIVE METABOLIC PANEL
ALBUMIN: 2.3 g/dL — AB (ref 3.5–5.0)
ALT: 31 U/L (ref 17–63)
ANION GAP: 5 (ref 5–15)
AST: 62 U/L — ABNORMAL HIGH (ref 15–41)
Alkaline Phosphatase: 428 U/L — ABNORMAL HIGH (ref 38–126)
BUN: 20 mg/dL (ref 6–20)
CO2: 24 mmol/L (ref 22–32)
Calcium: 7.2 mg/dL — ABNORMAL LOW (ref 8.9–10.3)
Chloride: 99 mmol/L — ABNORMAL LOW (ref 101–111)
Creatinine, Ser: 1.95 mg/dL — ABNORMAL HIGH (ref 0.61–1.24)
GFR calc Af Amer: 37 mL/min — ABNORMAL LOW (ref >60)
GFR calc non Af Amer: 32 mL/min — ABNORMAL LOW (ref >60)
GLUCOSE: 116 mg/dL — AB (ref 65–99)
POTASSIUM: 4.2 mmol/L (ref 3.5–5.1)
SODIUM: 128 mmol/L — AB (ref 135–145)
TOTAL PROTEIN: 5 g/dL — AB (ref 6.5–8.1)
Total Bilirubin: 1.2 mg/dL (ref 0.3–1.2)

## 2015-05-12 LAB — CBC
HEMATOCRIT: 34.8 % — AB (ref 40.0–52.0)
Hemoglobin: 10.8 g/dL — ABNORMAL LOW (ref 13.0–18.0)
MCH: 22.3 pg — AB (ref 26.0–34.0)
MCHC: 31 g/dL — AB (ref 32.0–36.0)
MCV: 72 fL — AB (ref 80.0–100.0)
PLATELETS: 152 10*3/uL (ref 150–440)
RBC: 4.84 MIL/uL (ref 4.40–5.90)
RDW: 19.5 % — AB (ref 11.5–14.5)
WBC: 17.7 10*3/uL — ABNORMAL HIGH (ref 3.8–10.6)

## 2015-05-12 LAB — TROPONIN I: TROPONIN I: 0.09 ng/mL — AB (ref <0.031)

## 2015-05-12 LAB — PROTEIN / CREATININE RATIO, URINE
CREATININE, URINE: 229 mg/dL
PROTEIN CREATININE RATIO: 0.38 mg/mg{creat} — AB (ref 0.00–0.15)
TOTAL PROTEIN, URINE: 86 mg/dL

## 2015-05-12 LAB — IRON AND TIBC
IRON: 16 ug/dL — AB (ref 45–182)
Saturation Ratios: 7 % — ABNORMAL LOW (ref 17.9–39.5)
TIBC: 231 ug/dL — ABNORMAL LOW (ref 250–450)
UIBC: 215 ug/dL

## 2015-05-12 LAB — PROTIME-INR
INR: 5.32
INR: 3.28
PROTHROMBIN TIME: 32.7 s — AB (ref 11.4–15.0)
Prothrombin Time: 47.1 seconds — ABNORMAL HIGH (ref 11.4–15.0)

## 2015-05-12 LAB — HEPATITIS PANEL, ACUTE
HCV Ab: <0.1 s/co ratio (ref 0.0–0.9)
HEP A IGM: NEGATIVE
HEP B C IGM: NEGATIVE
Hepatitis B Surface Ag: NEGATIVE

## 2015-05-12 LAB — ABO/RH: ABO/RH(D): O NEG

## 2015-05-12 LAB — FERRITIN: FERRITIN: 24 ng/mL (ref 24–336)

## 2015-05-12 LAB — AMMONIA: AMMONIA: 51 umol/L — AB (ref 9–35)

## 2015-05-12 MED ORDER — ENSURE ENLIVE PO LIQD
237.0000 mL | Freq: Two times a day (BID) | ORAL | Status: DC
  Administered 2015-05-14 – 2015-05-17 (×5): 237 mL via ORAL

## 2015-05-12 MED ORDER — LACTULOSE 10 GM/15ML PO SOLN
10.0000 g | Freq: Two times a day (BID) | ORAL | Status: DC
  Administered 2015-05-12 – 2015-05-13 (×3): 10 g via ORAL
  Filled 2015-05-12 (×3): qty 30

## 2015-05-12 MED ORDER — SODIUM CHLORIDE 0.9 % IV SOLN
Freq: Once | INTRAVENOUS | Status: AC
  Administered 2015-05-12: 13:00:00 via INTRAVENOUS

## 2015-05-12 MED ORDER — PREGABALIN 25 MG PO CAPS
50.0000 mg | ORAL_CAPSULE | Freq: Every day | ORAL | Status: DC
  Administered 2015-05-12 – 2015-05-16 (×5): 50 mg via ORAL
  Filled 2015-05-12 (×5): qty 2

## 2015-05-12 MED ORDER — VITAMIN K1 10 MG/ML IJ SOLN
10.0000 mg | Freq: Once | INTRAMUSCULAR | Status: AC
  Administered 2015-05-12: 10 mg via SUBCUTANEOUS
  Filled 2015-05-12: qty 1

## 2015-05-12 MED ORDER — SPIRONOLACTONE 25 MG PO TABS
25.0000 mg | ORAL_TABLET | Freq: Every day | ORAL | Status: DC
  Administered 2015-05-12 – 2015-05-17 (×6): 25 mg via ORAL
  Filled 2015-05-12 (×6): qty 1

## 2015-05-12 NOTE — Care Management (Signed)
Spoke with patient and grandaughter ([name])who was at the bedside. Patient lives alone in a apartment complex with other senior citizens. He is a patient of Dr Juel BurrowMasoud and granddaughter stated the patient was setup with home health previously then released. Granddaughter stated that she was unhappy with this agency (she cannot remember with name other than it starts with the letter A) he then stated that Dr Tilda FrancoMasouds office had setup another agency but she cannot recall this agency as well. I have asked granddaughter who stated she is the POA to retrieve this information for me. I have attempted to call Dr Tilda FrancoMasouds office but have not had my voicemail returned. Grand daughter stated that she is open to having another agency work with the patient at discharge if we wanted to set it tup at discharge. Will investigate. Patient has rolling walker at home and Granddaughter takes him to appointments. Granddaughter stated that she fills his medications and makes sure he takes them on schedule. Continue to follow.

## 2015-05-12 NOTE — Progress Notes (Signed)
FFP transfusion has been started. VSS. Will continue to monitor and increase rate to 24900ml/ hr.

## 2015-05-12 NOTE — Care Management (Signed)
Discussed with Dr Vanita PandaSanaini. PT consult will be placed after patient has procedure today.

## 2015-05-12 NOTE — Progress Notes (Signed)
ANTICOAGULATION CONSULT NOTE -FOLLOW UP   Pharmacy Consult for warfarin Indication: atrial fibrillation/history of bovine aortic valve  No Known Allergies  Patient Measurements: Height: 5\' 6"  (167.6 cm) Weight: 178 lb (80.74 kg) IBW/kg (Calculated) : 63.8  Vital Signs: Temp: 98.2 F (36.8 C) (12/22 0404) Temp Source: Oral (12/22 0404) BP: 104/47 mmHg (12/22 0404) Pulse Rate: 80 (12/22 0404)  Labs:  Recent Labs  05/11/15 0932 05/11/15 1601 05/11/15 2202 05/12/15 0514  HGB 11.4*  --   --  10.8*  HCT 37.4*  --   --  34.8*  PLT 173  --   --  152  LABPROT 51.0*  --   --  47.1*  INR 5.92*  --   --  5.32*  CREATININE 2.14*  --   --  1.95*  TROPONINI 0.04* 0.05* 0.04* 0.09*   Estimated Creatinine Clearance: 33.2 mL/min (by C-G formula based on Cr of 1.95).  Medical History: Past Medical History  Diagnosis Date  . A-fib (HCC)   . Dementia   . Hypertension   . High cholesterol   . GERD (gastroesophageal reflux disease)   . Acid reflux   . CHF (congestive heart failure) (HCC)    Assessment: Pharmacy consulted to dose warfarin in this 74 year old male admitted with new onset ascites. Patient has a history of atrial fibrillation as well as an aortic bovine valve (per office visit note from 12/6). Patient's home regimen is warfarin 0.5 mg daily. Of note, patient is also taking amiodarone prior to admission, which will be continued.   Patient's INR is supratherapeutic on admission with an INR of 5.92. 12/22: INR: 5.32  Goal of Therapy:  INR 2-3 Monitor platelets by anticoagulation protocol: Yes   Plan:  Will hold warfarin today given elevated INR. INR ordered with am labs.     Rosely Fernandez D 05/12/2015,10:59 AM

## 2015-05-12 NOTE — Consult Note (Signed)
Date: 05/12/2015                  Patient Name:  [name]  MRN: [id number]  DOB: 10/13/1940  Age / Sex: 74 y.o., male         PCP: Corky Downs, MD                 Service Requesting Consult: Internal medicine                 Reason for Consult: Acute renal failure            History of Present Illness: Patient is a 74 y.o. male with medical problems of dementia,atrial fibrillation, hypertension, GERD, newly discovered cirrhosis and ascites, cholelithiasis, aortic valve replacement, CABG, who was admitted to [facility] on 05/11/2015 for evaluation of worsening ascites.  Patient is not able to provide any meaningful information because of dementia.  His granddaughter Ms. Shanda Bumps, provides most of the information.  She is his power of attorney.  She Reports that for the past month or so patient has had trouble with his health  He was living independently about over a month ago. Then he started to have worsening of ascites and abdominal swelling.  He also developed lower extremity swelling.  His Lasix was adjusted as outpatient but that did not improve his condition.  He was therefore referred to the hospital for further evaluation.  CT scan of the abdomen reveals that he has cirrhosis, severe ascites, splenomegaly Nephrology consult has been requested for evaluation Patient appears to have a baseline creatinine of 1.45/GFR of 46 from about a month ago Admission creatinine was 2.14 Today's creatinine has improved slightly to 1.95    Medications: Outpatient medications: Prescriptions prior to admission  Medication Sig Dispense Refill Last Dose  . alum & mag hydroxide-simeth (MAALOX/MYLANTA) 200-200-20 MG/5ML suspension Take 15 mLs by mouth every 6 (six) hours as needed for indigestion or heartburn.   PRN  . amiodarone (PACERONE) 200 MG tablet Take 100 mg by mouth daily.    05/11/2015 at am  . clopidogrel (PLAVIX) 75 MG tablet Take 75 mg by mouth daily.   05/11/2015 at am  . dexlansoprazole  (DEXILANT) 60 MG capsule Take 60 mg by mouth daily.   05/11/2015 at am  . furosemide (LASIX) 20 MG tablet Take 20 mg by mouth every other day.    05/10/2015 at am  . gabapentin (NEURONTIN) 100 MG capsule Take 300 mg by mouth at bedtime.    05/10/2015 at pm  . isosorbide dinitrate (ISORDIL) 20 MG tablet Take 20 mg by mouth daily.   05/11/2015 at am  . rosuvastatin (CRESTOR) 10 MG tablet Take 10 mg by mouth every morning.    05/11/2015 at am  . warfarin (COUMADIN) 1 MG tablet Take 0.5 mg by mouth at bedtime.    05/10/2015 at pm    Current medications: Current Facility-Administered Medications  Medication Dose Route Frequency Provider Last Rate Last Dose  . acetaminophen (TYLENOL) tablet 650 mg  650 mg Oral Q6H PRN Adrian Saran, MD       Or  . acetaminophen (TYLENOL) suppository 650 mg  650 mg Rectal Q6H PRN Adrian Saran, MD      . alum & mag hydroxide-simeth (MAALOX/MYLANTA) 200-200-20 MG/5ML suspension 15 mL  15 mL Oral Q6H PRN Sital Mody, MD      . cefTRIAXone (ROCEPHIN) 1 g in dextrose 5 % 50 mL IVPB  1 g Intravenous Q24H Adrian Saran, MD  100 mL/hr at 05/12/15 1533 1 g at 05/12/15 1533  . [START ON 05/13/2015] feeding supplement (ENSURE ENLIVE) (ENSURE ENLIVE) liquid 237 mL  237 mL Oral BID BM Dreyson Mishkin, MD      . HYDROcodone-acetaminophen (NORCO/VICODIN) 5-325 MG per tablet 1-2 tablet  1-2 tablet Oral Q4H PRN Adrian Saran, MD      . isosorbide dinitrate (ISORDIL) tablet 20 mg  20 mg Oral Daily Adrian Saran, MD   20 mg at 05/12/15 1231  . lactulose (CHRONULAC) 10 GM/15ML solution 10 g  10 g Oral BID Bluford Kaufmann, PA-C   10 g at 05/12/15 1533  . ondansetron (ZOFRAN) tablet 4 mg  4 mg Oral Q6H PRN Adrian Saran, MD       Or  . ondansetron (ZOFRAN) injection 4 mg  4 mg Intravenous Q6H PRN Sital Mody, MD      . pregabalin (LYRICA) capsule 50 mg  50 mg Oral QHS Kristeena Meineke, MD      . rosuvastatin (CRESTOR) tablet 10 mg  10 mg Oral Evette Cristal, MD   10 mg at 05/12/15 1610  . senna-docusate  (Senokot-S) tablet 1 tablet  1 tablet Oral QHS PRN Adrian Saran, MD      . spironolactone (ALDACTONE) tablet 25 mg  25 mg Oral Daily Bluford Kaufmann, PA-C   25 mg at 05/12/15 1533      Allergies: No Known Allergies    Past Medical History: Past Medical History  Diagnosis Date  . A-fib (HCC)   . Dementia   . Hypertension   . High cholesterol   . GERD (gastroesophageal reflux disease)   . Acid reflux   . CHF (congestive heart failure) (HCC)      Past Surgical History: Past Surgical History  Procedure Laterality Date  . Aortic valve replacement    . Cardiac bypass     . Coronary artery bypass graft    . Esophagogastroduodenoscopy (egd) with propofol N/A 05/03/2015    Procedure: ESOPHAGOGASTRODUODENOSCOPY (EGD) WITH PROPOFOL;  Surgeon: Midge Minium, MD;  Location: [facility] ENDOSCOPY;  Service: Endoscopy;  Laterality: N/A;     Family History: Family History  Problem Relation Age of Onset  . Cancer Mother   . Hypertension Father   . CAD Father      Social History: Social History   Social History  . Marital Status: Single    Spouse Name: N/A  . Number of Children: N/A  . Years of Education: N/A   Occupational History  . Not on file.   Social History Main Topics  . Smoking status: Never Smoker   . Smokeless tobacco: Former Neurosurgeon    Types: Snuff, Chew  . Alcohol Use: No  . Drug Use: No  . Sexual Activity: Not on file   Other Topics Concern  . Not on file   Social History Narrative     Review of Systems:not reliable because patient has dementia Gen:  HEENT:  CV:  Resp:  GI: GU :  MS:  Derm:   Psych: Heme:  Neuro:  Endocrine  Vital Signs: Blood pressure 115/62, pulse 83, temperature 97.6 F (36.4 C), temperature source Oral, resp. rate 16, height  (1.676 m), weight 80.74 kg (178 lb), SpO2 95 %.   Intake/Output Summary (Last 24 hours) at 05/12/15 1718 Last data filed at 05/12/15 1528  Gross per 24 hour  Intake 1736.81 ml  Output    455 ml   Net 1281.81 ml    Weight trends: [employer]  Weights   05/11/15 0901  Weight: 80.74 kg (178 lb)    Physical Exam: General:  no acute distress, laying in the bed  HEENT Anicteric sclera,moist oral mucous membranes  Neck:  supple  Lungs: Normal respiratory effort, clear anteriorly and laterally  Heart::  irregular rhythm, no rub or gallop  Abdomen: Distended, tense ascites  Extremities:  2 to 3+ pitting edema  Neurologic:  alert, able to follow commands, dementia  Skin: No acute rashes  Access:   Foley:        Lab results: Basic Metabolic Panel:  Recent Labs Lab 05/11/15 0932 05/12/15 0514  NA 132* 128*  K 4.7 4.2  CL 103 99*  CO2 23 24  GLUCOSE 187* 116*  BUN 22* 20  CREATININE 2.14* 1.95*  CALCIUM 7.7* 7.2*    Liver Function Tests:  Recent Labs Lab 05/12/15 0514  AST 62*  ALT 31  ALKPHOS 428*  BILITOT 1.2  PROT 5.0*  ALBUMIN 2.3*    Recent Labs Lab 05/11/15 0932  LIPASE 36    Recent Labs Lab 05/12/15 1256  AMMONIA 51*    CBC:  Recent Labs Lab 05/11/15 0932 05/12/15 0514  WBC 20.8* 17.7*  NEUTROABS 18.5*  --   HGB 11.4* 10.8*  HCT 37.4* 34.8*  MCV 72.9* 72.0*  PLT 173 152    Cardiac Enzymes:  Recent Labs Lab 05/12/15 0514  TROPONINI 0.09*    BNP: Invalid input(s): POCBNP  CBG: No results for input(s): GLUCAP in the last 168 hours.  Microbiology: No results found for this or any previous visit (from the past 720 hour(s)).   Coagulation Studies:  Recent Labs  05/11/15 0932 05/12/15 0514 05/12/15 1501  LABPROT 51.0* 47.1* 32.7*  INR 5.92* 5.32* 3.28    Urinalysis:  Recent Labs  05/11/15 1815  COLORURINE AMBER*  LABSPEC 1.021  PHURINE 5.0  GLUCOSEU NEGATIVE  HGBUR NEGATIVE  BILIRUBINUR NEGATIVE  KETONESUR NEGATIVE  PROTEINUR 30*  NITRITE NEGATIVE  LEUKOCYTESUR NEGATIVE        Imaging: Ct Abdomen Pelvis Wo Contrast  05/11/2015  CLINICAL DATA:  Abdominal pain, leukocytosis. EXAM: CT ABDOMEN AND  PELVIS WITHOUT CONTRAST TECHNIQUE: Multidetector CT imaging of the abdomen and pelvis was performed following the standard protocol without IV contrast. COMPARISON:  CT 05/21/2011 FINDINGS: Linear platelike densities in both lung bases could reflect atelectasis or scarring. Prior CABG and valve replacement. No pleural effusions. Layering gallstones within the gallbladder. There is moderate to large volume ascites in the abdomen and pelvis. Spleen is mildly enlarged at 14 cm craniocaudal length. There is questionable subtle nodularity to the liver surface suggesting the possibility of cirrhosis. However, this is unclear. No visible focal hepatic abnormality. Pancreas, adrenals are unremarkable. Punctate nonobstructing bilateral renal stones. No ureteral stones or hydronephrosis. Urinary bladder and prostate are grossly unremarkable. Scattered sigmoid diverticula. No active diverticulitis. Stomach and small bowel are decompressed and unremarkable. No free air or adenopathy. Aorta is calcified, non aneurysmal. No acute bony abnormality or focal bone lesion. IMPRESSION: Moderate to large volume ascites with splenomegaly. Findings raise the possibility of cirrhosis. Cholelithiasis. Linear platelike areas in the lung bases, scarring versus atelectasis. Electronically Signed   By: Charlett Nose M.D.   On: 05/11/2015 12:29   Dg Chest 1 View  05/11/2015  CLINICAL DATA:  Pneumonia dementia, atrial fibrillation. Congestive heart failure. EXAM: CHEST 1 VIEW COMPARISON:  04/26/2015 FINDINGS: Prior CABG. Low lung volumes with bibasilar opacities, likely atelectasis or scarring. Heart is normal size. No visible  effusions. No acute bony abnormality. IMPRESSION: Low lung volumes.  Bibasilar scarring or atelectasis. Electronically Signed   By: Charlett NoseKevin  Dover M.D.   On: 05/11/2015 14:29   [place] Abdomen Complete  05/12/2015  CLINICAL DATA:  Elevated liver function studies EXAM: ABDOMEN ULTRASOUND COMPLETE COMPARISON:  Noncontrast  abdominal and pelvic CT scan of May 11, 2015 FINDINGS: Gallbladder: The gallbladder is adequately distended. There are multiple echogenic mobile shadowing and non shadowing stones. There is mild gallbladder wall thickening to 4.1 mm. There is no positive sonographic Murphy's sign. There is ascites present. Common bile duct: Diameter: 3.2 mm Liver: The liver appears shrunken. There is no focal mass. The echotexture is relatively homogeneous. The surface contour of the liver is fairly smooth. There is a large amount of ascites surrounding the liver. IVC: No abnormality visualized. Pancreas: Bowel gas obscures the pancreas. Spleen: The spleen is enlarged exhibiting a calculated volume of 131 cc. Right Kidney: Length: 8.4 cm. The renal cortical echotexture remains lower than that of the liver. There is mild diffuse cortical thinning. There is no hydronephrosis. Left Kidney: Length: 9.2 cm. The renal cortical echotexture is similar to that on the right. There is mild diffuse cortical thinning. There is no hydronephrosis. Abdominal aorta: Limited visualization of the mid and distal aorta and bifurcation due to bowel gas. The proximal aorta exhibits a diameter 2.1 cm. Other findings: There is a large amount of ascites throughout the abdomen. IMPRESSION: 1. Shrunken liver with large amount of ascites and marked splenomegaly. The findings are suspicious for cirrhosis. No hepatic mass is observed. 2. Gallstones without sonographic evidence of acute cholecystitis. 3. Mild renal cortical thinning diffusely, bilaterally. There is no acute abnormality of the kidneys. 4. Limited visualization of the pancreas and abdominal aorta. Electronically Signed   By: David  [place]azil[place]Jordan M.D.   On: 05/12/2015 12:36      Assessment & Plan: Pt is a 74 y.o. yo male with a PMHX of dementia,atrial fibrillation, hypertension, GERD, newly discovered cirrhosis and ascites, cholelithiasis, aortic valve replacement, CABG, , was admitted on  05/11/2015 with ascites, worsening lower extremity edema.  1.  Acute on chronic kidney disease stage III Baseline creatinine 1.45/GFR 46 Admission creatinine 2.19 today's creatinine 1.95 Acute renal failure is likely secondary to volume shifts related to ascites and cirrhosis - Recommend discontinuing IV fluids as patient is starting to get fluid overload and has developed some shortness of breath - continue to monitor renal function and electrolytes - May need IV albumin infusion after large volume paracentesis tomorrow  2.  Cirrhosis With tense ascites Paracentesis planned once coagulopathy is corrected  3. Hyponatremia Likely secondary to cirrhosis Continue salt and fluid restriction  He has discussed with family and Dr. Cherlynn KaiserSainani

## 2015-05-12 NOTE — Consult Note (Signed)
GI Inpatient Consult Note  Reason for Consult: cirrhosis   Attending Requesting Consult: [name]  History of Present Illness: [name] is a 74 y.o. male seen for evaluation of cirrhosis at the request of [name]. PMHx of atrial fib, CHF, dementia admitted for ascites. New patient to [facility]. He was recently by [name] and had EGD as below.    Most of history is obtained from granddaughter at bedside.Reports having some mild increase in abdominal grith over past month, initially attributed to GERD and constipation. Continued worsening over past few days and cause labored breathing which prompted ER visit. Has upper abdominal burning and GERD unresponsive to Dexilant. Denies N/V but appetite has been poor over past few months. No dysphagia. Weight up #4lbs this week above his baseline. Granddaughter endorses he has problems with memory, frequent falls, general decline in functional status recently particularly in past few weeks.   Normally having BM qd to qod. Takes milk of mag for symptoms PRN. Denies melena, rectal bleeding, diarrhea.   Last Colonoscopy: None prior.   Last Endoscopy: 05/03/15 - [name]  - small HH,  gastritis. Path-mild chronic gastritis, focal intestinal metaplasia, negative H.pylori    Past Medical History:  Past Medical History  Diagnosis Date  . A-fib ([place])   . Dementia   . Hypertension   . High cholesterol   . GERD (gastroesophageal reflux disease)   . Acid reflux   . CHF (congestive heart failure) ([place])     Problem List: Patient Active Problem List   Diagnosis Date Noted  . Ascites 05/11/2015  . Gastritis and gastroduodenitis   . Gastroesophageal reflux disease without esophagitis   . Chest pain 03/12/2015    Past Surgical History: Past Surgical History  Procedure Laterality Date  . Aortic valve replacement    . Cardiac bypass     . Coronary artery bypass graft    . Esophagogastroduodenoscopy (egd) with propofol N/A 05/03/2015    Procedure:  ESOPHAGOGASTRODUODENOSCOPY (EGD) WITH PROPOFOL;  Surgeon: Matthew Lame, MD;  Location: [facility] ENDOSCOPY;  Service: Endoscopy;  Laterality: N/A;    Allergies: No Known Allergies  Home Medications: Prescriptions prior to admission  Medication Sig Dispense Refill Last Dose  . alum & mag hydroxide-simeth (MAALOX/MYLANTA) 200-200-20 MG/5ML suspension Take 15 mLs by mouth every 6 (six) hours as needed for indigestion or heartburn.   PRN  . amiodarone (PACERONE) 200 MG tablet Take 100 mg by mouth daily.    05/11/2015 at am  . clopidogrel (PLAVIX) 75 MG tablet Take 75 mg by mouth daily.   05/11/2015 at am  . dexlansoprazole (DEXILANT) 60 MG capsule Take 60 mg by mouth daily.   05/11/2015 at am  . furosemide (LASIX) 20 MG tablet Take 20 mg by mouth every other day.    05/10/2015 at am  . gabapentin (NEURONTIN) 100 MG capsule Take 300 mg by mouth at bedtime.    05/10/2015 at pm  . isosorbide dinitrate (ISORDIL) 20 MG tablet Take 20 mg by mouth daily.   05/11/2015 at am  . rosuvastatin (CRESTOR) 10 MG tablet Take 10 mg by mouth every morning.    05/11/2015 at am  . warfarin (COUMADIN) 1 MG tablet Take 0.5 mg by mouth at bedtime.    05/10/2015 at pm   Home medication reconciliation was completed with the patient.   Scheduled Inpatient Medications:   . amiodarone  100 mg Oral Daily  . cefTRIAXone (ROCEPHIN)  IV  1 g Intravenous Q24H  .  clopidogrel  75 mg Oral Daily  . gabapentin  300 mg Oral QHS  . isosorbide dinitrate  20 mg Oral Daily  . rosuvastatin  10 mg Oral [id number]    Continuous Inpatient Infusions:   . sodium chloride 50 mL/hr at 05/11/15 1758    PRN Inpatient Medications:  acetaminophen **OR** acetaminophen, alum & mag hydroxide-simeth, HYDROcodone-acetaminophen, ondansetron **OR** ondansetron (ZOFRAN) IV, senna-docusate  Family History: Denies family history of CRC, colon polyps, and liver disease.   Social History:  Denies alcohol use. Chews tobacco. No illicit drug use.   Review  of Systems: Constitutional: + weight loss.  Eyes: No changes in vision. ENT: No oral lesions, sore throat.  GI: see HPI.  Heme/Lymph: No easy bruising.  CV: No chest pain.  GU: No hematuria.  Integumentary: No rashes.  Neuro: No headaches.  Psych: No depression/anxiety.  Endocrine: No heat/cold intolerance.  Allergic/Immunologic: No urticaria.  Resp: No cough, SOB.  Musculoskeletal: No joint swelling.    Physical Examination: BP 104/47 mmHg  Pulse 80  Temp(Src) 98.2 F (36.8 C) (Oral)  Resp 16  Ht 5' 6"  (1.676 m)  Wt 178 lb (80.74 kg)  BMI 28.74 kg/m2  SpO2 97% Gen: NAD, alert and oriented to person and place but not time, unable to state season or year.  HEENT: PEERLA, EOMI, Neck: supple, no JVD or thyromegaly Chest: CTA bilaterally, no wheezes, crackles, or other adventitious sounds CV: RRR, no m/g/c/r Abd: + tense ascites, non-tender to palpation, normal BS Ext: 1+ pitting edema Skin: + bruising  Lymph: no LAD  Data: Lab Results  Component Value Date   WBC 17.7* 05/12/2015   HGB 10.8* 05/12/2015   HCT 34.8* 05/12/2015   MCV 72.0* 05/12/2015   PLT 152 05/12/2015    Recent Labs Lab 05/11/15 0932 05/12/15 0514  HGB 11.4* 10.8*   Lab Results  Component Value Date   NA 128* 05/12/2015   K 4.2 05/12/2015   CL 99* 05/12/2015   CO2 24 05/12/2015   BUN 20 05/12/2015   CREATININE 1.95* 05/12/2015   Lab Results  Component Value Date   ALT 31 05/12/2015   AST 62* 05/12/2015   ALKPHOS 428* 05/12/2015   BILITOT 1.2 05/12/2015     Recent Labs Lab 05/12/15 0514  INR 5.32*   TSH - 18.9  CT a/p IMPRESSION: Moderate to large volume ascites with splenomegaly. Findings raise the possibility of cirrhosis.  Cholelithiasis.  Linear platelike areas in the lung bases, scarring versus Atelectasis.  Assessment/Plan:  [name] is a 74 y.o. male admitted for ascites.   1. Cirrhosis - on imaging. INR elevated, low albumin, normal platelets. AST > 2x  ALT but no known history of alcohol use. EGD earlier this month w/o varices. Elevated alk phos concerning for PBC, check labs for autoimmune, hemachromatosis as well.  Negative viral hepatitis panel. Outpatient will need [place] screening w/ AFP, hepatitis vaccines, etc.   2. Ascites - tense ascites on exam. Issues w/ tolerating diuretics (lasix 27m daily) due to dizziness and has elevated Cr will start on aldactone 223m Currently awaiting paracentesis but needs INR improved and he got Plavix this afternoon, hold plavix and hopefully paracentesis tomorrow. On rocephin for SBP prophylaxis.    3. HE/dementia -  Not orientated to time, daughter endorses forgetfulness worsening recently. Unclear if HE or dementia but ammonia elevated at 51 so will start low dose lactulose.   Recommendations:   1. Check AMA, SMA, ANA, ferritin, iron panel 2. Follow up  on paracentesis fluid studies 3. Lactulose  4. Stop Plavix for paracentesis 5. Start PPI  6. Start aldactone 90m qd.   Case discussed w/ Dr. OCandace Cruise   Thank you for the consult. Please call with questions or concerns.  JRonney Asters PA-C [place]

## 2015-05-12 NOTE — Progress Notes (Signed)
FFP transfusion is completed. VSS. Awaiting lab to draw PT/INR.

## 2015-05-12 NOTE — Consult Note (Signed)
Reason for Consult: fluid retention ascites evaluate for AFib coagulopathy congestive heart failure Referring Physician: Dr Benjie Karvonen  hospitalist Cardiologists Dr. Irene Shipper Mcdanel is an 74 y.o. male.  HPI:  The patient has history of atrial fibrillation on amiodarone and anticoagulation with Coumadin, patient has history of congestive heart failure diastolic dysfunction mild dementia hypertension hyperlipidemia GERD who recently presented with significant abdominal distention and ascites as well as possible cirrhosis. Patient denies a history of alcohol abuse but has had progressive abdominal distention in the last few weeks. Patient has a known history of coronary disease including coronary bypass surgery and aortic valve replacement. Patient has chronic atrial fibrillation and is on Coumadin for anticoagulation. 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 Patient denies any significant chest pain has progressive dyspnea shortness of breath expressly with exertion. The patient denies any fever chills or sweats  Past Medical History  Diagnosis Date  . A-fib ([place])   . Dementia   . Hypertension   . High cholesterol   . GERD (gastroesophageal reflux disease)   . Acid reflux   . CHF (congestive heart failure) [facility])     Past Surgical History  Procedure Laterality Date  . Aortic valve replacement    . Cardiac bypass     . Coronary artery bypass graft    . Esophagogastroduodenoscopy (egd) with propofol N/A 05/03/2015    Procedure: ESOPHAGOGASTRODUODENOSCOPY (EGD) WITH PROPOFOL;  Surgeon: Lucilla Lame, MD;  Location: [facility] ENDOSCOPY;  Service: Endoscopy;  Laterality: N/A;    Family History  Problem Relation Age of Onset  . Cancer Mother   . Hypertension Father   . CAD Father     Social History:  reports that he has never smoked. He has quit using smokeless tobacco. His smokeless tobacco use  included Snuff and Chew. He reports that he does not drink alcohol or use illicit drugs.  Allergies: No Known Allergies  Medications: I have reviewed the patient's current medications.  Results for orders placed or performed during the hospital encounter of 05/11/15 (from the past 48 hour(s))  CBC with Differential     Status: Abnormal   Collection Time: 05/11/15  9:32 AM  Result Value Ref Range   WBC 20.8 (H) 3.8 - 10.6 K/uL   RBC 5.13 4.40 - 5.90 MIL/uL   Hemoglobin 11.4 (L) 13.0 - 18.0 g/dL    Comment: RESULT REPEATED AND VERIFIED   HCT 37.4 (L) 40.0 - 52.0 %   MCV 72.9 (L) 80.0 - 100.0 fL   MCH 22.2 (L) 26.0 - 34.0 pg   MCHC 30.5 (L) 32.0 - 36.0 g/dL   RDW 19.6 (H) 11.5 - 14.5 %   Platelets 173 150 - 440 K/uL   Neutrophils Relative % 89% %   Neutro Abs 18.5 (H) 1.4 - 6.5 K/uL   Lymphocytes Relative 4% %   Lymphs Abs 0.9 (L) 1.0 - 3.6 K/uL   Monocytes Relative 5% %   Monocytes Absolute 1.1 (H) 0.2 - 1.0 K/uL   Eosinophils Relative 1% %   Eosinophils Absolute 0.1 0 - 0.7 K/uL   Basophils Relative 1% %   Basophils Absolute 0.1 0 - 0.1 K/uL  Comprehensive metabolic panel     Status: Abnormal   Collection Time: 05/11/15  9:32 AM  Result Value Ref Range   Sodium 132 (L) 135 - 145 mmol/L   Potassium 4.7 3.5 - 5.1 mmol/L   Chloride  103 101 - 111 mmol/L   CO2 23 22 - 32 mmol/L   Glucose, Bld 187 (H) 65 - 99 mg/dL   BUN 22 (H) 6 - 20 mg/dL   Creatinine, Ser 2.14 (H) 0.61 - 1.24 mg/dL   Calcium 7.7 (L) 8.9 - 10.3 mg/dL   Total Protein 5.5 (L) 6.5 - 8.1 g/dL   Albumin 2.5 (L) 3.5 - 5.0 g/dL   AST 70 (H) 15 - 41 U/L   ALT 33 17 - 63 U/L   Alkaline Phosphatase 459 (H) 38 - 126 U/L   Total Bilirubin 1.6 (H) 0.3 - 1.2 mg/dL   GFR calc non Af Amer 29 (L) >60 mL/min   GFR calc Af Amer 33 (L) >60 mL/min    Comment: (NOTE) The eGFR has been calculated using the CKD EPI equation. This calculation has not been validated in all clinical situations. eGFR's persistently <60 mL/min  signify possible Chronic Kidney Disease.    Anion gap 6 5 - 15  Lipase, blood     Status: None   Collection Time: 05/11/15  9:32 AM  Result Value Ref Range   Lipase 36 11 - 51 U/L  Troponin I     Status: Abnormal   Collection Time: 05/11/15  9:32 AM  Result Value Ref Range   Troponin I 0.04 (H) <0.031 ng/mL    Comment: READ BACK AND VERIFIED WITH KATHY COX AT 1056 05/11/15 DAS        PERSISTENTLY INCREASED TROPONIN VALUES IN THE RANGE OF 0.04-0.49 ng/mL CAN BE SEEN IN:       -UNSTABLE ANGINA       -CONGESTIVE HEART FAILURE       -MYOCARDITIS       -CHEST TRAUMA       -ARRYHTHMIAS       -LATE PRESENTING MYOCARDIAL INFARCTION       -COPD   CLINICAL FOLLOW-UP RECOMMENDED.   Protime-INR     Status: Abnormal   Collection Time: 05/11/15  9:32 AM  Result Value Ref Range   Prothrombin Time 51.0 (H) 11.4 - 15.0 seconds   INR 5.92 (HH)     Comment: RESULT REPEATED AND VERIFIED CRITICAL RESULT CALLED TO, READ BACK BY AND VERIFIED WITH: KENDALL MOFFITT 05/11/15 AT 1256 [place]   Hepatitis panel, acute     Status: None   Collection Time: 05/11/15  4:01 PM  Result Value Ref Range   Hepatitis B Surface Ag Negative Negative   HCV Ab <0.1 0.0 - 0.9 s/co ratio    Comment: (NOTE)                                  Negative:     < 0.8                             Indeterminate: 0.8 - 0.9                                  Positive:     > 0.9 The CDC recommends that a positive HCV antibody result be followed up with a HCV Nucleic Acid Amplification test (353299). Performed At: [facility] [place], [place] [id number] Lindon Romp MD QQ:[id number]    Hep A IgM Negative Negative   Hep B C IgM  Negative Negative  Rapid HIV screen (HIV 1/2 Ab+Ag)     Status: None   Collection Time: 05/11/15  4:01 PM  Result Value Ref Range   HIV-1 P24 Antigen - HIV24 NON REACTIVE NON REACTIVE   HIV 1/2 Antibodies NON REACTIVE NON REACTIVE   Interpretation (HIV Ag Ab)      A non  reactive test result means that HIV 1 or HIV 2 antibodies and HIV 1 p24 antigen were not detected in the specimen.  Acetaminophen level     Status: Abnormal   Collection Time: 05/11/15  4:01 PM  Result Value Ref Range   Acetaminophen (Tylenol), Serum <10 (L) 10 - 30 ug/mL    Comment:        THERAPEUTIC CONCENTRATIONS VARY SIGNIFICANTLY. A RANGE OF 10-30 ug/mL MAY BE AN EFFECTIVE CONCENTRATION FOR MANY PATIENTS. HOWEVER, SOME ARE BEST TREATED AT CONCENTRATIONS OUTSIDE THIS RANGE. ACETAMINOPHEN CONCENTRATIONS >150 ug/mL AT 4 HOURS AFTER INGESTION AND >50 ug/mL AT 12 HOURS AFTER INGESTION ARE OFTEN ASSOCIATED WITH TOXIC REACTIONS.   Troponin I     Status: Abnormal   Collection Time: 05/11/15  4:01 PM  Result Value Ref Range   Troponin I 0.05 (H) <0.031 ng/mL    Comment: PREVIOUS RESULT CALLED AT 1056 05/11/15 DAS...MLZ        PERSISTENTLY INCREASED TROPONIN VALUES IN THE RANGE OF 0.04-0.49 ng/mL CAN BE SEEN IN:       -UNSTABLE ANGINA       -CONGESTIVE HEART FAILURE       -MYOCARDITIS       -CHEST TRAUMA       -ARRYHTHMIAS       -LATE PRESENTING MYOCARDIAL INFARCTION       -COPD   CLINICAL FOLLOW-UP RECOMMENDED.   TSH     Status: Abnormal   Collection Time: 05/11/15  4:01 PM  Result Value Ref Range   TSH 18.917 (H) 0.350 - 4.500 uIU/mL  Urinalysis complete, with microscopic     Status: Abnormal   Collection Time: 05/11/15  6:15 PM  Result Value Ref Range   Color, Urine AMBER (A) YELLOW   APPearance CLOUDY (A) CLEAR   Glucose, UA NEGATIVE NEGATIVE mg/dL   Bilirubin Urine NEGATIVE NEGATIVE   Ketones, ur NEGATIVE NEGATIVE mg/dL   Specific Gravity, Urine 1.021 1.005 - 1.030   Hgb urine dipstick NEGATIVE NEGATIVE   pH 5.0 5.0 - 8.0   Protein, ur 30 (A) NEGATIVE mg/dL   Nitrite NEGATIVE NEGATIVE   Leukocytes, UA NEGATIVE NEGATIVE   RBC / HPF 0-5 0 - 5 RBC/hpf   WBC, UA 6-30 0 - 5 WBC/hpf   Bacteria, UA NONE SEEN NONE SEEN   Squamous Epithelial / LPF 0-5 (A) NONE SEEN    Mucous PRESENT   Troponin I     Status: Abnormal   Collection Time: 05/11/15 10:02 PM  Result Value Ref Range   Troponin I 0.04 (H) <0.031 ng/mL    Comment: PREVIOUS RESULT CALLED AT 1056  05/11/15 BY DAS.Marland KitchenMarland KitchenSDR        PERSISTENTLY INCREASED TROPONIN VALUES IN THE RANGE OF 0.04-0.49 ng/mL CAN BE SEEN IN:       -UNSTABLE ANGINA       -CONGESTIVE HEART FAILURE       -MYOCARDITIS       -CHEST TRAUMA       -ARRYHTHMIAS       -LATE PRESENTING MYOCARDIAL INFARCTION       -COPD   CLINICAL FOLLOW-UP RECOMMENDED.  Troponin I     Status: Abnormal   Collection Time: 05/12/15  5:14 AM  Result Value Ref Range   Troponin I 0.09 (H) <0.031 ng/mL    Comment: PREVIOUS RESULT CALLED AT 1056 05/11/15.PMH        PERSISTENTLY INCREASED TROPONIN VALUES IN THE RANGE OF 0.04-0.49 ng/mL CAN BE SEEN IN:       -UNSTABLE ANGINA       -CONGESTIVE HEART FAILURE       -MYOCARDITIS       -CHEST TRAUMA       -ARRYHTHMIAS       -LATE PRESENTING MYOCARDIAL INFARCTION       -COPD   CLINICAL FOLLOW-UP RECOMMENDED.   Comprehensive metabolic panel     Status: Abnormal   Collection Time: 05/12/15  5:14 AM  Result Value Ref Range   Sodium 128 (L) 135 - 145 mmol/L   Potassium 4.2 3.5 - 5.1 mmol/L   Chloride 99 (L) 101 - 111 mmol/L   CO2 24 22 - 32 mmol/L   Glucose, Bld 116 (H) 65 - 99 mg/dL   BUN 20 6 - 20 mg/dL   Creatinine, Ser 1.95 (H) 0.61 - 1.24 mg/dL   Calcium 7.2 (L) 8.9 - 10.3 mg/dL   Total Protein 5.0 (L) 6.5 - 8.1 g/dL   Albumin 2.3 (L) 3.5 - 5.0 g/dL   AST 62 (H) 15 - 41 U/L   ALT 31 17 - 63 U/L   Alkaline Phosphatase 428 (H) 38 - 126 U/L   Total Bilirubin 1.2 0.3 - 1.2 mg/dL   GFR calc non Af Amer 32 (L) >60 mL/min   GFR calc Af Amer 37 (L) >60 mL/min    Comment: (NOTE) The eGFR has been calculated using the CKD EPI equation. This calculation has not been validated in all clinical situations. eGFR's persistently <60 mL/min signify possible Chronic Kidney Disease.    Anion gap 5 5  - 15  CBC     Status: Abnormal   Collection Time: 05/12/15  5:14 AM  Result Value Ref Range   WBC 17.7 (H) 3.8 - 10.6 K/uL   RBC 4.84 4.40 - 5.90 MIL/uL   Hemoglobin 10.8 (L) 13.0 - 18.0 g/dL   HCT 34.8 (L) 40.0 - 52.0 %   MCV 72.0 (L) 80.0 - 100.0 fL   MCH 22.3 (L) 26.0 - 34.0 pg   MCHC 31.0 (L) 32.0 - 36.0 g/dL   RDW 19.5 (H) 11.5 - 14.5 %   Platelets 152 150 - 440 K/uL  Protime-INR     Status: Abnormal   Collection Time: 05/12/15  5:14 AM  Result Value Ref Range   Prothrombin Time 47.1 (H) 11.4 - 15.0 seconds   INR 5.32 (HH)     Comment: RESULT REPEATED AND VERIFIED CRITICAL RESULT CALLED TO, READ BACK BY AND VERIFIED WITH: WHITNEY BIGELOW ON 05/12/15 AT 0709 BY QSD   ABO/Rh     Status: None   Collection Time: 05/12/15  5:14 AM  Result Value Ref Range   ABO/RH(D) O NEG     Ct Abdomen Pelvis Wo Contrast  05/11/2015  CLINICAL DATA:  Abdominal pain, leukocytosis. EXAM: CT ABDOMEN AND PELVIS WITHOUT CONTRAST TECHNIQUE: Multidetector CT imaging of the abdomen and pelvis was performed following the standard protocol without IV contrast. COMPARISON:  CT 05/21/2011 FINDINGS: Linear platelike densities in both lung bases could reflect atelectasis or scarring. Prior CABG and valve replacement. No pleural effusions. Layering gallstones within the gallbladder. There  is moderate to large volume ascites in the abdomen and pelvis. Spleen is mildly enlarged at 14 cm craniocaudal length. There is questionable subtle nodularity to the liver surface suggesting the possibility of cirrhosis. However, this is unclear. No visible focal hepatic abnormality. Pancreas, adrenals are unremarkable. Punctate nonobstructing bilateral renal stones. No ureteral stones or hydronephrosis. Urinary bladder and prostate are grossly unremarkable. Scattered sigmoid diverticula. No active diverticulitis. Stomach and small bowel are decompressed and unremarkable. No free air or adenopathy. Aorta is calcified, non aneurysmal.  No acute bony abnormality or focal bone lesion. IMPRESSION: Moderate to large volume ascites with splenomegaly. Findings raise the possibility of cirrhosis. Cholelithiasis. Linear platelike areas in the lung bases, scarring versus atelectasis. Electronically Signed   By: Rolm Baptise M.D.   On: 05/11/2015 12:29   Dg Chest 1 View  05/11/2015  CLINICAL DATA:  Pneumonia dementia, atrial fibrillation. Congestive heart failure. EXAM: CHEST 1 VIEW COMPARISON:  04/26/2015 FINDINGS: Prior CABG. Low lung volumes with bibasilar opacities, likely atelectasis or scarring. Heart is normal size. No visible effusions. No acute bony abnormality. IMPRESSION: Low lung volumes.  Bibasilar scarring or atelectasis. Electronically Signed   By: Rolm Baptise M.D.   On: 05/11/2015 14:29    Review of Systems  Constitutional: Positive for malaise/fatigue.  HENT: Positive for congestion.   Eyes: Negative.   Respiratory: Positive for shortness of breath.   Cardiovascular: Positive for orthopnea, claudication, leg swelling and PND.  Gastrointestinal: Positive for abdominal pain.  Genitourinary: Negative.   Musculoskeletal: Positive for myalgias and joint pain.  Skin: Negative.   Neurological: Positive for weakness.  Endo/Heme/Allergies: Negative.   Psychiatric/Behavioral: Positive for memory loss.   Blood pressure 104/47, pulse 80, temperature 98.2 F (36.8 C), temperature source Oral, resp. rate 16, height 5' 6"  (1.676 m), weight 80.74 kg (178 lb), SpO2 97 %. Physical Exam  Nursing note and vitals reviewed. Constitutional: He is oriented to person, place, and time. He appears well-developed and well-nourished.  HENT:  Head: Normocephalic and atraumatic.  Eyes: Conjunctivae and EOM are normal. Pupils are equal, round, and reactive to light.  Neck: Normal range of motion. Neck supple.  Cardiovascular: Normal rate, regular rhythm and normal heart sounds.   Respiratory: Effort normal and breath sounds normal.  GI: He  exhibits distension. There is tenderness.  Musculoskeletal: Normal range of motion.  Neurological: He is alert and oriented to person, place, and time. He has normal reflexes.  Skin: Skin is warm and dry.  Psychiatric: He has a normal mood and affect. His speech is normal and behavior is normal. Judgment and thought content normal. Cognition and memory are impaired. He exhibits abnormal recent memory.    Assessment/Plan: Atrial fibrillation  aortic valve replacement  murmur  coronary artery disease  coronary artery bypass surgery  congestive heart failure diastolic dysfunction  hyper lipidemia  hypertension  dementia  cirrhosis of the liver  ascites  coagulopathy secondary to Coumadin and possibly  Cirrhosis   Elevated white count . PLAN  preop for paracentesis  recommend vitamin K  to correct coagulopathy prior to paracentesis  discontinue amiodarone for now because of possible  Cirrhosis  agree with echocardiogram for assessment of aortic valve and possible heart failure  diuretic therapy because of fluid retention ascites possible leg  hold Crestor because of possible cirrhosis for now  consider follow-up in GI consultation for evaluation of possible cirrhosis and ascites  consider a lactulose to help with possible encephalopathy  consider reducing Tylenol dose because of  possible cirrhosis  agree with Imdur or therapy for possible angina Hold Plavix because of coagulopathy   recommend low-dose beta-blockade therapy for rate control once amiodarone is discontinued  agree with broad-spectrum antibiotic therapy with elevated white count possible infection  no significant evidence of congestive heart failure continue low-dose diuretics for fluid     Ociel Retherford D. 05/12/2015, 11:47 AM

## 2015-05-12 NOTE — Consult Note (Signed)
  Pt seen and examined. Please see J.Bridges' notes for full details. Pt with dementia. Unable to provide much hx. Granddaughter available for details. Hx of CAD and A fib on chronic coumadin. Due to increasing dizziness, daily lasix dose was changed from 20 mg daily to 20 mg QOD. As a result, increasing abdominal girth, peripheral edema, and SOB. CXR shows atelectasis. Both CT and U/S show cirrhotic liver with large ascites. Needs both diagnostic and therapeutic paracentesis,but INR too high from coumadin. Pt given Vit K and ordered FFP. Hx of gastritis on previous EGD. Have f/u with Dr. Allen Norris on 05/24/2015. No alcohol use. AST> ALT. ALk phos high. Low albumin. Hep A/B/C neg so far. Please order ANA/ASMA/AMA/Fe/ferritin. Correct hypothyroidism. Awaiting Echo results. Will need more aggressive diuretics, i.e. Daily lasix if does not cause too much dizziness, or add aldactone to lasix. Diuretic regimen will need to be coordinated with cardiology. Will follow. Thanks.

## 2015-05-12 NOTE — Progress Notes (Signed)
[facility] [place] at [facility]   PATIENT NAME: Matthew LeydenJoe Blanchard    MR#:  [id number]  DATE OF BIRTH:  09/01/1940  SUBJECTIVE:   Patient here due to shortness of breath and increased abdominal girth. Noted to have ascites and also liver cirrhosis. Patient himself has dementia therefore most of the history obtained from the granddaughter at bedside.  REVIEW OF SYSTEMS:    Review of Systems  Unable to perform ROS: mental acuity    Nutrition: Regular Tolerating Diet: Yes Tolerating PT:  Await Eval.      DRUG ALLERGIES:  No Known Allergies  VITALS:  Blood pressure 115/62, pulse 83, temperature 97.6 F (36.4 C), temperature source Oral, resp. rate 16, height 5\' 6"  (1.676 m), weight 80.74 kg (178 lb), SpO2 95 %.  PHYSICAL EXAMINATION:   Physical Exam  GENERAL:  74 y.o.-year-old unkempt patient lying in the bed with no acute distress.  EYES: Pupils equal, round, reactive to light and accommodation. No scleral icterus. Extraocular muscles intact.  HEENT: Head atraumatic, normocephalic. Oropharynx and nasopharynx clear.  NECK:  Supple, no jugular venous distention. No thyroid enlargement, no tenderness.  LUNGS: Poor resp. effort, no wheezing, rales, rhonchi. No use of accessory muscles of respiration.  CARDIOVASCULAR: S1, S2 normal. No murmurs, rubs, or gallops.  ABDOMEN: Soft, protuberant/distended. Positive fluid wave or ascites. Bowel sounds present. No organomegaly or mass.  EXTREMITIES: No cyanosis, clubbing, +1-2 edema b/l.    NEUROLOGIC: Cranial nerves II through XII are intact. No focal Motor or sensory deficits b/l.   PSYCHIATRIC: The patient is alert and oriented x 1.  Good affect.  SKIN: No obvious rash, lesion, or ulcer.    LABORATORY PANEL:   CBC  Recent Labs Lab 05/12/15 0514  WBC 17.7*  HGB 10.8*  HCT 34.8*  PLT 152    ------------------------------------------------------------------------------------------------------------------  Chemistries   Recent Labs Lab 05/12/15 0514  NA 128*  K 4.2  CL 99*  CO2 24  GLUCOSE 116*  BUN 20  CREATININE 1.95*  CALCIUM 7.2*  AST 62*  ALT 31  ALKPHOS 428*  BILITOT 1.2   ------------------------------------------------------------------------------------------------------------------  Cardiac Enzymes  Recent Labs Lab 05/12/15 0514  TROPONINI 0.09*   ------------------------------------------------------------------------------------------------------------------  RADIOLOGY:  Ct Abdomen Pelvis Wo Contrast  05/11/2015  CLINICAL DATA:  Abdominal pain, leukocytosis. EXAM: CT ABDOMEN AND PELVIS WITHOUT CONTRAST TECHNIQUE: Multidetector CT imaging of the abdomen and pelvis was performed following the standard protocol without IV contrast. COMPARISON:  CT 05/21/2011 FINDINGS: Linear platelike densities in both lung bases could reflect atelectasis or scarring. Prior CABG and valve replacement. No pleural effusions. Layering gallstones within the gallbladder. There is moderate to large volume ascites in the abdomen and pelvis. Spleen is mildly enlarged at 14 cm craniocaudal length. There is questionable subtle nodularity to the liver surface suggesting the possibility of cirrhosis. However, this is unclear. No visible focal hepatic abnormality. Pancreas, adrenals are unremarkable. Punctate nonobstructing bilateral renal stones. No ureteral stones or hydronephrosis. Urinary bladder and prostate are grossly unremarkable. Scattered sigmoid diverticula. No active diverticulitis. Stomach and small bowel are decompressed and unremarkable. No free air or adenopathy. Aorta is calcified, non aneurysmal. No acute bony abnormality or focal bone lesion. IMPRESSION: Moderate to large volume ascites with splenomegaly. Findings raise the possibility of cirrhosis. Cholelithiasis.  Linear platelike areas in the lung bases, scarring versus atelectasis. Electronically Signed   By: Charlett NoseKevin  Dover M.D.   On: 05/11/2015 12:29   Dg Chest 1 View  05/11/2015  CLINICAL  DATA:  Pneumonia dementia, atrial fibrillation. Congestive heart failure. EXAM: CHEST 1 VIEW COMPARISON:  04/26/2015 FINDINGS: Prior CABG. Low lung volumes with bibasilar opacities, likely atelectasis or scarring. Heart is normal size. No visible effusions. No acute bony abnormality. IMPRESSION: Low lung volumes.  Bibasilar scarring or atelectasis. Electronically Signed   By: Charlett Nose M.D.   On: 05/11/2015 14:29   US Abdomen Complete  05/12/2015  CLINICAL DATA:  Elevated liver function studies EXAM: ABDOMEN ULTRASOUND COMPLETE COMPARISON:  Noncontrast abdominal and pelvic CT scan of May 11, 2015 FINDINGS: Gallbladder: The gallbladder is adequately distended. There are multiple echogenic mobile shadowing and non shadowing stones. There is mild gallbladder wall thickening to 4.1 mm. There is no positive sonographic Murphy's sign. There is ascites present. Common bile duct: Diameter: 3.2 mm Liver: The liver appears shrunken. There is no focal mass. The echotexture is relatively homogeneous. The surface contour of the liver is fairly smooth. There is a large amount of ascites surrounding the liver. IVC: No abnormality visualized. Pancreas: Bowel gas obscures the pancreas. Spleen: The spleen is enlarged exhibiting a calculated volume of 131 cc. Right Kidney: Length: 8.4 cm. The renal cortical echotexture remains lower than that of the liver. There is mild diffuse cortical thinning. There is no hydronephrosis. Left Kidney: Length: 9.2 cm. The renal cortical echotexture is similar to that on the right. There is mild diffuse cortical thinning. There is no hydronephrosis. Abdominal aorta: Limited visualization of the mid and distal aorta and bifurcation due to bowel gas. The proximal aorta exhibits a diameter 2.1 cm. Other  findings: There is a large amount of ascites throughout the abdomen. IMPRESSION: 1. Shrunken liver with large amount of ascites and marked splenomegaly. The findings are suspicious for cirrhosis. No hepatic mass is observed. 2. Gallstones without sonographic evidence of acute cholecystitis. 3. Mild renal cortical thinning diffusely, bilaterally. There is no acute abnormality of the kidneys. 4. Limited visualization of the pancreas and abdominal aorta. Electronically Signed   By: David  Swaziland M.D.   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  #1 ascites-this is secondary to liver cirrhosis is seen on the CT of the abdomen and pelvis. -Patient is awaiting a diagnostic/therapeutic paracentesis. -Continue diuretics for now.  #2 liver cirrhosis-etiology unclear. Patient has no history of alcohol abuse. Hepatitis serologies are negative so far. HIV test is negative. -Seen by gastroenterology and they have initiated a autoimmune workup and will follow up. -Await diagnostic/therapeutic paracentesis for further workup.  #3 coagulopathy-likely a combination of use of Coumadin and also underlying liver disease. -I have given the patient vitamin K, FFP to reverse INR so we can get his paracentesis done -we'll follow PT/INR.  #4 history of chronic atrial fibrillation-hold amiodarone for now. -Rate controlled. Hold Coumadin given the coagulopathy.  #5 hyperlipidemia-continue Crestor.  #6 history of CHF-continue Aldactone, Imdur, hold Lasix given the acute renal failure. Patient clinically is not in CHF presently.  #7 acute renal failure-etiology unclear. Possible hepatorenal given patient's liver cirrhosis. -Await  further nephrology input.  Physical therapy evaluation once patient has had his paracentesis done.   All the records are reviewed and case discussed with Care Management/Social Workerr. Management plans discussed with the patient, family and they are in  agreement.  CODE STATUS: Full   DVT Prophylaxis: Teds and SCDs  TOTAL TIME TAKING CARE OF THIS PATIENT: 35 minutes.   POSSIBLE D/C IN 1-2 DAYS, DEPENDING ON CLINICAL CONDITION.   Houston Siren M.D on 05/12/2015 at 3:05 PM  Between 7am to 6pm - Pager - [phone]  After 6pm go to [email or web address] - password [email or web address] [facility]  [place] [facility]  Office  [phone]  CC: Primary care physician; Corky Downs, MD

## 2015-05-13 ENCOUNTER — Inpatient Hospital Stay: Payer: Medicare Other

## 2015-05-13 ENCOUNTER — Ambulatory Visit: Payer: Medicare Other

## 2015-05-13 LAB — PREPARE FRESH FROZEN PLASMA: Unit division: 0

## 2015-05-13 LAB — BODY FLUID CELL COUNT WITH DIFFERENTIAL
EOS FL: 0 %
Lymphs, Fluid: 19 %
MONOCYTE-MACROPHAGE-SEROUS FLUID: 21 %
Neutrophil Count, Fluid: 60 %
Other Cells, Fluid: 0 %
Total Nucleated Cell Count, Fluid: 41 cu mm

## 2015-05-13 LAB — PROTIME-INR
INR: 2.16
Prothrombin Time: 23.9 s — ABNORMAL HIGH (ref 11.4–15.0)

## 2015-05-13 LAB — COMPREHENSIVE METABOLIC PANEL
ALBUMIN: 2.4 g/dL — AB (ref 3.5–5.0)
ALT: 30 U/L (ref 17–63)
AST: 61 U/L — ABNORMAL HIGH (ref 15–41)
Alkaline Phosphatase: 437 U/L — ABNORMAL HIGH (ref 38–126)
Anion gap: 10 (ref 5–15)
BUN: 22 mg/dL — ABNORMAL HIGH (ref 6–20)
CHLORIDE: 101 mmol/L (ref 101–111)
CO2: 23 mmol/L (ref 22–32)
CREATININE: 1.83 mg/dL — AB (ref 0.61–1.24)
Calcium: 7.7 mg/dL — ABNORMAL LOW (ref 8.9–10.3)
GFR calc non Af Amer: 35 mL/min — ABNORMAL LOW (ref >60)
GFR, EST AFRICAN AMERICAN: 40 mL/min — AB (ref >60)
Glucose, Bld: 139 mg/dL — ABNORMAL HIGH (ref 65–99)
Potassium: 4.4 mmol/L (ref 3.5–5.1)
SODIUM: 134 mmol/L — AB (ref 135–145)
Total Bilirubin: 1.6 mg/dL — ABNORMAL HIGH (ref 0.3–1.2)
Total Protein: 5.3 g/dL — ABNORMAL LOW (ref 6.5–8.1)

## 2015-05-13 LAB — LACTATE DEHYDROGENASE, PLEURAL OR PERITONEAL FLUID: LD FL: 55 U/L — AB (ref 3–23)

## 2015-05-13 LAB — GLUCOSE, SEROUS FLUID: GLUCOSE FL: 146 mg/dL

## 2015-05-13 LAB — ANTI-SMOOTH MUSCLE ANTIBODY, IGG: F-ACTIN AB IGG: 5 U (ref 0–19)

## 2015-05-13 LAB — MITOCHONDRIAL ANTIBODIES: Mitochondrial M2 Ab, IgG: 6.1 Units (ref 0.0–20.0)

## 2015-05-13 LAB — ALBUMIN, FLUID (OTHER): Albumin, Fluid: <1 g/dL

## 2015-05-13 LAB — CBC
HCT: 33.9 % — ABNORMAL LOW (ref 40.0–52.0)
Hemoglobin: 10.4 g/dL — ABNORMAL LOW (ref 13.0–18.0)
MCH: 21.9 pg — AB (ref 26.0–34.0)
MCHC: 30.8 g/dL — ABNORMAL LOW (ref 32.0–36.0)
MCV: 71 fL — ABNORMAL LOW (ref 80.0–100.0)
PLATELETS: 153 10*3/uL (ref 150–440)
RBC: 4.77 MIL/uL (ref 4.40–5.90)
RDW: 19.6 % — ABNORMAL HIGH (ref 11.5–14.5)
WBC: 18 10*3/uL — AB (ref 3.8–10.6)

## 2015-05-13 LAB — PROTEIN, BODY FLUID

## 2015-05-13 LAB — T3, FREE: T3, Free: 1.6 pg/mL — ABNORMAL LOW (ref 2.0–4.4)

## 2015-05-13 LAB — ANTINUCLEAR ANTIBODIES, IFA: ANA Ab, IFA: NEGATIVE

## 2015-05-13 LAB — T4: T4, Total: 4.4 ug/dL — ABNORMAL LOW (ref 4.5–12.0)

## 2015-05-13 MED ORDER — PANTOPRAZOLE SODIUM 40 MG PO TBEC
40.0000 mg | DELAYED_RELEASE_TABLET | Freq: Two times a day (BID) | ORAL | Status: DC
Start: 2015-05-13 — End: 2015-05-17
  Administered 2015-05-13 – 2015-05-17 (×8): 40 mg via ORAL
  Filled 2015-05-13 (×8): qty 1

## 2015-05-13 MED ORDER — WARFARIN - PHARMACIST DOSING INPATIENT: Freq: Every day | Status: DC

## 2015-05-13 MED ORDER — LACTULOSE 10 GM/15ML PO SOLN
20.0000 g | Freq: Two times a day (BID) | ORAL | Status: DC
  Administered 2015-05-13 – 2015-05-14 (×2): 20 g via ORAL
  Filled 2015-05-13 (×2): qty 30

## 2015-05-13 MED ORDER — WARFARIN SODIUM 1 MG PO TABS: 0.5000 mg | ORAL_TABLET | Freq: Every day | ORAL | Status: DC

## 2015-05-13 NOTE — Plan of Care (Signed)
Problem: Nutrition: Goal: Adequate nutrition will be maintained Outcome: Not Progressing Pt is NPO   

## 2015-05-13 NOTE — Care Management Note (Signed)
Case Management Note  Patient Details  Name: Collene LeydenJoe Olenick MRN: [id number] Date of Birth: 10/02/1940  Subjective/Objective:    Mr Maiorana's POA, Valentina LucksJessica Turner, cell [phone], is requesting to talk to [facility] and another home health provider other than [facility] per she states she is dissatisfied with the PT services provided by [facility]. This Clinical research associatewriter called Aris EvertsKim Murphey [facility] who agreed to speak with Ms Mayford Knifeurner about [facility] services. Kim did report that if Mr Marina Goodellerry qualifies for [facility] services that [facility] would be unable to enroll him until February 2017. This Clinical research associatewriter provided Ms Mayford Knifeurner with a list of local home health providers. This writer thencalled Feliberto GottronJason Hinton from [facility] and requested that he speak with Ms Mayford Knifeurner about home health services provided by [facility]..                Action/Plan:   Expected Discharge Date:                  Expected Discharge Plan:     In-House Referral:     Discharge planning Services     Post Acute Care Choice:    Choice offered to:     DME Arranged:    DME Agency:     HH Arranged:    HH Agency:     Status of Service:     Medicare Important Message Given:  Yes Date Medicare IM Given:    Medicare IM give by:    Date Additional Medicare IM Given:    Additional Medicare Important Message give by:     If discussed at Long Length of Stay Meetings, dates discussed:    Additional Comments:  Jeanette Rauth A, RN 05/13/2015, 12:04 PM

## 2015-05-13 NOTE — OR Nursing (Signed)
Dr Loreta Avewagner IR informed of coags, he is ok with doing paracentesis if primary md re-ordered it today.

## 2015-05-13 NOTE — Care Management Important Message (Signed)
Important Message  Patient Details  Name: [name] MRN: [id number] Date of Birth: 12/15/1940   Medicare Important Message Given:  Yes    Anmarie Fukushima A, RN 05/13/2015, 8:33 AM

## 2015-05-13 NOTE — Progress Notes (Signed)
[facility] - [place] at [facility]   PATIENT NAME: [name]    MR#:  [id number]  DATE OF BIRTH:  Aug 21, 1940  SUBJECTIVE:   Patient here due to shortness of breath and increased abdominal girth. S/p Large volume paracentesis today and 5 L of fluid removed.  Clinically feels a bit better. No BM and granddaughter at bedside.  REVIEW OF SYSTEMS:    Review of Systems  Unable to perform ROS: mental acuity    Nutrition: Regular Tolerating Diet: Yes Tolerating PT:  Await Eval.     DRUG ALLERGIES:  No Known Allergies  VITALS:  Blood pressure 112/66, pulse 74, temperature 98 F (36.7 C), temperature source Oral, resp. rate 18, height  (1.676 m), weight 80.74 kg (178 lb), SpO2 98 %.  PHYSICAL EXAMINATION:   Physical Exam  GENERAL:  74 y.o.-year-old unkempt patient lying in the bed with no acute distress.  EYES: Pupils equal, round, reactive to light and accommodation. No scleral icterus. Extraocular muscles intact.  HEENT: Head atraumatic, normocephalic. Oropharynx and nasopharynx clear.  NECK:  Supple, no jugular venous distention. No thyroid enlargement, no tenderness.  LUNGS: Poor resp. effort, no wheezing, rales, rhonchi. No use of accessory muscles of respiration.  CARDIOVASCULAR: S1, S2 normal. No murmurs, rubs, or gallops.  ABDOMEN: Soft, protuberant/distended. Positive fluid wave or ascites. Bowel sounds present. No organomegaly or mass.  EXTREMITIES: No cyanosis, clubbing, +1-2 edema b/l.    NEUROLOGIC: Cranial nerves II through XII are intact. No focal Motor or sensory deficits b/l.   PSYCHIATRIC: The patient is alert and oriented x 1.  Good affect.  SKIN: No obvious rash, lesion, or ulcer.    LABORATORY PANEL:   CBC  Recent Labs Lab 05/13/15 0513  WBC 18.0*  HGB 10.4*  HCT 33.9*  PLT 153   ------------------------------------------------------------------------------------------------------------------  Chemistries   Recent  Labs Lab 05/13/15 0513  NA 134*  K 4.4  CL 101  CO2 23  GLUCOSE 139*  BUN 22*  CREATININE 1.83*  CALCIUM 7.7*  AST 61*  ALT 30  ALKPHOS 437*  BILITOT 1.6*   ------------------------------------------------------------------------------------------------------------------  Cardiac Enzymes  Recent Labs Lab 05/12/15 0514  TROPONINI 0.09*   ------------------------------------------------------------------------------------------------------------------  RADIOLOGY:  US Abdomen Complete  05/12/2015  CLINICAL DATA:  Elevated liver function studies EXAM: ABDOMEN ULTRASOUND COMPLETE COMPARISON:  Noncontrast abdominal and pelvic CT scan of May 11, 2015 FINDINGS: Gallbladder: The gallbladder is adequately distended. There are multiple echogenic mobile shadowing and non shadowing stones. There is mild gallbladder wall thickening to 4.1 mm. There is no positive sonographic Murphy's sign. There is ascites present. Common bile duct: Diameter: 3.2 mm Liver: The liver appears shrunken. There is no focal mass. The echotexture is relatively homogeneous. The surface contour of the liver is fairly smooth. There is a large amount of ascites surrounding the liver. IVC: No abnormality visualized. Pancreas: Bowel gas obscures the pancreas. Spleen: The spleen is enlarged exhibiting a calculated volume of 131 cc. Right Kidney: Length: 8.4 cm. The renal cortical echotexture remains lower than that of the liver. There is mild diffuse cortical thinning. There is no hydronephrosis. Left Kidney: Length: 9.2 cm. The renal cortical echotexture is similar to that on the right. There is mild diffuse cortical thinning. There is no hydronephrosis. Abdominal aorta: Limited visualization of the mid and distal aorta and bifurcation due to bowel gas. The proximal aorta exhibits a diameter 2.1 cm. Other findings: There is a large amount of ascites throughout the abdomen. IMPRESSION:  1. Shrunken liver with large amount of  ascites and marked splenomegaly. The findings are suspicious for cirrhosis. No hepatic mass is observed. 2. Gallstones without sonographic evidence of acute cholecystitis. 3. Mild renal cortical thinning diffusely, bilaterally. There is no acute abnormality of the kidneys. 4. Limited visualization of the pancreas and abdominal aorta. Electronically Signed   By: David  [place] M.D.   On: 05/12/2015 12:36   US Paracentesis  05/13/2015  CLINICAL DATA:  74 year old male with a history of ascites. He has been referred for diagnostic and therapeutic ultrasound-guided paracentesis. EXAM: ULTRASOUND GUIDED  PARACENTESIS COMPARISON:  CT 05/11/2015 PROCEDURE: An ultrasound guided paracentesis was thoroughly discussed with the patient and questions answered. The benefits, risks, alternatives and complications were also discussed. The patient understands and wishes to proceed with the procedure. Written consent was obtained. Ultrasound was performed to localize and mark an adequate pocket of fluid in the right lower quadrant of the abdomen. The area was then prepped and draped in the normal sterile fashion. 1% Lidocaine was used for local anesthesia. Under ultrasound guidance a Safe-T-Centesis catheter was introduced. Paracentesis was performed. The catheter was removed and a dressing applied. COMPLICATIONS: None. FINDINGS: A total of approximately 5.5 L of thin yellow fluid was removed. A fluid sample was sent for laboratory analysis. IMPRESSION: Status post ultrasound-guided paracentesis with 5.5 L of thin yellow fluid removed. Sample was sent for analysis. Signed, Yvone Neu. Loreta Ave, DO [facility] Electronically Signed   By: Gilmer Mor D.O.   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  #1 ascites-this is secondary to liver cirrhosis is seen on the CT of the abdomen and pelvis. -Status post large volume paracentesis and 5 L of fluid removed. -Continue Aldactone, Lasix. Patient SAAG < 1 and await further GI input.   #2 liver cirrhosis-etiology unclear. Patient has no history of alcohol abuse. Hepatitis serologies are negative so far. HIV test is negative. ANA and anti-mitochondrial ab is (-) too.  -?? Need for Liver biopsy.  -cont. Supportive care for now.    #3 coagulopathy-likely a combination of use of Coumadin and also underlying liver disease. -INR improved w/ FFP and vit K yesterday.  No acute bleeding and will monitor.   #4 history of chronic atrial fibrillation-hold amiodarone for now. -Rate controlled. Hold Coumadin given the coagulopathy.  #5 hyperlipidemia-continue Crestor.  #6 history of CHF-continue Aldactone, Imdur, hold Lasix given the acute renal failure. Patient clinically is not in CHF presently.  #7 acute renal failure - acute on chronic renal failure.  Baseline Cr. 1.45.   - likely related to volume shifts from ascites & cirrhosis and will monitor.  - hold IV fluids and cont. Diuretics as tolerated and will monitor.  - will need outpatient Nephro referral upon discharge.   Will get PT eval today.    All the records are reviewed and case discussed with Care Management/Social Workerr. Management plans discussed with the patient, family and they are in agreement.  CODE STATUS: Full   DVT Prophylaxis: Teds and SCDs  TOTAL TIME TAKING CARE OF THIS PATIENT: 35 minutes.   POSSIBLE D/C IN 1-2 DAYS, DEPENDING ON CLINICAL CONDITION.   Houston Siren M.D on 05/13/2015 at  3:09 PM  Between 7am to 6pm - Pager - [phone]  After 6pm go to [email or web address] - password [email or web address] [facility]  [facility] [place] [facility]  Office   [phone]  CC: Primary care physician; Corky DownsMASOUD, JAVED, MD

## 2015-05-13 NOTE — Progress Notes (Addendum)
ANTICOAGULATION CONSULT NOTE -FOLLOW UP   Pharmacy Consult for warfarin Indication: atrial fibrillation/history of bovine aortic valve  No Known Allergies  Patient Measurements: Height: 5\' 6"  (167.6 cm) Weight: 178 lb (80.74 kg) IBW/kg (Calculated) : 63.8  Vital Signs: Temp: 98.5 F (36.9 C) (12/23 0613) Temp Source: Oral (12/23 82950613) BP: 109/70 mmHg (12/23 0613) Pulse Rate: 83 (12/23 0613)  Labs:  Recent Labs  05/11/15 0932 05/11/15 1601 05/11/15 2202 05/12/15 0514 05/12/15 1501 05/13/15 0513  HGB 11.4*  --   --  10.8*  --  10.4*  HCT 37.4*  --   --  34.8*  --  33.9*  PLT 173  --   --  152  --  153  LABPROT 51.0*  --   --  47.1* 32.7* 23.9*  INR 5.92*  --   --  5.32* 3.28 2.16  CREATININE 2.14*  --   --  1.95*  --  1.83*  TROPONINI 0.04* 0.05* 0.04* 0.09*  --   --    Estimated Creatinine Clearance: 35.4 mL/min (by C-G formula based on Cr of 1.83).  Medical History: Past Medical History  Diagnosis Date  . A-fib (HCC)   . Dementia   . Hypertension   . High cholesterol   . GERD (gastroesophageal reflux disease)   . Acid reflux   . CHF (congestive heart failure) (HCC)    Assessment: Pharmacy consulted to dose warfarin in this 74 year old male admitted with new onset ascites. Patient has a history of atrial fibrillation as well as an aortic bovine valve (per office visit note from 12/6). Patient's home regimen is warfarin 0.5 mg daily. Of note, patient is also taking amiodarone prior to admission, which will be continued.   Patient's INR is supratherapeutic on admission with an INR of 5.92. 12/22: INR: 5.32  12/23: INR 3.28  Goal of Therapy:  INR 2-3 Monitor platelets by anticoagulation protocol: Yes   Plan:  INR today is therapeutic @ 2.16. Will restart the home dose of warfarin 0.5 mg daily. Will follow INR closely and make adjustments to  therapy as necessary.   Demetrius Charityeldrin D. Bardia Wangerin, PharmD, BCPS Clinical Pharmacist  05/13/2015,9:19 AM

## 2015-05-13 NOTE — Progress Notes (Signed)
GI Inpatient Follow-up Note  Patient Identification: [name] is a 74 y.o. male  Subjective: feeling better since paracentesis yesterday, still has some milder abdominal pain diffusely, unable to comment if he had a BM yet. No nausea/vomiting. Ate all of his lunch per granddaughter. Discussed w/ family at bedside.     Scheduled Inpatient Medications:  . cefTRIAXone (ROCEPHIN)  IV  1 g Intravenous Q24H  . feeding supplement (ENSURE ENLIVE)  237 mL Oral BID BM  . isosorbide dinitrate  20 mg Oral Daily  . lactulose  10 g Oral BID  . pregabalin  50 mg Oral QHS  . rosuvastatin  10 mg Oral BH-q7a  . spironolactone  25 mg Oral Daily    Continuous Inpatient Infusions:     PRN Inpatient Medications:  acetaminophen **OR** acetaminophen, alum & mag hydroxide-simeth, HYDROcodone-acetaminophen, ondansetron **OR** ondansetron (ZOFRAN) IV, senna-docusate    Physical Examination: BP 112/66 mmHg  Pulse 74  Temp(Src) 98 F (36.7 C) (Oral)  Resp 18  Ht 5\' 6"  (1.676 m)  Wt 178 lb (80.74 kg)  BMI 28.74 kg/m2  SpO2 98% Gen: NAD, alert and oriented to person and place, not time. HEENT: PEERLA, EOMI, Neck: supple, no JVD or thyromegaly Chest: CTA bilaterally, no wheezes, crackles, or other adventitious sounds CV: RRR, no m/g/c/r Abd: paracentesis site dry and in tact. He is very minimally TTP diffusely over abd with normal BS. Non-distended.  Ext: no edema, well perfused with 2+ pulses, Skin: no rash or lesions noted Lymph: no LAD  Data: Lab Results  Component Value Date   WBC 18.0* 05/13/2015   HGB 10.4* 05/13/2015   HCT 33.9* 05/13/2015   MCV 71.0* 05/13/2015   PLT 153 05/13/2015    Recent Labs Lab 05/11/15 0932 05/12/15 0514 05/13/15 0513  HGB 11.4* 10.8* 10.4*   Lab Results  Component Value Date   NA 134* 05/13/2015   K 4.4 05/13/2015   CL 101 05/13/2015   CO2 23 05/13/2015   BUN 22* 05/13/2015   CREATININE 1.83* 05/13/2015   Lab Results  Component Value Date   ALT 30 05/13/2015   AST 61* 05/13/2015   ALKPHOS 437* 05/13/2015   BILITOT 1.6* 05/13/2015    Recent Labs Lab 05/13/15 0513  INR 2.16   Assessment/Plan: [name] is a 74 y.o. male admitted for ascites.   1. Cirrhosis - on imaging, INR improving since coumadin held. Serologic work up negative for autoimmune, PBC, hemachromatosis, viral hepatitis negativee. Consider NASH. Needs AFP as outpatient and liver imaging q336months.   2. Ascites - s/p 5.5L LVP yesterday. On aldactone 25mg  and Cr stable. Has had dizziness w/ lasix 20 but will likely need low dose to prevent recurrent ascites if tolerated. Fluid studies pending. Need to f/up on cell counts given his leukocytosis-on Rocephin. Low albumin results consistent w/ ascites from cirrhosis.   3. HE/Dementia - lactulose started yesterday. No BM charted in I&O and pt does not recall BM. Consider increasing lactulose dose tomorrow if not having 2-3 BM/day.   4. GERD - start pantoprazole while inpatient. Continue Dexilant as outpatient.   Recommendations:  1. Continue aldactone 25mg , consider adding lasix 20mg  tomrorow.  2. Await fluid studies 3. Pantoprazole 40mg  BID  *Case discussed w/ Dr. Bluford Kaufmannh.   Please call with questions or concerns.    Bluford KaufmannJanice Bridges, PA-C [facility]

## 2015-05-13 NOTE — Evaluation (Signed)
Physical Therapy Evaluation Patient Details Name: Matthew LeydenJoe Blanchard MRN: [id number] DOB: 02/04/1941 Today's Date: 05/13/2015   History of Present Illness  Patient is a 74 y/o male that presents with shortness of breath and increasing abdominal girth.   Clinical Impression  Patient is a 74 y/o gentlemen that presents s/p paracentesis that demonstrates decreased trunkal strength resulting in decreased bed mobility. Patient is able to complete transfers with RW, however ambulation distance is limited secondary to generalized weakness and fatigue. Patient appears relatively steady on his feet with RW, however after minimal distance he asks to return to room. Given that he lives alone, patient is not able to complete household mobility independently currently and would benefit from STR to increase his functional independence to return to home mobility.     Follow Up Recommendations SNF    Equipment Recommendations       Recommendations for Other Services       Precautions / Restrictions Precautions Precautions: Fall Restrictions Weight Bearing Restrictions: No      Mobility  Bed Mobility Overal bed mobility: Needs Assistance Bed Mobility: Supine to Sit     Supine to sit: Min assist;Mod assist     General bed mobility comments: Patient is able to bring his LEs off the EOB, however unable to bring his trunk off the bed surface secondary to generalized weakness.   Transfers Overall transfer level: Needs assistance Equipment used: Rolling walker (2 wheeled) Transfers: Sit to/from Stand Sit to Stand: Min guard         General transfer comment: Patient is able to complete sit to stand transfer with RW and use of UEs. Decreased timing, however no balance deficits.   Ambulation/Gait Ambulation/Gait assistance: Min guard Ambulation Distance (Feet): 65 Feet Assistive device: Rolling walker (2 wheeled) Gait Pattern/deviations: WFL(Within Functional Limits);Decreased step length -  left;Decreased step length - right   Gait velocity interpretation: <1.8 ft/sec, indicative of risk for recurrent falls General Gait Details: Patient initially ambulates with feet outside wheels of RW, with cuing he is able to correct, though step length is decreased bilaterally secondary to weakness.   Stairs            Wheelchair Mobility    Modified Rankin (Stroke Patients Only)       Balance Overall balance assessment: Needs assistance Sitting-balance support: Bilateral upper extremity supported Sitting balance-Leahy Scale: Good Sitting balance - Comments: No balance deficits.      Standing balance-Leahy Scale: Fair Standing balance comment: No obvious loss of balance, but poor RW technique initially, though patient is able to correct.                              Pertinent Vitals/Pain Pain Assessment:  (No pain reported, no faces made to indicate pain this session)    Home Living Family/patient expects to be discharged to:: Private residence Living Arrangements: Alone Available Help at Discharge:  (It sounds like family could assist intermittently but not consistently.) Type of Home: Apartment       Home Layout: One level Home Equipment: Walker - 2 wheels      Prior Function Level of Independence: Independent with assistive device(s)         Comments: Patient reports he has had "a few" falls in the last 12 months.      Hand Dominance        Extremity/Trunk Assessment   Upper Extremity Assessment: Overall WFL for tasks assessed  Lower Extremity Assessment: Overall WFL for tasks assessed         Communication   Communication: No difficulties  Cognition Arousal/Alertness: Awake/alert Behavior During Therapy: WFL for tasks assessed/performed Overall Cognitive Status: History of cognitive impairments - at baseline                      General Comments      Exercises        Assessment/Plan    PT  Assessment Patient needs continued PT services  PT Diagnosis Difficulty walking;Generalized weakness   PT Problem List Decreased strength;Decreased knowledge of use of DME;Decreased balance;Decreased mobility;Cardiopulmonary status limiting activity  PT Treatment Interventions DME instruction;Gait training;Stair training;Therapeutic activities;Therapeutic exercise;Balance training   PT Goals (Current goals can be found in the Care Plan section) Acute Rehab PT Goals Patient Stated Goal: To return home safely  PT Goal Formulation: With patient Time For Goal Achievement: 05/27/15 Potential to Achieve Goals: Good    Frequency Min 2X/week   Barriers to discharge Decreased caregiver support Patient lives alone.     Co-evaluation               End of Session Equipment Utilized During Treatment: Gait belt Activity Tolerance: Patient tolerated treatment well Patient left: in chair;with call bell/phone within reach;with chair alarm set Nurse Communication: Mobility status         Time: 1610-9604 PT Time Calculation (min) (ACUTE ONLY): 16 min   Charges:   PT Evaluation $Initial PT Evaluation Tier I: 1 Procedure     PT G Codes:       Kerin Ransom, PT, DPT    05/13/2015, 5:11 PM

## 2015-05-13 NOTE — Progress Notes (Signed)
Subjective:  Patient underwent paracentesis today 5.5 L were removed He states his breathing is a little better, abdomen is softer   Objective:  Vital signs in last 24 hours:  Temp:  [98 F (36.7 C)-98.5 F (36.9 C)] 98 F (36.7 C) (12/23 1123) Pulse Rate:  [74-86] 74 (12/23 1123) Resp:  [16-20] 18 (12/23 1123) BP: (104-125)/(57-70) 112/66 mmHg (12/23 1123) SpO2:  [93 %-98 %] 98 % (12/23 1123)  Weight change:  Filed Weights   05/11/15 0901  Weight: 80.74 kg (178 lb)    Intake/Output:    Intake/Output Summary (Last 24 hours) at 05/13/15 1518 Last data filed at 05/13/15 1300  Gross per 24 hour  Intake    480 ml  Output    350 ml  Net    130 ml     Physical Exam: General:  no acute distress, laying in the bed   HEENT  anicteric , moist oral mucous membranes   Neck  supple   Pulm/lungs  normal respiratory effort, clear to auscultation   CVS/Heart  irregular rhythm, no rub or gallop   Abdomen:   soft, distended, ascites   Extremities:  2+ pitting edema   Neurologic:  alert, following commands   Skin:  no acute rashes   Access:        Basic Metabolic Panel:   Recent Labs Lab 05/11/15 0932 05/12/15 0514 05/13/15 0513  NA 132* 128* 134*  K 4.7 4.2 4.4  CL 103 99* 101  CO2 23 24 23   GLUCOSE 187* 116* 139*  BUN 22* 20 22*  CREATININE 2.14* 1.95* 1.83*  CALCIUM 7.7* 7.2* 7.7*     CBC:  Recent Labs Lab 05/11/15 0932 05/12/15 0514 05/13/15 0513  WBC 20.8* 17.7* 18.0*  NEUTROABS 18.5*  --   --   HGB 11.4* 10.8* 10.4*  HCT 37.4* 34.8* 33.9*  MCV 72.9* 72.0* 71.0*  PLT 173 152 153      Microbiology:  No results found for this or any previous visit (from the past 720 hour(s)).  Coagulation Studies:  Recent Labs  05/11/15 0932 05/12/15 0514 05/12/15 1501 05/13/15 0513  LABPROT 51.0* 47.1* 32.7* 23.9*  INR 5.92* 5.32* 3.28 2.16    Urinalysis:  Recent Labs  05/11/15 1815  COLORURINE AMBER*  LABSPEC 1.021  PHURINE 5.0  GLUCOSEU  NEGATIVE  HGBUR NEGATIVE  BILIRUBINUR NEGATIVE  KETONESUR NEGATIVE  PROTEINUR 30*  NITRITE NEGATIVE  LEUKOCYTESUR NEGATIVE      Imaging: [place] Abdomen Complete  05/12/2015  CLINICAL DATA:  Elevated liver function studies EXAM: ABDOMEN ULTRASOUND COMPLETE COMPARISON:  Noncontrast abdominal and pelvic CT scan of May 11, 2015 FINDINGS: Gallbladder: The gallbladder is adequately distended. There are multiple echogenic mobile shadowing and non shadowing stones. There is mild gallbladder wall thickening to 4.1 mm. There is no positive sonographic Murphy's sign. There is ascites present. Common bile duct: Diameter: 3.2 mm Liver: The liver appears shrunken. There is no focal mass. The echotexture is relatively homogeneous. The surface contour of the liver is fairly smooth. There is a large amount of ascites surrounding the liver. IVC: No abnormality visualized. Pancreas: Bowel gas obscures the pancreas. Spleen: The spleen is enlarged exhibiting a calculated volume of 131 cc. Right Kidney: Length: 8.4 cm. The renal cortical echotexture remains lower than that of the liver. There is mild diffuse cortical thinning. There is no hydronephrosis. Left Kidney: Length: 9.2 cm. The renal cortical echotexture is similar to that on the right. There is mild diffuse cortical  thinning. There is no hydronephrosis. Abdominal aorta: Limited visualization of the mid and distal aorta and bifurcation due to bowel gas. The proximal aorta exhibits a diameter 2.1 cm. Other findings: There is a large amount of ascites throughout the abdomen. IMPRESSION: 1. Shrunken liver with large amount of ascites and marked splenomegaly. The findings are suspicious for cirrhosis. No hepatic mass is observed. 2. Gallstones without sonographic evidence of acute cholecystitis. 3. Mild renal cortical thinning diffusely, bilaterally. There is no acute abnormality of the kidneys. 4. Limited visualization of the pancreas and abdominal aorta.  Electronically Signed   By: David  [place]az[place]and M.D.   On: 05/12/2015 12:36   US Paracentesis  05/13/2015  CLINICAL DATA:  74 year old male with a history of ascites. He has been referred for diagnostic and therapeutic ultrasound-guided paracentesis. EXAM: ULTRASOUND GUIDED  PARACENTESIS COMPARISON:  CT 05/11/2015 PROCEDURE: An ultrasound guided paracentesis was thoroughly discussed with the patient and questions answered. The benefits, risks, alternatives and complications were also discussed. The patient understands and wishes to proceed with the procedure. Written consent was obtained. Ultrasound was performed to localize and mark an adequate pocket of fluid in the right lower quadrant of the abdomen. The area was then prepped and draped in the normal sterile fashion. 1% Lidocaine was used for local anesthesia. Under ultrasound guidance a Safe-T-Centesis catheter was introduced. Paracentesis was performed. The catheter was removed and a dressing applied. COMPLICATIONS: None. FINDINGS: A total of approximately 5.5 L of thin yellow fluid was removed. A fluid sample was sent for laboratory analysis. IMPRESSION: Status post ultrasound-guided paracentesis with 5.5 L of thin yellow fluid removed. Sample was sent for analysis. Signed, Yvone Neu. Loreta Ave, DO [facility] Electronically Signed   By: Gilmer Mor D.O.   On: 05/13/2015 11:11     Medications:     . cefTRIAXone (ROCEPHIN)  IV  1 g Intravenous Q24H  . feeding supplement (ENSURE ENLIVE)  237 mL Oral BID BM  . isosorbide dinitrate  20 mg Oral Daily  . lactulose  20 g Oral BID  . pantoprazole  40 mg Oral BID AC  . pregabalin  50 mg Oral QHS  . rosuvastatin  10 mg Oral BH-q7a  . spironolactone  25 mg Oral Daily   acetaminophen **OR** acetaminophen, alum & mag hydroxide-simeth, HYDROcodone-acetaminophen, ondansetron **OR** ondansetron (ZOFRAN) IV, senna-docusate  Assessment/ Plan:  74 y.o.  male with a PMHX of dementia,atrial fibrillation, hypertension, GERD, newly discovered cirrhosis and ascites, cholelithiasis, aortic valve replacement, CABG, , was admitted on 05/11/2015 with ascites, worsening lower extremity edema.  1. Acute on chronic kidney disease stage III Baseline creatinine 1.45/GFR 46 Admission creatinine 2.19 today's creatinine 1.83 Acute renal failure is likely secondary to volume shifts related to ascites and cirrhosis - continue to monitor renal function and electrolytes  2. Cirrhosis 5.5 L removed via paracentesis today December 23  3. Hyponatremia Likely secondary to cirrhosis Continue salt and fluid restriction    LOS: 2 Tsering Leaman 12/23/20163:18 PM

## 2015-05-13 NOTE — Progress Notes (Signed)
Interventional Radiology Procedure Note  Procedure: US guided para  Complications: None Recommendations:  - Ok to shower tomorrow -  Labs sent - Routine care   Signed,  Yvone NeuJaime S. Loreta AveWagner, DO

## 2015-05-13 NOTE — Consult Note (Signed)
  Please see J. Peggye PittRichards' notes. Overall better after paracentesis. Cell count wnl. Echo showed nl EF and mild MR.  Renal function improving. Granddaughter did not know why pt was on both coumadin and plavix. Will need both lasix/aldactone once discharged but need to continue to moniter renal function and make sure dizziness does not get worse. Also, need to watch fluid and Na intake upon discharge. Will need to consider hepatology referral later. Dr. Servando SnareWohl to check on pt over the weekend. Thanks.

## 2015-05-14 DIAGNOSIS — R188 Other ascites: Principal | ICD-10-CM

## 2015-05-14 DIAGNOSIS — K7460 Unspecified cirrhosis of liver: Secondary | ICD-10-CM | POA: Insufficient documentation

## 2015-05-14 LAB — COMPREHENSIVE METABOLIC PANEL
ALBUMIN: 2.2 g/dL — AB (ref 3.5–5.0)
ALT: 31 U/L (ref 17–63)
AST: 62 U/L — AB (ref 15–41)
Alkaline Phosphatase: 434 U/L — ABNORMAL HIGH (ref 38–126)
Anion gap: 7 (ref 5–15)
BUN: 21 mg/dL — AB (ref 6–20)
CHLORIDE: 104 mmol/L (ref 101–111)
CO2: 23 mmol/L (ref 22–32)
Calcium: 7.6 mg/dL — ABNORMAL LOW (ref 8.9–10.3)
Creatinine, Ser: 1.89 mg/dL — ABNORMAL HIGH (ref 0.61–1.24)
GFR calc Af Amer: 39 mL/min — ABNORMAL LOW (ref >60)
GFR calc non Af Amer: 33 mL/min — ABNORMAL LOW (ref >60)
GLUCOSE: 126 mg/dL — AB (ref 65–99)
POTASSIUM: 4.4 mmol/L (ref 3.5–5.1)
Sodium: 134 mmol/L — ABNORMAL LOW (ref 135–145)
Total Bilirubin: 1.1 mg/dL (ref 0.3–1.2)
Total Protein: 4.8 g/dL — ABNORMAL LOW (ref 6.5–8.1)

## 2015-05-14 LAB — CBC
HEMATOCRIT: 32.7 % — AB (ref 40.0–52.0)
Hemoglobin: 10.5 g/dL — ABNORMAL LOW (ref 13.0–18.0)
MCH: 22.4 pg — ABNORMAL LOW (ref 26.0–34.0)
MCHC: 31.9 g/dL — AB (ref 32.0–36.0)
MCV: 70.1 fL — AB (ref 80.0–100.0)
Platelets: 136 10*3/uL — ABNORMAL LOW (ref 150–440)
RBC: 4.67 MIL/uL (ref 4.40–5.90)
RDW: 19.2 % — AB (ref 11.5–14.5)
WBC: 16.5 10*3/uL — ABNORMAL HIGH (ref 3.8–10.6)

## 2015-05-14 LAB — TRIGLYCERIDES, BODY FLUIDS: Triglycerides, Fluid: 42 mg/dL

## 2015-05-14 LAB — PROTIME-INR
INR: 1.61
Prothrombin Time: 19.2 seconds — ABNORMAL HIGH (ref 11.4–15.0)

## 2015-05-14 MED ORDER — LACTULOSE 10 GM/15ML PO SOLN
30.0000 g | Freq: Two times a day (BID) | ORAL | Status: DC
  Administered 2015-05-14 – 2015-05-17 (×6): 30 g via ORAL
  Filled 2015-05-14 (×6): qty 60

## 2015-05-14 MED ORDER — FUROSEMIDE 40 MG PO TABS
40.0000 mg | ORAL_TABLET | Freq: Every day | ORAL | Status: DC
  Administered 2015-05-14 – 2015-05-15 (×2): 40 mg via ORAL
  Filled 2015-05-14 (×2): qty 1

## 2015-05-14 NOTE — NC FL2 (Signed)
[place] MEDICAID FL2 LEVEL OF CARE SCREENING TOOL     IDENTIFICATION  Patient Name: [name] Birthdate: 03/17/1941 Sex: male Admission Date (Current Location): 05/11/2015  [place]   Facility and Address:  [facility], [address], [place], [place] [address]      Provider Number: [id number]  Attending Physician Name and Address:  Houston SirenVivek J Sainani, MD  Relative Name and Phone Number:       Current Level of Care: Hospital Recommended Level of Care: Skilled Nursing Facility Prior Approval Number:    Date Approved/Denied:   PASRR Number: [id number]  Discharge Plan: SNF    Current Diagnoses: Patient Active Problem List   Diagnosis Date Noted  . Hepatic cirrhosis (HCC)   . Ascites 05/11/2015  . Gastritis and gastroduodenitis   . Gastroesophageal reflux disease without esophagitis   . Chest pain 03/12/2015    Orientation RESPIRATION BLADDER Height & Weight    Self  Normal Continent   178 lbs.  BEHAVIORAL SYMPTOMS/MOOD NEUROLOGICAL BOWEL NUTRITION STATUS   (none)  (none) Continent Diet  AMBULATORY STATUS COMMUNICATION OF NEEDS Skin   Limited Assist  (family reports he is verbal) Normal                       Personal Care Assistance Level of Assistance  Bathing, Dressing Bathing Assistance: Limited assistance   Dressing Assistance: Limited assistance     Functional Limitations Info             SPECIAL CARE FACTORS FREQUENCY  PT (By licensed PT)                    Contractures Contractures Info: Not present    Additional Factors Info  Code Status, Allergies Code Status Info: Full Allergies Info: NKA           Current Medications (05/14/2015):  This is the current hospital active medication list Current Facility-Administered Medications  Medication Dose Route Frequency Provider Last Rate Last Dose  . acetaminophen (TYLENOL) tablet 650 mg  650 mg Oral Q6H PRN Adrian SaranSital Mody, MD    650 mg at 05/14/15 1026   Or  . acetaminophen (TYLENOL) suppository 650 mg  650 mg Rectal Q6H PRN Adrian SaranSital Mody, MD      . alum & mag hydroxide-simeth (MAALOX/MYLANTA) 200-200-20 MG/5ML suspension 15 mL  15 mL Oral Q6H PRN Adrian SaranSital Mody, MD      . cefTRIAXone (ROCEPHIN) 1 g in dextrose 5 % 50 mL IVPB  1 g Intravenous Q24H Sital Mody, MD 100 mL/hr at 05/13/15 1337 1 g at 05/13/15 1337  . feeding supplement (ENSURE ENLIVE) (ENSURE ENLIVE) liquid 237 mL  237 mL Oral BID BM Harmeet Singh, MD   237 mL at 05/14/15 1051  . HYDROcodone-acetaminophen (NORCO/VICODIN) 5-325 MG per tablet 1-2 tablet  1-2 tablet Oral Q4H PRN Adrian SaranSital Mody, MD      . isosorbide dinitrate (ISORDIL) tablet 20 mg  20 mg Oral Daily Adrian SaranSital Mody, MD   20 mg at 05/14/15 0957  . lactulose (CHRONULAC) 10 GM/15ML solution 30 g  30 g Oral BID Houston SirenVivek J Sainani, MD      . ondansetron [facility](ZOFRAN) tablet 4 mg  4 mg Oral Q6H PRN Adrian SaranSital Mody, MD       Or  . ondansetron (ZOFRAN) injection 4 mg  4 mg Intravenous Q6H PRN Adrian SaranSital Mody, MD      . pantoprazole (PROTONIX) EC tablet 40  mg  40 mg Oral BID AC Bluford Kaufmann, PA-C   40 mg at 05/14/15 0865  . pregabalin (LYRICA) capsule 50 mg  50 mg Oral QHS Mosetta Pigeon, MD   50 mg at 05/13/15 2145  . rosuvastatin (CRESTOR) tablet 10 mg  10 mg Oral Evette Cristal, MD   10 mg at 05/14/15 [phone]  . senna-docusate (Senokot-S) tablet 1 tablet  1 tablet Oral QHS PRN Adrian Saran, MD      . spironolactone (ALDACTONE) tablet 25 mg  25 mg Oral Daily Bluford Kaufmann, PA-C   25 mg at 05/14/15 9629     Discharge Medications: Please see discharge summary for a list of discharge medications.  Relevant Imaging Results:  Relevant Lab Results:   Additional Information SS: [id number]  York Spaniel, LCSW

## 2015-05-14 NOTE — Progress Notes (Signed)
Subjective: This patient is well-known to me from a recent office visit for heartburn and a recent upper endoscopy. The patient was then admitted with increased abdominal girth and was found to have findings suggestive of cirrhosis with splenomegaly and a shrunken liver. The patient was also noted to have ascites. The patient has a history of dementia and has no history of any alcohol abuse. He does take amiodarone and has been since having cardiac issues back in 2013.   Objective: Vital signs in last 24 hours: Filed Vitals:   05/13/15 1123 05/13/15 1610 05/13/15 2232 05/14/15 0623  BP: 112/66  105/59 101/65  Pulse: 74  84 81  Temp: 98 F (36.7 C)  98.6 F (37 C) 98.2 F (36.8 C)  TempSrc: Oral  Oral Oral  Resp: Height:      Weight:      SpO2: 98% 96% 93% 96%   Weight change:   Intake/Output Summary (Last 24 hours) at 05/14/15 1121 Last data filed at 05/14/15 0900  Gross per 24 hour  Intake    600 ml  Output    250 ml  Net    350 ml     Exam: Heart:: Regular rate and rhythm Lungs: normal Abdomen: Distended with positive shifting dullness and fluid wave.   Lab Results: @ Micro Results: No results found for this or any previous visit (from the past 240 hour(s)). Studies/Results: US Abdomen Complete  05/12/2015  CLINICAL DATA:  Elevated liver function studies EXAM: ABDOMEN ULTRASOUND COMPLETE COMPARISON:  Noncontrast abdominal and pelvic CT scan of May 11, 2015 FINDINGS: Gallbladder: The gallbladder is adequately distended. There are multiple echogenic mobile shadowing and non shadowing stones. There is mild gallbladder wall thickening to 4.1 mm. There is no positive sonographic Murphy's sign. There is ascites present. Common bile duct: Diameter: 3.2 mm Liver: The liver appears shrunken. There is no focal mass. The echotexture is relatively homogeneous. The surface contour of the liver is fairly smooth. There is a large amount of ascites surrounding  the liver. IVC: No abnormality visualized. Pancreas: Bowel gas obscures the pancreas. Spleen: The spleen is enlarged exhibiting a calculated volume of 131 cc. Right Kidney: Length: 8.4 cm. The renal cortical echotexture remains lower than that of the liver. There is mild diffuse cortical thinning. There is no hydronephrosis. Left Kidney: Length: 9.2 cm. The renal cortical echotexture is similar to that on the right. There is mild diffuse cortical thinning. There is no hydronephrosis. Abdominal aorta: Limited visualization of the mid and distal aorta and bifurcation due to bowel gas. The proximal aorta exhibits a diameter 2.1 cm. Other findings: There is a large amount of ascites throughout the abdomen. IMPRESSION: 1. Shrunken liver with large amount of ascites and marked splenomegaly. The findings are suspicious for cirrhosis. No hepatic mass is observed. 2. Gallstones without sonographic evidence of acute cholecystitis. 3. Mild renal cortical thinning diffusely, bilaterally. There is no acute abnormality of the kidneys. 4. Limited visualization of the pancreas and abdominal aorta. Electronically Signed   By: David  [place] M.D.   On: 05/12/2015 12:36   US Paracentesis  05/13/2015  CLINICAL DATA:  74 year old male with a history of ascites. He has been referred for diagnostic and therapeutic ultrasound-guided paracentesis. EXAM: ULTRASOUND GUIDED  PARACENTESIS COMPARISON:  CT 05/11/2015 PROCEDURE: An ultrasound guided paracentesis was thoroughly discussed with the patient and questions answered. The benefits, risks, alternatives and complications were also discussed. The patient understands and wishes to proceed with  the procedure. Written consent was obtained. Ultrasound was performed to localize and mark an adequate pocket of fluid in the right lower quadrant of the abdomen. The area was then prepped and draped in the normal sterile fashion. 1% Lidocaine was used for local anesthesia. Under ultrasound guidance  a Safe-T-Centesis catheter was introduced. Paracentesis was performed. The catheter was removed and a dressing applied. COMPLICATIONS: None. FINDINGS: A total of approximately 5.5 L of thin yellow fluid was removed. A fluid sample was sent for laboratory analysis. IMPRESSION: Status post ultrasound-guided paracentesis with 5.5 L of thin yellow fluid removed. Sample was sent for analysis. Signed, Yvone NeuJaime S. Loreta AveWagner, DO [facility] Electronically Signed   By: Gilmer MorJaime  Wagner D.O.   On: 05/13/2015 11:11   Medications: I have reviewed the patient's current medications. Scheduled Meds: . cefTRIAXone (ROCEPHIN)  IV  1 g Intravenous Q24H  . feeding supplement (ENSURE ENLIVE)  237 mL Oral BID BM  . isosorbide dinitrate  20 mg Oral Daily  . lactulose  30 g Oral BID  . pantoprazole  40 mg Oral BID AC  . pregabalin  50 mg Oral QHS  . rosuvastatin  10 mg Oral BH-q7a  . spironolactone  25 mg Oral Daily   Continuous Infusions:  PRN Meds:.acetaminophen **OR** acetaminophen, alum & mag hydroxide-simeth, HYDROcodone-acetaminophen, ondansetron **OR** ondansetron (ZOFRAN) IV, senna-docusate   Assessment: Active Problems:   Ascites    Plan: This patient has ascites with signs of cirrhosis and portal hypertension. The patient's risk factors include medication since he has been on amiodarone. Idiopathic cirrhosis or fatty liver disease may also be another cause. The patient's daughter is concerned about [facility] and would rather continue following up with me. I have told her that that would be fine and I can take care of his liver disease. As far as his ascites the patient cannot have his diuretics increased with his creatinine at 1.89 today. The patient has also not had any out movements on lactulose and it has been increased today. The patient should have his lactulose increased incrementally until he is having 2  formed bowel movements a day.   LOS: 3 days   Daren Servando SnareWohl 05/14/2015, 11:21 AM

## 2015-05-14 NOTE — Progress Notes (Addendum)
Pt had a large bowel movement this afternoon. Pt states that having the bowel movement made he feel a lot better.  [name]

## 2015-05-14 NOTE — Progress Notes (Signed)
[facility] [place] [facility]   PATIENT NAME: Matthew LeydenJoe Blanchard    MR#:  [id number]  DATE OF BIRTH:  12/21/1940  SUBJECTIVE:   Patient here due to shortness of breath and increased abdominal girth. S/p Large volume paracentesis and 5 L of fluid removed.  Feels better and family at bedside.  NO BM despite getting Lactulose.  PT recommending STR.   REVIEW OF SYSTEMS:    Review of Systems  Unable to perform ROS: mental acuity    Nutrition: Regular Tolerating Diet: Yes Tolerating PT:  Await Eval.    DRUG ALLERGIES:  No Known Allergies  VITALS:  Blood pressure 88/49, pulse 89, temperature 98.3 F (36.8 C), temperature source Oral, resp. rate 17, height 5\' 6"  (1.676 m), weight 80.74 kg (178 lb), SpO2 97 %.  PHYSICAL EXAMINATION:   Physical Exam  GENERAL:  74 y.o.-year-old unkempt patient lying in the bed with no acute distress.  EYES: Pupils equal, round, reactive to light and accommodation. No scleral icterus. Extraocular muscles intact.  HEENT: Head atraumatic, normocephalic. Oropharynx and nasopharynx clear.  NECK:  Supple, no jugular venous distention. No thyroid enlargement, no tenderness.  LUNGS: Good A/E b/l, no wheezing, rales, rhonchi. No use of accessory muscles of respiration.  CARDIOVASCULAR: S1, S2 normal. No murmurs, rubs, or gallops.  ABDOMEN: Soft, protuberant/distended. Positive fluid wave or ascites. Bowel sounds present. No organomegaly or mass.  EXTREMITIES: No cyanosis, clubbing, +1 edema b/l.    NEUROLOGIC: Cranial nerves II through XII are intact. No focal Motor or sensory deficits b/l.  Globally weak PSYCHIATRIC: The patient is alert and oriented x 1.  Good affect.  SKIN: No obvious rash, lesion, or ulcer.    LABORATORY PANEL:   CBC  Recent Labs Lab 05/14/15 0559  WBC 16.5*  HGB 10.5*  HCT 32.7*  PLT 136*    ------------------------------------------------------------------------------------------------------------------  Chemistries   Recent Labs Lab 05/14/15 0559  NA 134*  K 4.4  CL 104  CO2 23  GLUCOSE 126*  BUN 21*  CREATININE 1.89*  CALCIUM 7.6*  AST 62*  ALT 31  ALKPHOS 434*  BILITOT 1.1   ------------------------------------------------------------------------------------------------------------------  Cardiac Enzymes  Recent Labs Lab 05/12/15 0514  TROPONINI 0.09*   ------------------------------------------------------------------------------------------------------------------  RADIOLOGY:  [place] Paracentesis  05/13/2015  CLINICAL DATA:  74 year old male with a history of ascites. He has been referred for diagnostic and therapeutic ultrasound-guided paracentesis. EXAM: ULTRASOUND GUIDED  PARACENTESIS COMPARISON:  CT 05/11/2015 PROCEDURE: An ultrasound guided paracentesis was thoroughly discussed with the patient and questions answered. The benefits, risks, alternatives and complications were also discussed. The patient understands and wishes to proceed with the procedure. Written consent was obtained. Ultrasound was performed to localize and mark an adequate pocket of fluid in the right lower quadrant of the abdomen. The area was then prepped and draped in the normal sterile fashion. 1% Lidocaine was used for local anesthesia. Under ultrasound guidance a Safe-T-Centesis catheter was introduced. Paracentesis was performed. The catheter was removed and a dressing applied. COMPLICATIONS: None. FINDINGS: A total of approximately 5.5 L of thin yellow fluid was removed. A fluid sample was sent for laboratory analysis. IMPRESSION: Status post ultrasound-guided paracentesis with 5.5 L of thin yellow fluid removed. Sample was sent for analysis. Signed, Yvone NeuJaime S. Loreta AveWagner, DO [facility] Electronically Signed   By: Gilmer MorJaime  Wagner  D.O.   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  #1 ascites-this is secondary to liver cirrhosis is seen on the CT of the abdomen and pelvis. -Status post large volume paracentesis and 5 L of fluid removed. -Continue Aldactone, Lasix. Patient SAAG < 1.  -Patient likely may need repeat paracentesis in the next 1-2 days.  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   - Patient has no history of alcohol abuse. Hepatitis serologies are negative so far. HIV test is negative. ANA and anti-mitochondrial ab is (-) too.  -?? Need for Liver biopsy but will follow up GI as outpatint.  - cont. Lactulose, Lasix, Aldactone, Protonix for now.   #3 coagulopathy-likely a combination of use of Coumadin and also underlying liver disease. -INR improved w/ FFP and vit K.  No acute bleeding and will monitor.   #4 history of chronic atrial fibrillation-hold amiodarone due to # 2.  -Rate controlled. Hold Coumadin given the coagulopathy.  #5 hyperlipidemia-continue Crestor.  #6 history of CHF-continue Aldactone, Imdur, Lasix.  - Patient clinically is not in CHF presently.  #7 acute renal failure - acute on chronic renal failure.  Baseline Cr. 1.45.   - likely related to volume shifts from ascites & cirrhosis and will monitor.  - hold IV fluids and cont. Diuretics as tolerated and will monitor.  - will need outpatient Nephro referral upon discharge.   PT recommended short-term rehabilitation and social work made aware.   All the records are reviewed and case discussed with Care Management/Social  Workerr. Management plans discussed with the patient, family and they are in agreement.  CODE STATUS: Full   DVT Prophylaxis: Teds and SCDs  TOTAL TIME TAKING CARE OF THIS PATIENT: 30 minutes.   POSSIBLE D/C IN 1-2 DAYS, DEPENDING ON CLINICAL CONDITION.   Houston Siren M.D on 05/14/2015 at 1:15 PM  Between 7am to 6pm - Pager - [phone]  After 6pm go to [email or web address] - password [email or web address] [facility]  [place] [facility]  Office  [phone]  CC: Primary care physician; Corky Downs, MD

## 2015-05-14 NOTE — Clinical Social Work Note (Signed)
Clinical Social Work Assessment  Patient Details  Name: [name] MRN: [id number] Date of Birth: 01-09-1941  Date of referral:  05/14/15               Reason for consult:  Facility Placement                Permission sought to share information with:   (Patient did not answer any questions but would smile and wave at [place] upon CSW entering room) Permission granted to share information::     Name::        Agency::     Relationship::     Contact Information:     Housing/Transportation Living arrangements for the past 2 months:  Single Family Home Source of Information:  Other (Comment Required) (grandchild: [name]: [phone]) Patient Interpreter Needed:  None Criminal Activity/Legal Involvement Pertinent to Current Situation/Hospitalization:  No - Comment as needed Significant Relationships:  Other Family Members Lives with:  Self Do you feel safe going back to the place where you live?    Need for family participation in patient care:     Care giving concerns:  Patient lives alone at home.   Social Worker assessment / plan:  CSW informed by Rod Holler with PT today that her recommendation is for STR for patient. MD spoke with patient's grandaughter: [name], and she is wanting to pursue this. CSW met with patient and patient's granddaughter this morning and patient was smiling but would not verbalize any form of communication. CSW informed by granddaughter that patient has been to [facility] in the past and would agree for patient to go there again but would prefer [facility] due to location. CSW explained bedsearch process and bedsearch initiated.   Employment status:  Retired Forensic scientist:  Medicare PT Recommendations:  [place] / Referral to community resources:  [place]  Patient/Family's Response to care:  Patient's granddaughter expressed appreciation for CSW assistance.  Patient/Family's Understanding of and  Emotional Response to Diagnosis, Current Treatment, and Prognosis:  Patient's granddaughter very involved in patient's care and is wanting patient to rehab enough to be able to return home.  Emotional Assessment Appearance:  Appears stated age Attitude/Demeanor/Rapport:   (pleasant and quiet) Affect (typically observed):  Calm Orientation:  Oriented to Self Alcohol / Substance use:  Not Applicable Psych involvement (Current and /or in the community):  No (Comment)  Discharge Needs  Concerns to be addressed:  Care Coordination Readmission within the last 30 days:  No Current discharge risk:  None Barriers to Discharge:  No Barriers Identified   Shela Leff, LCSW 05/14/2015, 11:42 AM

## 2015-05-15 LAB — COMPREHENSIVE METABOLIC PANEL
ALT: 31 U/L (ref 17–63)
AST: 60 U/L — AB (ref 15–41)
Albumin: 2.2 g/dL — ABNORMAL LOW (ref 3.5–5.0)
Alkaline Phosphatase: 450 U/L — ABNORMAL HIGH (ref 38–126)
Anion gap: 9 (ref 5–15)
BILIRUBIN TOTAL: 1.3 mg/dL — AB (ref 0.3–1.2)
BUN: 24 mg/dL — AB (ref 6–20)
CALCIUM: 7.7 mg/dL — AB (ref 8.9–10.3)
CO2: 25 mmol/L (ref 22–32)
CREATININE: 2 mg/dL — AB (ref 0.61–1.24)
Chloride: 102 mmol/L (ref 101–111)
GFR, EST AFRICAN AMERICAN: 36 mL/min — AB (ref >60)
GFR, EST NON AFRICAN AMERICAN: 31 mL/min — AB (ref >60)
Glucose, Bld: 136 mg/dL — ABNORMAL HIGH (ref 65–99)
Potassium: 4.2 mmol/L (ref 3.5–5.1)
Sodium: 136 mmol/L (ref 135–145)
TOTAL PROTEIN: 5 g/dL — AB (ref 6.5–8.1)

## 2015-05-15 LAB — CBC
HEMATOCRIT: 34.4 % — AB (ref 40.0–52.0)
Hemoglobin: 11 g/dL — ABNORMAL LOW (ref 13.0–18.0)
MCH: 22.5 pg — AB (ref 26.0–34.0)
MCHC: 31.9 g/dL — ABNORMAL LOW (ref 32.0–36.0)
MCV: 70.4 fL — AB (ref 80.0–100.0)
PLATELETS: 140 10*3/uL — AB (ref 150–440)
RBC: 4.89 MIL/uL (ref 4.40–5.90)
RDW: 19.4 % — AB (ref 11.5–14.5)
WBC: 16.4 10*3/uL — AB (ref 3.8–10.6)

## 2015-05-15 MED ORDER — MIDODRINE HCL 5 MG PO TABS
5.0000 mg | ORAL_TABLET | Freq: Three times a day (TID) | ORAL | Status: DC
  Administered 2015-05-15 – 2015-05-17 (×7): 5 mg via ORAL
  Filled 2015-05-15 (×7): qty 1

## 2015-05-15 MED ORDER — FUROSEMIDE 20 MG PO TABS
20.0000 mg | ORAL_TABLET | Freq: Every day | ORAL | Status: DC
  Administered 2015-05-16: 20 mg via ORAL
  Filled 2015-05-15 (×2): qty 1

## 2015-05-15 NOTE — Progress Notes (Signed)
[facility] [place] [facility]   PATIENT NAME: Matthew LeydenJoe Blanchard    MR#:  [id number]  DATE OF BIRTH:  10/16/1940  SUBJECTIVE:   Patient here due to shortness of breath and increased abdominal girth. S/p Large volume paracentesis and 5 L of fluid removed.  Had a BM yesterday.  Seen by GI and going for Doppler study to r/o Portal Vein Thrombosis.  Cr. A bit worse today and started on Midodrine  REVIEW OF SYSTEMS:    Review of Systems  Unable to perform ROS: mental acuity    Nutrition: Regular Tolerating Diet: Yes Tolerating PT: Eval Noted.    DRUG ALLERGIES:  No Known Allergies  VITALS:  Blood pressure 104/62, pulse 81, temperature 98 F (36.7 C), temperature source Oral, resp. rate 16, height 5\' 6"  (1.676 m), weight 80.74 kg (178 lb), SpO2 98 %.  PHYSICAL EXAMINATION:   Physical Exam  GENERAL:  74 y.o.-year-old unkempt patient lying in the bed with no acute distress.  EYES: Pupils equal, round, reactive to light and accommodation. No scleral icterus. Extraocular muscles intact.  HEENT: Head atraumatic, normocephalic. Oropharynx and nasopharynx clear.  NECK:  Supple, no jugular venous distention. No thyroid enlargement, no tenderness.  LUNGS: Good A/E b/l, no wheezing, rales, rhonchi. No use of accessory muscles of respiration.  CARDIOVASCULAR: S1, S2 normal. No murmurs, rubs, or gallops.  ABDOMEN: Soft, protuberant/distended. Positive fluid wave or ascites. Bowel sounds present. No organomegaly or mass.  EXTREMITIES: No cyanosis, clubbing, +1 edema b/l.    NEUROLOGIC: Cranial nerves II through XII are intact. No focal Motor or sensory deficits b/l.  Globally weak PSYCHIATRIC: The patient is alert and oriented x 1.  Good affect.  SKIN: No obvious rash, lesion, or ulcer.    LABORATORY PANEL:   CBC  Recent Labs Lab 05/15/15 0545  WBC 16.4*  HGB 11.0*  HCT 34.4*  PLT 140*    ------------------------------------------------------------------------------------------------------------------  Chemistries   Recent Labs Lab 05/15/15 0545  NA 136  K 4.2  CL 102  CO2 25  GLUCOSE 136*  BUN 24*  CREATININE 2.00*  CALCIUM 7.7*  AST 60*  ALT 31  ALKPHOS 450*  BILITOT 1.3*   ------------------------------------------------------------------------------------------------------------------  Cardiac Enzymes  Recent Labs Lab 05/12/15 0514  TROPONINI 0.09*   ------------------------------------------------------------------------------------------------------------------  RADIOLOGY:  No results found.   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  #1 ascites-this is secondary to liver cirrhosis is seen on the CT of the abdomen and pelvis. -Status post large volume paracentesis and 5 L of fluid removed. -Continue Aldactone, Lasix. Patient SAAG < 1.  -Patient likely may need repeat paracentesis in the next 1-2 days prior to discharge to SNF.   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  Going for Doppler US to r/o Portal Vein thrombosis.  - Patient has no history of alcohol abuse. Hepatitis serologies are negative so far. HIV test is negative. ANA and anti-mitochondrial ab is (-) too.  -?? Need for Liver biopsy but will follow up GI as outpatint.  - cont. Lactulose, Lasix, Aldactone, Protonix for now.   #3 coagulopathy-likely a combination of use of Coumadin and also underlying liver disease. -INR improved w/ FFP and  vit K.  No acute bleeding.    #4 history of chronic atrial  fibrillation-hold amiodarone due to # 2.  -Rate controlled. Hold Coumadin given the coagulopathy.  #5 hyperlipidemia-continue Crestor.  #6 history of CHF-continue Aldactone, Imdur, Lasix.  - Patient clinically is not in CHF presently.  #7 acute renal failure - acute on chronic renal failure.  Baseline Cr. 1.45.   - likely related to volume shifts from ascites & cirrhosis and will monitor.  - cont. Diuretics and started on Midodrine by Nephro and will monitor.    PT recommended short-term rehabilitation and social work doing bed search. Likely d/c in next 1-2 days.   All the records are reviewed and case discussed with Care Management/Social Workerr. Management plans discussed with the patient, family and they are in agreement.  CODE STATUS: Full   DVT Prophylaxis: Teds and SCDs  TOTAL TIME TAKING CARE OF THIS PATIENT: 30 minutes.   POSSIBLE D/C IN 1-2 DAYS, DEPENDING ON CLINICAL CONDITION.   [place] Siren M.D on 05/15/2015 at 1:33 PM  Between 7am to 6pm - Pager - [phone]  After 6pm go to [email or web address] - password [email or web address] [facility]  [place] [facility]  Office  [phone]  CC: Primary care physician; Corky Downs, MD

## 2015-05-15 NOTE — Progress Notes (Signed)
Subjective: The patient without any new complaints. The Cr. Hhas increased a bit since yesterday. The patient has signs of cirrhosis with ascites and splenomegaly with signs of portal hypertension.   Objective: Vital signs in last 24 hours: Filed Vitals:   05/14/15 1259 05/14/15 1323 05/14/15 2128 05/15/15 0555  BP: 88/49 98/62 111/60 104/62  Pulse: 89  81 81  Temp: 98.3 F (36.8 C)  98.4 F (36.9 C) 98 F (36.7 C)  TempSrc: Oral  Oral Oral  Resp: _0 Height:      Weight:      SpO2: 97%  98% 98%   Weight change:   Intake/Output Summary (Last 24 hours) at 05/15/15 1056 Last data filed at 05/14/15 1957  Gross per 24 hour  Intake    200 ml  Output    650 ml  Net   -450 ml     Exam: Heart:: Regular rate and rhythm Lungs: clear to auscultation Abdomen: distended with ascited and fluid wave.   Lab Results: _1 @ Micro Results: Recent Results (from the past 240 hour(s))  Body fluid culture     Status: None (Preliminary result)   Collection Time: 05/13/15 10:00 AM  Result Value Ref Range Status   Specimen Description PERITONEAL  Final   Special Requests NONE  Final   Gram Stain RARE WBC SEEN NO ORGANISMS SEEN   Final   Culture NO GROWTH 2 DAYS  Final   Report Status PENDING  Incomplete   Studies/Results: No results found. Medications: I have reviewed the patient's current medications. Scheduled Meds: . cefTRIAXone (ROCEPHIN)  IV  1 g Intravenous Q24H  . feeding supplement (ENSURE ENLIVE)  237 mL Oral BID BM  . [START ON 05/16/2015] furosemide  20 mg Oral Daily  . isosorbide dinitrate  20 mg Oral Daily  . lactulose  30 g Oral BID  . pantoprazole  40 mg Oral BID AC  . pregabalin  50 mg Oral QHS  . rosuvastatin  10 mg Oral BH-q7a  . spironolactone  25 mg Oral Daily   Continuous Infusions:  PRN Meds:.acetaminophen **OR** acetaminophen, alum & mag hydroxide-simeth, HYDROcodone-acetaminophen, ondansetron **OR** ondansetron (ZOFRAN) IV,  senna-docusate   Assessment: Active Problems:   Ascites   Hepatic cirrhosis (HCC)    Plan: 1) Cirrhosis - Of note, no varices, no thrombocytopenia and a smooth liver on U/S. Alk phos increased but normal AMA.  May be fatty liver vs. Drug induced.   Will get doppler U/S to evaluate for portal vien thrombosis. 2) Cr. Increased - will decrease lasix to 50m.  3) will likely need a liver biopsy in the future.   LOS: 4 days   Daren WAllen Norris12/25/2016, 10:56 AM

## 2015-05-15 NOTE — Progress Notes (Signed)
Subjective:  Patient underwent paracentesis  12/23 5.5 L were removed Serum creatinine has slightly worsened today to 2.0   Objective:  Vital signs in last 24 hours:  Temp:  [98 F (36.7 C)-98.4 F (36.9 C)] 98 F (36.7 C) (12/25 0555) Pulse Rate:  [81-89] 81 (12/25 0555) Resp:  [16-20] 16 (12/25 0555) BP: (88-111)/(49-62) 104/62 mmHg (12/25 0555) SpO2:  [97 %-98 %] 98 % (12/25 0555)  Weight change:  Filed Weights   05/11/15 0901  Weight: 80.74 kg (178 lb)    Intake/Output:    Intake/Output Summary (Last 24 hours) at 05/15/15 1132 Last data filed at 05/14/15 1957  Gross per 24 hour  Intake    100 ml  Output    500 ml  Net   -400 ml     Physical Exam: General:  no acute distress, laying in the bed   HEENT  anicteric , moist oral mucous membranes   Neck  supple   Pulm/lungs  normal respiratory effort, clear to auscultation   CVS/Heart  irregular rhythm, no rub or gallop   Abdomen:   soft, appears more distended today, ascites   Extremities:  + pitting edema   Neurologic:  alert, following commands   Skin:  no acute rashes   Access:        Basic Metabolic Panel:   Recent Labs Lab 05/11/15 0932 05/12/15 0514 05/13/15 0513 05/14/15 0559 05/15/15 0545  NA 132* 128* 134* 134* 136  K 4.7 4.2 4.4 4.4 4.2  CL 103 99* 101 104 102  CO2 23 24 23 23 25   GLUCOSE 187* 116* 139* 126* 136*  BUN 22* 20 22* 21* 24*  CREATININE 2.14* 1.95* 1.83* 1.89* 2.00*  CALCIUM 7.7* 7.2* 7.7* 7.6* 7.7*     CBC:  Recent Labs Lab 05/11/15 0932 05/12/15 0514 05/13/15 0513 05/14/15 0559 05/15/15 0545  WBC 20.8* 17.7* 18.0* 16.5* 16.4*  NEUTROABS 18.5*  --   --   --   --   HGB 11.4* 10.8* 10.4* 10.5* 11.0*  HCT 37.4* 34.8* 33.9* 32.7* 34.4*  MCV 72.9* 72.0* 71.0* 70.1* 70.4*  PLT 173 152 153 136* 140*      Microbiology:  Recent Results (from the past 720 hour(s))  Body fluid culture     Status: None (Preliminary result)   Collection Time: 05/13/15 10:00 AM   Result Value Ref Range Status   Specimen Description PERITONEAL  Final   Special Requests NONE  Final   Gram Stain RARE WBC SEEN NO ORGANISMS SEEN   Final   Culture NO GROWTH 2 DAYS  Final   Report Status PENDING  Incomplete    Coagulation Studies:  Recent Labs  05/12/15 1501 05/13/15 0513 05/14/15 0559  LABPROT 32.7* 23.9* 19.2*  INR 3.28 2.16 1.61    Urinalysis: No results for input(s): COLORURINE, LABSPEC, PHURINE, GLUCOSEU, HGBUR, BILIRUBINUR, KETONESUR, PROTEINUR, UROBILINOGEN, NITRITE, LEUKOCYTESUR in the last 72 hours.  Invalid input(s): APPERANCEUR    Imaging: No results found.   Medications:     . cefTRIAXone (ROCEPHIN)  IV  1 g Intravenous Q24H  . feeding supplement (ENSURE ENLIVE)  237 mL Oral BID BM  . [START ON 05/16/2015] furosemide  20 mg Oral Daily  . isosorbide dinitrate  20 mg Oral Daily  . lactulose  30 g Oral BID  . pantoprazole  40 mg Oral BID AC  . pregabalin  50 mg Oral QHS  . rosuvastatin  10 mg Oral BH-q7a  . spironolactone  25  mg Oral Daily   acetaminophen **OR** acetaminophen, alum & mag hydroxide-simeth, HYDROcodone-acetaminophen, ondansetron **OR** ondansetron (ZOFRAN) IV, senna-docusate  Assessment/ Plan:  74 y.o. male with a PMHX of dementia,atrial fibrillation, hypertension, GERD, newly discovered cirrhosis and ascites, cholelithiasis, aortic valve replacement, CABG, , was admitted on 05/11/2015 with ascites, worsening lower extremity edema.  1. Acute on chronic kidney disease stage III Baseline creatinine 1.45/GFR 46 Admission creatinine 2.19 today's creatinine 2.0 Acute renal failure is likely secondary to volume shifts related to ascites and cirrhosis - continue to monitor renal function and electrolytes - Electrolytes are acceptable, No acute indication for Dialysis at present  - add midodrine  2. Cirrhosis 5.5 L removed via paracentesis on December 23  3. Hyponatremia Likely secondary to cirrhosis Continue salt  and fluid restriction    LOS: 4 Julita Ozbun 12/25/201611:32 AM

## 2015-05-16 ENCOUNTER — Inpatient Hospital Stay: Payer: Medicare Other

## 2015-05-16 LAB — BASIC METABOLIC PANEL
Anion gap: 10 (ref 5–15)
BUN: 26 mg/dL — ABNORMAL HIGH (ref 6–20)
CALCIUM: 8 mg/dL — AB (ref 8.9–10.3)
CO2: 25 mmol/L (ref 22–32)
CREATININE: 2.28 mg/dL — AB (ref 0.61–1.24)
Chloride: 104 mmol/L (ref 101–111)
GFR calc non Af Amer: 27 mL/min — ABNORMAL LOW (ref >60)
GFR, EST AFRICAN AMERICAN: 31 mL/min — AB (ref >60)
Glucose, Bld: 140 mg/dL — ABNORMAL HIGH (ref 65–99)
Potassium: 4.4 mmol/L (ref 3.5–5.1)
Sodium: 139 mmol/L (ref 135–145)

## 2015-05-16 MED ORDER — ALBUMIN HUMAN 25 % IV SOLN
12.5000 g | Freq: Once | INTRAVENOUS | Status: AC
  Administered 2015-05-16: 12.5 g via INTRAVENOUS
  Filled 2015-05-16: qty 50

## 2015-05-16 NOTE — Progress Notes (Signed)
Subjective: Patient without complaints today. The patient has worsening creatinine. The patient's Lasix was decreased yesterday but the creatinine continues to rise. The patient is now being followed by nephrology. The patient is alert today and does not appear to be far from baseline.   Objective: Vital signs in last 24 hours: Filed Vitals:   05/15/15 0555 05/15/15 1450 05/15/15 2037 05/16/15 0436  BP: 104/62 95/60 109/56 103/58  Pulse: 81 88 79 78  Temp: 98 F (36.7 C) 98.4 F (36.9 C) 98 F (36.7 C) 98.1 F (36.7 C)  TempSrc: Oral Oral Oral Oral  Resp: 16 18 16    Height:      Weight:      SpO2: 98% 98% 96% 98%   Weight change:   Intake/Output Summary (Last 24 hours) at 05/16/15 [id number] Last data filed at 05/16/15 [id number]  Gross per 24 hour  Intake    360 ml  Output    255 ml  Net    105 ml     Exam: Heart:: S1S2 present Lungs: clear to auscultation Abdomen: Consistent with ascites. Softer than it was yesterday.   Lab Results: @LABTEST2 @ Micro Results: Recent Results (from the past 240 hour(s))  Body fluid culture     Status: None (Preliminary result)   Collection Time: 05/13/15 10:00 AM  Result Value Ref Range Status   Specimen Description PERITONEAL  Final   Special Requests NONE  Final   Gram Stain RARE WBC SEEN NO ORGANISMS SEEN   Final   Culture NO GROWTH 2 DAYS  Final   Report Status PENDING  Incomplete   Studies/Results: No results found. Medications: I have reviewed the patient's current medications. Scheduled Meds: . albumin human  12.5 g Intravenous Once  . cefTRIAXone (ROCEPHIN)  IV  1 g Intravenous Q24H  . feeding supplement (ENSURE ENLIVE)  237 mL Oral BID BM  . furosemide  20 mg Oral Daily  . isosorbide dinitrate  20 mg Oral Daily  . lactulose  30 g Oral BID  . midodrine  5 mg Oral TID WC  . pantoprazole  40 mg Oral BID AC  . pregabalin  50 mg Oral QHS  . rosuvastatin  10 mg Oral BH-q7a  . spironolactone  25 mg Oral Daily   Continuous  Infusions:  PRN Meds:.acetaminophen **OR** acetaminophen, alum & mag hydroxide-simeth, HYDROcodone-acetaminophen, ondansetron **OR** ondansetron (ZOFRAN) IV, senna-docusate   Assessment: Active Problems:   Ascites   Hepatic cirrhosis (HCC)    Plan: This patient has what appears to be ascites with cirrhosis with the etiology unknown. The patient continues to have increasing creatinine and is followed by nephrology. I would continue treating the patient symptomatically and do paracentesis as needed.   LOS: 5 days   [name] 05/16/2015, 9:03 AM

## 2015-05-16 NOTE — Care Management Important Message (Signed)
Important Message  Patient Details  Name: Collene LeydenJoe Tallerico MRN: [id number] Date of Birth: 01/01/1941   Medicare Important Message Given:  Yes    Kalynne Womac A, RN 05/16/2015, 10:05 AM

## 2015-05-16 NOTE — Clinical Social Work Note (Signed)
Initially patient had bed offers but not from facilities of choice by granddaughter. [facility] told [facility] that patient was on haldol which was a "behavior medication." CSW investigated this further and discovered that patient was not on haldol and [facility]W [facility] and after much investigation it was determined that the DON had pulled the wrong patient information and was basing their decision on another patient's information. After further review, [facility] was able to come back and state that they will take patient at discharge. Patient's granddaughter is aware. York SpanielMonica Kecia Swoboda MSW,LCSW [phone]

## 2015-05-16 NOTE — Progress Notes (Signed)
[facility] - [place] at [facility]   PATIENT NAME: Matthew LeydenJoe Doty    MR#:  [id number]  DATE OF BIRTH:  12/07/1940  SUBJECTIVE:   No acute complaints presently. S/p Doppler US showing no evidence of Portal Vein thrombosis.  Renal function a bit worse today.   REVIEW OF SYSTEMS:    Review of Systems  Unable to perform ROS: mental acuity    Nutrition: Regular Tolerating Diet: Yes Tolerating PT: Eval Noted.    DRUG ALLERGIES:  No Known Allergies  VITALS:  Blood pressure 110/65, pulse 81, temperature 98.4 F (36.9 C), temperature source Oral, resp. rate 18, height 5\' 6"  (1.676 m), weight 80.74 kg (178 lb), SpO2 97 %.  PHYSICAL EXAMINATION:   Physical Exam  GENERAL:  74 y.o.-year-old unkempt patient lying in the bed with no acute distress.  EYES: Pupils equal, round, reactive to light and accommodation. No scleral icterus. Extraocular muscles intact.  HEENT: Head atraumatic, normocephalic. Oropharynx and nasopharynx clear.  NECK:  Supple, no jugular venous distention. No thyroid enlargement, no tenderness.  LUNGS: Good A/E b/l, no wheezing, rales, rhonchi. No use of accessory muscles of respiration.  CARDIOVASCULAR: S1, S2 normal. No murmurs, rubs, or gallops.  ABDOMEN: Soft, protuberant/distended. Positive fluid wave or ascites. Bowel sounds present. No organomegaly or mass.  EXTREMITIES: No cyanosis, clubbing, +1 edema b/l.    NEUROLOGIC: Cranial nerves II through XII are intact. No focal Motor or sensory deficits b/l.  Globally weak PSYCHIATRIC: The patient is alert and oriented x 1.  Good affect.  SKIN: No obvious rash, lesion, or ulcer.    LABORATORY PANEL:   CBC  Recent Labs Lab 05/15/15 0545  WBC 16.4*  HGB 11.0*  HCT 34.4*  PLT 140*   ------------------------------------------------------------------------------------------------------------------  Chemistries   Recent Labs Lab 05/15/15 0545 05/16/15 0553  NA 136 139  K 4.2 4.4   CL 102 104  CO2 25 25  GLUCOSE 136* 140*  BUN 24* 26*  CREATININE 2.00* 2.28*  CALCIUM 7.7* 8.0*  AST 60*  --   ALT 31  --   ALKPHOS 450*  --   BILITOT 1.3*  --    ------------------------------------------------------------------------------------------------------------------  Cardiac Enzymes  Recent Labs Lab 05/12/15 0514  TROPONINI 0.09*   ------------------------------------------------------------------------------------------------------------------  RADIOLOGY:  [place] Art/ven Flow Abd Pelv Doppler  05/16/2015  CLINICAL DATA:  Portal hypertension, ascites, no clinical history of cirrhosis. EXAM: DUPLEX ULTRASOUND OF LIVER TECHNIQUE: Color and duplex Doppler ultrasound was performed to evaluate the hepatic in-flow and out-flow vessels. COMPARISON:  Ultrasound 05/12/2015 and previous FINDINGS: Portal Vein: No occlusion or thrombus. Velocities (all hepatopetal): Main:  18-20 cm/sec Right:  25 cm/sec Left:  16 cm/sec Intrahepatic IVC 57 cm/sec Hepatic Vein Velocities (all hepatofugal): Right:  31 cm/sec Middle:  59 cm/sec Left:  38 cm/sec Hepatic Artery Velocity:  345 cm/sec Spleen 14.2 x 6.7 x 17.1 cm. Splenic Vein: Antegrade flow at hilum. Portosplenic confluence was not visualized due to overlying bowel gas. No occlusion or thrombus evident. Velocity: 16 cm/sec Varices: Not identified Ascites: Present IMPRESSION: 1. Normal hepatic vascular Doppler assessment. 2. Splenomegaly and abdominal ascites, as previously noted. Electronically Signed   By: Corlis Leak  Hassell M.D.   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  #1 ascites-this is secondary to liver cirrhosis is seen on the CT of the abdomen and  pelvis. -Status post large volume paracentesis 12/23 and 5 L of fluid removed. -Continue Aldactone, Lasix. Patient SAAG < 1.  - may need repeat Paracentesis prior to discharge.   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  Doppler of Hepatic vessels today showing no evidence of Portal vein thrombosis.   - Patient has no history of alcohol abuse. Hepatitis serologies are negative so far. HIV test is negative. ANA and anti-mitochondrial ab is (-) too.  -?? Need for Liver biopsy but will follow up GI as outpatint.  - cont. Lactulose, Lasix, Aldactone, Protonix for now.   #3 coagulopathy-likely a combination of use of Coumadin and also underlying liver disease. -INR improved w/ FFP and vit K.  No acute bleeding.   - pt. To stay off coumadin indefinitely given chronic liver disease.   #4 history of chronic atrial fibrillation-hold amiodarone due to # 2.  -Rate controlled. Hold Coumadin given the coagulopathy.  #5 hyperlipidemia-continue Crestor.  #6 history of CHF-continue Aldactone, Imdur, Lasix.  - Patient clinically is not in CHF presently.  #7 acute renal failure - acute on chronic renal failure.  Baseline Cr. 1.45 and today up to 2.2.  - likely related to volume shifts from ascites & cirrhosis and will monitor.  - cont. Albumin for oncotic support, Midodrine.  Cont. Further care as per Nephro.   PT recommended short-term rehabilitation and social work doing bed search. Likely d/c in next 1-2 days if renal function stable.   All the records are reviewed and case discussed with Care Management/Social Workerr. Management plans discussed with the patient, family and they are in agreement.  CODE STATUS: Full   DVT Prophylaxis: Teds and SCDs  TOTAL TIME TAKING CARE OF THIS PATIENT: 30 minutes.   POSSIBLE D/C IN 1-2 DAYS, DEPENDING ON CLINICAL CONDITION.   Houston Siren M.D  on 05/16/2015 at 2:51 PM  Between 7am to 6pm - Pager - [phone]  After 6pm go to [email or web address] - password [email or web address] [facility]  [facility]  Office  [phone]  CC: Primary care physician; Corky Downs, MD

## 2015-05-16 NOTE — Progress Notes (Signed)
Subjective:  Patient underwent paracentesis  12/23 5.5 L were removed Serum creatinine has slightly worsened today to 2.28 Patient denies any acute complaints   Objective:  Vital signs in last 24 hours:  Temp:  [98 F (36.7 C)-98.4 F (36.9 C)] 98.1 F (36.7 C) (12/26 0436) Pulse Rate:  [78-88] 78 (12/26 0436) Resp:  [16-18] 16 (12/25 2037) BP: (95-109)/(56-60) 103/58 mmHg (12/26 0436) SpO2:  [96 %-98 %] 98 % (12/26 0436)  Weight change:  Filed Weights   05/11/15 0901  Weight: 80.74 kg (178 lb)    Intake/Output:    Intake/Output Summary (Last 24 hours) at 05/16/15 1342 Last data filed at 05/16/15 1300  Gross per 24 hour  Intake    420 ml  Output    355 ml  Net     65 ml     Physical Exam: General:  no acute distress, laying in the bed   HEENT  anicteric , moist oral mucous membranes   Neck  supple   Pulm/lungs  normal respiratory effort, clear to auscultation   CVS/Heart  irregular rhythm, no rub or gallop   Abdomen:   soft, appears distended, ascites   Extremities:  + pitting edema   Neurologic:  alert, following commands   Skin:  no acute rashes   Access:        Basic Metabolic Panel:   Recent Labs Lab 05/12/15 0514 05/13/15 0513 05/14/15 0559 05/15/15 0545 05/16/15 0553  NA 128* 134* 134* 136 139  K 4.2 4.4 4.4 4.2 4.4  CL 99* 101 104 102 104  CO2 24 23 23 25 25   GLUCOSE 116* 139* 126* 136* 140*  BUN 20 22* 21* 24* 26*  CREATININE 1.95* 1.83* 1.89* 2.00* 2.28*  CALCIUM 7.2* 7.7* 7.6* 7.7* 8.0*     CBC:  Recent Labs Lab 05/11/15 0932 05/12/15 0514 05/13/15 0513 05/14/15 0559 05/15/15 0545  WBC 20.8* 17.7* 18.0* 16.5* 16.4*  NEUTROABS 18.5*  --   --   --   --   HGB 11.4* 10.8* 10.4* 10.5* 11.0*  HCT 37.4* 34.8* 33.9* 32.7* 34.4*  MCV 72.9* 72.0* 71.0* 70.1* 70.4*  PLT 173 152 153 136* 140*      Microbiology:  Recent Results (from the past 720 hour(s))  Body fluid culture     Status: None (Preliminary result)   Collection  Time: 05/13/15 10:00 AM  Result Value Ref Range Status   Specimen Description PERITONEAL  Final   Special Requests NONE  Final   Gram Stain RARE WBC SEEN NO ORGANISMS SEEN   Final   Culture NO GROWTH 3 DAYS  Final   Report Status PENDING  Incomplete    Coagulation Studies:  Recent Labs  05/14/15 0559  LABPROT 19.2*  INR 1.61    Urinalysis: No results for input(s): COLORURINE, LABSPEC, PHURINE, GLUCOSEU, HGBUR, BILIRUBINUR, KETONESUR, PROTEINUR, UROBILINOGEN, NITRITE, LEUKOCYTESUR in the last 72 hours.  Invalid input(s): APPERANCEUR    Imaging: [place] Art/ven Flow Abd Pelv Doppler  05/16/2015  CLINICAL DATA:  Portal hypertension, ascites, no clinical history of cirrhosis. EXAM: DUPLEX ULTRASOUND OF LIVER TECHNIQUE: Color and duplex Doppler ultrasound was performed to evaluate the hepatic in-flow and out-flow vessels. COMPARISON:  Ultrasound 05/12/2015 and previous FINDINGS: Portal Vein: No occlusion or thrombus. Velocities (all hepatopetal): Main:  18-20 cm/sec Right:  25 cm/sec Left:  16 cm/sec Intrahepatic IVC 57 cm/sec Hepatic Vein Velocities (all hepatofugal): Right:  31 cm/sec Middle:  59 cm/sec Left:  38 cm/sec Hepatic Artery Velocity:  345 cm/sec Spleen 14.2 x 6.7 x 17.1 cm. Splenic Vein: Antegrade flow at hilum. Portosplenic confluence was not visualized due to overlying bowel gas. No occlusion or thrombus evident. Velocity: 16 cm/sec Varices: Not identified Ascites: Present IMPRESSION: 1. Normal hepatic vascular Doppler assessment. 2. Splenomegaly and abdominal ascites, as previously noted. Electronically Signed   By: Corlis Leak M.D.   On: 05/16/2015 13:32     Medications:     . cefTRIAXone (ROCEPHIN)  IV  1 g Intravenous Q24H  . feeding supplement (ENSURE ENLIVE)  237 mL Oral BID BM  . furosemide  20 mg Oral Daily  . isosorbide dinitrate  20 mg Oral Daily  . lactulose  30 g Oral BID  . midodrine  5 mg Oral TID WC  . pantoprazole  40 mg Oral BID AC  . pregabalin  50 mg  Oral QHS  . rosuvastatin  10 mg Oral BH-q7a  . spironolactone  25 mg Oral Daily   acetaminophen **OR** acetaminophen, alum & mag hydroxide-simeth, HYDROcodone-acetaminophen, ondansetron **OR** ondansetron (ZOFRAN) IV, senna-docusate  Assessment/ Plan:  74 y.o. male with a PMHX of dementia,atrial fibrillation, hypertension, GERD, newly discovered cirrhosis and ascites, cholelithiasis, aortic valve replacement, CABG, , was admitted on 05/11/2015 with ascites, worsening lower extremity edema.  1. Acute on chronic kidney disease stage III Baseline creatinine 1.45/GFR 46 Admission creatinine 2.19 today's creatinine 2.28 Acute renal failure is likely secondary to volume shifts related to ascites and cirrhosis - continue to monitor renal function and electrolytes -  No acute indication for Dialysis at present  - add midodrine, IV albumin for oncotic support  2. Cirrhosis 5.5 L removed via paracentesis on December 23  3. Hyponatremia Likely secondary to cirrhosis Continue salt and fluid restriction    LOS: 5 Rashi Giuliani 12/26/20161:42 PM

## 2015-05-17 ENCOUNTER — Inpatient Hospital Stay: Payer: Medicare Other

## 2015-05-17 DIAGNOSIS — K766 Portal hypertension: Secondary | ICD-10-CM | POA: Insufficient documentation

## 2015-05-17 LAB — BASIC METABOLIC PANEL
Anion gap: 9 (ref 5–15)
BUN: 29 mg/dL — AB (ref 6–20)
CALCIUM: 7.9 mg/dL — AB (ref 8.9–10.3)
CO2: 22 mmol/L (ref 22–32)
CREATININE: 2.19 mg/dL — AB (ref 0.61–1.24)
Chloride: 107 mmol/L (ref 101–111)
GFR calc non Af Amer: 28 mL/min — ABNORMAL LOW (ref >60)
GFR, EST AFRICAN AMERICAN: 32 mL/min — AB (ref >60)
Glucose, Bld: 158 mg/dL — ABNORMAL HIGH (ref 65–99)
Potassium: 4 mmol/L (ref 3.5–5.1)
SODIUM: 138 mmol/L (ref 135–145)

## 2015-05-17 LAB — BODY FLUID CULTURE: CULTURE: NO GROWTH

## 2015-05-17 LAB — CYTOLOGY - NON PAP

## 2015-05-17 MED ORDER — ASPIRIN 81 MG PO CHEW: 324.0000 mg | CHEWABLE_TABLET | Freq: Every day | ORAL | Status: AC

## 2015-05-17 MED ORDER — ENSURE ENLIVE PO LIQD: 237.0000 mL | Freq: Two times a day (BID) | ORAL | Status: DC

## 2015-05-17 MED ORDER — MIDODRINE HCL 5 MG PO TABS: 5.0000 mg | ORAL_TABLET | Freq: Three times a day (TID) | ORAL | Status: DC

## 2015-05-17 MED ORDER — SPIRONOLACTONE 25 MG PO TABS: 25.0000 mg | ORAL_TABLET | Freq: Every day | ORAL | Status: DC

## 2015-05-17 MED ORDER — FUROSEMIDE 20 MG PO TABS: 20.0000 mg | ORAL_TABLET | Freq: Every day | ORAL | Status: DC

## 2015-05-17 MED ORDER — LACTULOSE 10 GM/15ML PO SOLN: 30.0000 g | Freq: Two times a day (BID) | ORAL | Status: AC

## 2015-05-17 NOTE — Procedures (Signed)
RLQ paracentesis 1.5 L No comp/EBL

## 2015-05-17 NOTE — Progress Notes (Signed)
Subjective: Patient appears much more alert and awake this morning. He is undergoing physical therapy at that time of our conversation. The patient's oral vein Doppler did not show any sign of portal vein thrombosis. The hospitalist note reported the SAAG to be less than 1 which in fact was in this present and it is greater than 1 is consistent with portal hypertension.   Objective: Vital signs in last 24 hours: Filed Vitals:   05/16/15 1342 05/16/15 2047 05/16/15 2051 05/17/15 0542  BP: 110/65 132/65 114/66 101/52  Pulse: 81 97 88 83  Temp: 98.4 F (36.9 C) 98.7 F (37.1 C) 97.9 F (36.6 C) 97.9 F (36.6 C)  TempSrc: Oral Oral Oral Oral  Resp: 18 20 20 20   Height:      Weight:      SpO2: 97% 90% 96% 96%   Weight change:   Intake/Output Summary (Last 24 hours) at 05/17/15 [id number] Last data filed at 05/17/15 0547  Gross per 24 hour  Intake    420 ml  Output    450 ml  Net    -30 ml     Exam: Heart:: S1S2 present Abdomen: Positive for ascites   Lab Results: @LABTEST2 @ Micro Results: Recent Results (from the past 240 hour(s))  Body fluid culture     Status: None (Preliminary result)   Collection Time: 05/13/15 10:00 AM  Result Value Ref Range Status   Specimen Description PERITONEAL  Final   Special Requests NONE  Final   Gram Stain RARE WBC SEEN NO ORGANISMS SEEN   Final   Culture NO GROWTH 3 DAYS  Final   Report Status PENDING  Incomplete   Studies/Results: [place] Art/ven Flow Abd Pelv Doppler  05/16/2015  CLINICAL DATA:  Portal hypertension, ascites, no clinical history of cirrhosis. EXAM: DUPLEX ULTRASOUND OF LIVER TECHNIQUE: Color and duplex Doppler ultrasound was performed to evaluate the hepatic in-flow and out-flow vessels. COMPARISON:  Ultrasound 05/12/2015 and previous FINDINGS: Portal Vein: No occlusion or thrombus. Velocities (all hepatopetal): Main:  18-20 cm/sec Right:  25 cm/sec Left:  16 cm/sec Intrahepatic IVC 57 cm/sec Hepatic Vein Velocities (all  hepatofugal): Right:  31 cm/sec Middle:  59 cm/sec Left:  38 cm/sec Hepatic Artery Velocity:  345 cm/sec Spleen 14.2 x 6.7 x 17.1 cm. Splenic Vein: Antegrade flow at hilum. Portosplenic confluence was not visualized due to overlying bowel gas. No occlusion or thrombus evident. Velocity: 16 cm/sec Varices: Not identified Ascites: Present IMPRESSION: 1. Normal hepatic vascular Doppler assessment. 2. Splenomegaly and abdominal ascites, as previously noted. Electronically Signed   By: Corlis Leak  Hassell M.D.   On: 05/16/2015 13:32   Medications: I have reviewed the patient's current medications. Scheduled Meds: . feeding supplement (ENSURE ENLIVE)  237 mL Oral BID BM  . furosemide  20 mg Oral Daily  . isosorbide dinitrate  20 mg Oral Daily  . lactulose  30 g Oral BID  . midodrine  5 mg Oral TID WC  . pantoprazole  40 mg Oral BID AC  . pregabalin  50 mg Oral QHS  . rosuvastatin  10 mg Oral BH-q7a  . spironolactone  25 mg Oral Daily   Continuous Infusions:  PRN Meds:.acetaminophen **OR** acetaminophen, alum & mag hydroxide-simeth, HYDROcodone-acetaminophen, ondansetron **OR** ondansetron (ZOFRAN) IV, senna-docusate   Assessment: Active Problems:   Ascites   Hepatic cirrhosis (HCC)    Plan: This patient has portal hypertension with all signs suggesting cirrhosis. The patient's cryptogenic cirrhosis may be due to fatty liver versus drug reaction.  Patient should be treated symptomatically. He should also follow up as an outpatient.   LOS: 6 days   Daren Saxon Barich 05/17/2015, 9:42 AM

## 2015-05-17 NOTE — Discharge Summary (Addendum)
[facility] - [place] at [facility]   PATIENT NAME: [name]    MR#:  [id number]  DATE OF BIRTH:  March 26, 1941  DATE OF ADMISSION:  05/11/2015 ADMITTING PHYSICIAN: Adrian Saran, MD  DATE OF DISCHARGE:05/17/2195 PRIMARY CARE PHYSICIAN: Corky Downs, MD    ADMISSION DIAGNOSIS:  Pneumonia [J18.9] Acute renal insufficiency [N28.9] Ascites [R18.8] Hepatic cirrhosis, unspecified hepatic cirrhosis type (HCC) [K74.60]  DISCHARGE DIAGNOSIS:  Active Problems:   Ascites   Hepatic cirrhosis (HCC)   SECONDARY DIAGNOSIS:   Past Medical History  Diagnosis Date  . A-fib (HCC)   . Dementia   . Hypertension   . High cholesterol   . GERD (gastroesophageal reflux disease)   . Acid reflux   . CHF (congestive heart failure) [facility])     HOSPITAL COURSE:   74 year old male with past medical history significant for chronic atrial fibrillation, dementia and hyperlipidemia who presented with shortness of breath and abdominal distention and noted to have liver cirrhosis with ascites. Further details please refer the H&P.  1. Ascites with portal hypertension suggesting liver cirrhosis: [name] may be due to fatty liver versus drug reaction from amiodarone. Patient was seen and evaluated by GI. Patient is being treated symptomatically. He underwent paracentesis on admission with over 5 L drawn. He will undergo paracentesis prior to discharge as well. He will continue Lasix and Aldactone. Doppler ultrasound showed no evidence of portal vein thrombosis.   2. Liver cirrhosis: As mentioned above this is likely due to [place] versus amiodarone-induced hepatotoxicity. Patient has no history of EtOH abuse. Hepatology serology are negative. HIV test is negative. AMA and anti-mitochondrial antibody is negative. Continue lactulose, Lasix and Aldactone.  3. Coagulopathy: This is secondary to use of Coumadin and underlying liver disease. INR improved with FFP and vitamin  K. No signs of acute bleeding. Patient will stay off of Coumadin indefinitely due to chronic liver disease.  4. History of chronic atrial fibrillation: Patient was seen and evaluated by cardiology during this hospitalization. Amiodarone has been discontinued due to possible liver-induced toxicity. Coumadin has also been discontinued due to liver cirrhosis. Patient is transitioned to full dose aspirin. Due to low blood pressure and good heart rate control low-dose beta blocker was not initiated. In the future patient made need this depending on his blood pressure.  5. Acute renal failure on chronic renal failure stage III: This is due to volume shifts from ascites and liver cirrhosis. Creatinine has somewhat improved. Patient was on albumin for oncotic report. Patient will be continued on middle drink. Patient will need follow-up in the next 2-3 days with nephrology.  6. Hyperlipidemia: Continue Crestor  7. History of diastolic congestive heart failure: Continue Aldactone ANd Lasix. Due to low normal blood pressureI MDUR has been stoppedFor now.  DISCHARGE CONDITIONS AND DIET:  Patient is stable for discharge on a low sodium fluid restricted diet.  CONSULTS OBTAINED:  Treatment Team:  Wallace Cullens, MD Alwyn Pea, MD Mosetta Pigeon, MD Midge Minium, MD Adrian Saran, MD  DRUG ALLERGIES:  No Known Allergies  DISCHARGE MEDICATIONS:   Current Discharge Medication List    START taking these medications   Details  aspirin (ASPIRIN CHILDRENS) 81 MG chewable tablet Chew 4 tablets (324 mg total) by mouth daily. Qty: 120 tablet, Refills: 0    feeding supplement, ENSURE ENLIVE, (ENSURE ENLIVE) LIQD Take 237 mLs by mouth 2 (two) times daily between meals. Qty: 237 mL, Refills: 12    lactulose (CHRONULAC) 10  GM/15ML solution Take 45 mLs (30 g total) by mouth 2 (two) times daily. Qty: 240 mL, Refills: 0    midodrine (PROAMATINE) 5 MG tablet Take 1 tablet (5 mg total) by mouth 3 (three) times  daily with meals. Qty: 90 tablet, Refills: 0    spironolactone (ALDACTONE) 25 MG tablet Take 1 tablet (25 mg total) by mouth daily. Qty: 30 tablet, Refills: 0      CONTINUE these medications which have CHANGED   Details  furosemide (LASIX) 20 MG tablet Take 1 tablet (20 mg total) by mouth daily. Qty: 30 tablet, Refills: 0      CONTINUE these medications which have NOT CHANGED   Details  alum & mag hydroxide-simeth (MAALOX/MYLANTA) 200-200-20 MG/5ML suspension Take 15 mLs by mouth every 6 (six) hours as needed for indigestion or heartburn.    dexlansoprazole (DEXILANT) 60 MG capsule Take 60 mg by mouth daily.    i     rosuvastatin (CRESTOR) 10 MG tablet Take 10 mg by mouth every morning.       STOP taking these medications     amiodarone (PACERONE) 200 MG tablet      clopidogrel (PLAVIX) 75 MG tablet      gabapentin (NEURONTIN) 100 MG capsule      warfarin (COUMADIN) 1 MG tablet   Isosorbide dinitrate (ISORDIL) 20 MG tablet            Today   CHIEF COMPLAINT:  No issues overnight. I spoke with patient's granddaughter who is power of attorney about plan of care.   VITAL SIGNS:  Blood pressure 101/52, pulse 83, temperature 97.9 F (36.6 C), temperature source Oral, resp. rate 20, height  (1.676 m), weight 80.74 kg (178 lb), SpO2 96 %.   REVIEW OF SYSTEMS:  Review of Systems  Unable to perform ROS: dementia     PHYSICAL EXAMINATION:  GENERAL:  74 y.o.-year-old patient lying in the bed with no acute distress.  NECK:  Supple, no jugular venous distention. No thyroid enlargement, no tenderness.  LUNGS: Normal breath sounds bilaterally, no wheezing, rales,rhonchi  No use of accessory muscles of respiration.  CARDIOVASCULAR: S1, S2 normal. No murmurs, rubs, or gallops.  ABDOMEN: Positive ascites without rebound or guarding hard to appreciate organomegaly due to body habitus. EXTREMITIES: No pedal edema, cyanosis, or clubbing.  PSYCHIATRIC: The patient  is alert and oriented x name  SKIN: No obvious rash, lesion, or ulcer.   DATA REVIEW:   CBC  Recent Labs Lab 05/15/15 0545  WBC 16.4*  HGB 11.0*  HCT 34.4*  PLT 140*    Chemistries   Recent Labs Lab 05/15/15 0545  05/17/15 0409  NA 136  < > 138  K 4.2  < > 4.0  CL 102  < > 107  CO2 25  < > 22  GLUCOSE 136*  < > 158*  BUN 24*  < > 29*  CREATININE 2.00*  < > 2.19*  CALCIUM 7.7*  < > 7.9*  AST 60*  --   --   ALT 31  --   --   ALKPHOS 450*  --   --   BILITOT 1.3*  --   --   < > = values in this interval not displayed.  Cardiac Enzymes  Recent Labs Lab 05/11/15 1601 05/11/15 2202 05/12/15 0514  TROPONINI 0.05* 0.04* 0.09*    Microbiology Results  @  RADIOLOGY:  [place] Art/ven Flow Abd Pelv Doppler  05/16/2015  CLINICAL DATA:  Portal  hypertension, ascites, no clinical history of cirrhosis. EXAM: DUPLEX ULTRASOUND OF LIVER TECHNIQUE: Color and duplex Doppler ultrasound was performed to evaluate the hepatic in-flow and out-flow vessels. COMPARISON:  Ultrasound 05/12/2015 and previous FINDINGS: Portal Vein: No occlusion or thrombus. Velocities (all hepatopetal): Main:  18-20 cm/sec Right:  25 cm/sec Left:  16 cm/sec Intrahepatic IVC 57 cm/sec Hepatic Vein Velocities (all hepatofugal): Right:  31 cm/sec Middle:  59 cm/sec Left:  38 cm/sec Hepatic Artery Velocity:  345 cm/sec Spleen 14.2 x 6.7 x 17.1 cm. Splenic Vein: Antegrade flow at hilum. Portosplenic confluence was not visualized due to overlying bowel gas. No occlusion or thrombus evident. Velocity: 16 cm/sec Varices: Not identified Ascites: Present IMPRESSION: 1. Normal hepatic vascular Doppler assessment. 2. Splenomegaly and abdominal ascites, as previously noted. Electronically Signed   By: Corlis Leak  Hassell M.D.   On: 05/16/2015 13:32      Management plans discussed with the patient and he is in agreement. Stable for discharge to skilled nursing facility  Patient should follow up with nephrology in 3 days  and PCP in one week GI in 2 weeks  CODE STATUS:     Code Status Orders        Start     Ordered   05/11/15 1532  Full code   Continuous     05/11/15 1532    Advance Directive Documentation        Most Recent Value   Type of Advance Directive  Healthcare Power of Attorney   Pre-existing out of facility DNR order (yellow form or pink MOST form)     "MOST" Form in Place?        TOTAL TIME TAKING CARE OF THIS PATIENT: 40 minutes.  Plan of care discussed with patient's granddaughter.  Note: This dictation was prepared with Dragon dictation along with smaller phrase technology. Any transcriptional errors that result from this process are unintentional.  Benita Boonstra M.D on 05/17/2015 at 9:44 AM  Between 7am to 6pm - Pager - [phone] After 6pm go to [email or web address] - password [email or web address] [facility]  [facility] [place] [facility]  Office  [phone]  CC: Primary care physician; Corky DownsMASOUD, JAVED, MD

## 2015-05-17 NOTE — Clinical Social Work Note (Signed)
[facility] to [facility] this evening. Discharge information sent earlier today and [facility] was made aware this morning. Patient's granddaughter has been notified and she stated we could proceed with the discharge. CSW informed her that EMS was running behind and that it could be later this evening before he was transported.  York SpanielMonica Tamerra Merkley MSW,LCSW [phone]

## 2015-05-17 NOTE — Progress Notes (Signed)
Physical Therapy Treatment Patient Details Name: [name] MRN: [id number] DOB: Dec 16, 1940 Today's Date: 05/17/2015    History of Present Illness Patient is a 74 y/o male that presents with shortness of breath and increasing abdominal girth.     PT Comments    Patient progressing well towards mobility goals and is able to complete community distance ambulation in this session. Patient is able to ambulate with 4WW at what appears to be his baseline or near to it. Berg balance score of 37/56 indicated he is still at high risk for falling. He would certainly benefit from additional PT services to increase his safety with mobility as well as supervision for all mobility.   Follow Up Recommendations  Home health PT;Supervision for mobility/OOB     Equipment Recommendations       Recommendations for Other Services       Precautions / Restrictions Precautions Precautions: Fall Restrictions Weight Bearing Restrictions: No    Mobility  Bed Mobility Overal bed mobility: Modified Independent Bed Mobility: Supine to Sit     Supine to sit: Modified independent (Device/Increase time)     General bed mobility comments: Patient is able to use arm rails, though prolonged time required to transfer supine to sit.   Transfers Overall transfer level: Needs assistance Equipment used: Rolling walker (2 wheeled) Transfers: Sit to/from Stand Sit to Stand: Supervision         General transfer comment: Patient is able to transfer sit to stand with RW and no loss of balance, though definite use of UEs.   Ambulation/Gait Ambulation/Gait assistance: Modified independent (Device/Increase time) Ambulation Distance (Feet):  (400+200' on 2nd bout) Assistive device: 4-wheeled walker Gait Pattern/deviations: WFL(Within Functional Limits)   Gait velocity interpretation: Below normal speed for age/gender General Gait Details: Patient demonstrates no loss of balance with ambulation and appropriate  use of hand brakes on 4WW. Step length is marginally declined from likely baseline, though he appears to have returned to baseline function.    Stairs            Wheelchair Mobility    Modified Rankin (Stroke Patients Only)       Balance Overall balance assessment: Needs assistance Sitting-balance support: No upper extremity supported Sitting balance-Leahy Scale: Good Sitting balance - Comments: No balance deficits.    Standing balance support: Bilateral upper extremity supported Standing balance-Leahy Scale: Fair Standing balance comment: Modified DGI of 10/12, 5x sit to stand 18 seconds with use of hands.                     Cognition Arousal/Alertness: Awake/alert Behavior During Therapy: WFL for tasks assessed/performed Overall Cognitive Status: History of cognitive impairments - at baseline                      Exercises      General Comments General comments (skin integrity, edema, etc.): Patient remains at high risk of falling.       Pertinent Vitals/Pain Pain Assessment: No/denies pain    Home Living                      Prior Function            PT Goals (current goals can now be found in the care plan section) Acute Rehab PT Goals Patient Stated Goal: To return home safely  PT Goal Formulation: With patient Time For Goal Achievement: 05/27/15 Potential to Achieve Goals: Good Progress towards PT  goals: Progressing toward goals    Frequency  Min 2X/week    PT Plan Discharge plan needs to be updated    Co-evaluation             End of Session Equipment Utilized During Treatment: Gait belt Activity Tolerance: Patient tolerated treatment well Patient left: in chair;with call bell/phone within reach;with chair alarm set;with family/visitor present     Time: [id number] PT Time Calculation (min) (ACUTE ONLY): 27 min  Charges:  $Gait Training: 8-22 mins $Therapeutic Exercise: 8-22 mins                    G  Codes:      Kerin RansomPatrick A McNamara, PT, DPT    05/17/2015, 9:48 AM

## 2015-05-20 LAB — PH, BODY FLUID: pH, Body Fluid: 8.7

## 2015-05-24 ENCOUNTER — Other Ambulatory Visit: Payer: Self-pay

## 2015-05-24 ENCOUNTER — Other Ambulatory Visit
Admission: RE | Admit: 2015-05-24 | Discharge: 2015-05-24 | Disposition: A | Discharge status: Home / Self Care | Payer: Medicare Other | Source: Ambulatory Visit | Attending: Gastroenterology | Admitting: Gastroenterology

## 2015-05-24 ENCOUNTER — Ambulatory Visit: Payer: Self-pay | Admitting: Gastroenterology

## 2015-05-24 DIAGNOSIS — B9681 Helicobacter pylori [H. pylori] as the cause of diseases classified elsewhere: Secondary | ICD-10-CM | POA: Insufficient documentation

## 2015-05-24 DIAGNOSIS — A048 Other specified bacterial intestinal infections: Secondary | ICD-10-CM

## 2015-05-25 ENCOUNTER — Other Ambulatory Visit: Payer: Self-pay

## 2015-05-25 ENCOUNTER — Telehealth: Payer: Self-pay

## 2015-05-25 LAB — MISC LABCORP TEST (SEND OUT): Labcorp test code: 163170

## 2015-05-25 NOTE — Telephone Encounter (Signed)
Grand daughter Shanda Bumps(Jessica) called today and stated pt was seen by Dr. Einar CrowMarshall Anderson on 05/23/15 and he felt pt was filling up with fluid again because he stomach was tight and pt complained of not being able to breath. She is asking us to order another paracentesis. Last one done on 05/17/15 and 1.5L was removed. Please let me know if you want him to have another. Pt was also restarted on Coumadin. Shanda BumpsJessica will give me the new updated med list. Do we need to send off sample of fluid? Add Albumin? Please advise.

## 2015-05-26 ENCOUNTER — Other Ambulatory Visit: Payer: Self-pay

## 2015-05-26 DIAGNOSIS — R188 Other ascites: Principal | ICD-10-CM

## 2015-05-26 DIAGNOSIS — K7460 Unspecified cirrhosis of liver: Secondary | ICD-10-CM

## 2015-05-26 NOTE — Telephone Encounter (Signed)
please set the patient up for paracentesis.

## 2015-05-26 NOTE — Telephone Encounter (Signed)
LVM for pt's grand daughter letting her know I have received the updated med list from [facility]. I have also left a vm with special procedures [phone] at [facility] to return my call to schedule paracentesis.

## 2015-05-27 NOTE — Telephone Encounter (Signed)
LVM for Shanda BumpsJessica (grand daughter) to return my call regarding pt's appt for the US paracentesis. [facility] has [facility] on Thursday, Jan 12th @ 1:30pm. Left this information on the vm and advised to call back as soon as she gets this. Also, contacted Dr. Fredna DowMasoud's office as pt is currently taking Coumadin daily. The office was closed today but I will call back on Monday.

## 2015-05-30 ENCOUNTER — Ambulatory Visit: Payer: Medicare Other | Admitting: Gastroenterology

## 2015-05-31 NOTE — Telephone Encounter (Signed)
Called pt's grand daughter today to make sure she had received my message about the paracentesis that is scheduled for on Thursday, Jan [facility]th [facility]. She stated she did receive my message and has been contacted by the US department for instructions. Per Radiologist, pt will not need to stop his coumadin 1mg .

## 2015-06-02 ENCOUNTER — Other Ambulatory Visit: Payer: Self-pay

## 2015-06-02 ENCOUNTER — Ambulatory Visit: Admission: RE | Admit: 2015-06-02 | Payer: Medicare Other | Source: Ambulatory Visit

## 2015-06-02 ENCOUNTER — Emergency Department: Payer: Medicare Other

## 2015-06-02 ENCOUNTER — Inpatient Hospital Stay
Admission: EM | Admit: 2015-06-02 | Discharge: 2015-06-08 | DRG: 433 | Disposition: A | Discharge status: Skilled Nursing Facility | Payer: Medicare Other | Attending: Internal Medicine | Admitting: Internal Medicine

## 2015-06-02 ENCOUNTER — Encounter: Payer: Self-pay | Admitting: *Deleted

## 2015-06-02 ENCOUNTER — Ambulatory Visit
Admission: RE | Admit: 2015-06-02 | Discharge: 2015-06-02 | Disposition: A | Discharge status: Home / Self Care | Payer: Medicare Other | Source: Ambulatory Visit | Attending: Gastroenterology | Admitting: Gastroenterology

## 2015-06-02 DIAGNOSIS — E875 Hyperkalemia: Secondary | ICD-10-CM | POA: Diagnosis present

## 2015-06-02 DIAGNOSIS — N184 Chronic kidney disease, stage 4 (severe): Secondary | ICD-10-CM | POA: Diagnosis present

## 2015-06-02 DIAGNOSIS — L89151 Pressure ulcer of sacral region, stage 1: Secondary | ICD-10-CM | POA: Diagnosis present

## 2015-06-02 DIAGNOSIS — Z951 Presence of aortocoronary bypass graft: Secondary | ICD-10-CM | POA: Diagnosis not present

## 2015-06-02 DIAGNOSIS — E871 Hypo-osmolality and hyponatremia: Secondary | ICD-10-CM | POA: Diagnosis present

## 2015-06-02 DIAGNOSIS — N179 Acute kidney failure, unspecified: Secondary | ICD-10-CM

## 2015-06-02 DIAGNOSIS — Z952 Presence of prosthetic heart valve: Secondary | ICD-10-CM

## 2015-06-02 DIAGNOSIS — K7460 Unspecified cirrhosis of liver: Principal | ICD-10-CM | POA: Diagnosis present

## 2015-06-02 DIAGNOSIS — K7290 Hepatic failure, unspecified without coma: Secondary | ICD-10-CM | POA: Diagnosis present

## 2015-06-02 DIAGNOSIS — I509 Heart failure, unspecified: Secondary | ICD-10-CM | POA: Diagnosis present

## 2015-06-02 DIAGNOSIS — Z87891 Personal history of nicotine dependence: Secondary | ICD-10-CM | POA: Diagnosis not present

## 2015-06-02 DIAGNOSIS — Z8249 Family history of ischemic heart disease and other diseases of the circulatory system: Secondary | ICD-10-CM | POA: Diagnosis not present

## 2015-06-02 DIAGNOSIS — Z79899 Other long term (current) drug therapy: Secondary | ICD-10-CM

## 2015-06-02 DIAGNOSIS — I4891 Unspecified atrial fibrillation: Secondary | ICD-10-CM | POA: Diagnosis present

## 2015-06-02 DIAGNOSIS — I951 Orthostatic hypotension: Secondary | ICD-10-CM | POA: Diagnosis present

## 2015-06-02 DIAGNOSIS — L8990 Pressure ulcer of unspecified site, unspecified stage: Secondary | ICD-10-CM | POA: Insufficient documentation

## 2015-06-02 DIAGNOSIS — R188 Other ascites: Principal | ICD-10-CM

## 2015-06-02 DIAGNOSIS — E785 Hyperlipidemia, unspecified: Secondary | ICD-10-CM | POA: Diagnosis present

## 2015-06-02 DIAGNOSIS — E860 Dehydration: Secondary | ICD-10-CM | POA: Diagnosis present

## 2015-06-02 DIAGNOSIS — E43 Unspecified severe protein-calorie malnutrition: Secondary | ICD-10-CM | POA: Insufficient documentation

## 2015-06-02 DIAGNOSIS — E7800 Pure hypercholesterolemia, unspecified: Secondary | ICD-10-CM | POA: Diagnosis present

## 2015-06-02 DIAGNOSIS — N189 Chronic kidney disease, unspecified: Secondary | ICD-10-CM

## 2015-06-02 DIAGNOSIS — Z7982 Long term (current) use of aspirin: Secondary | ICD-10-CM | POA: Diagnosis not present

## 2015-06-02 DIAGNOSIS — F0390 Unspecified dementia without behavioral disturbance: Secondary | ICD-10-CM | POA: Diagnosis present

## 2015-06-02 DIAGNOSIS — I130 Hypertensive heart and chronic kidney disease with heart failure and stage 1 through stage 4 chronic kidney disease, or unspecified chronic kidney disease: Secondary | ICD-10-CM | POA: Diagnosis present

## 2015-06-02 DIAGNOSIS — Z7902 Long term (current) use of antithrombotics/antiplatelets: Secondary | ICD-10-CM | POA: Diagnosis not present

## 2015-06-02 DIAGNOSIS — K219 Gastro-esophageal reflux disease without esophagitis: Secondary | ICD-10-CM | POA: Diagnosis present

## 2015-06-02 DIAGNOSIS — I959 Hypotension, unspecified: Secondary | ICD-10-CM

## 2015-06-02 DIAGNOSIS — A419 Sepsis, unspecified organism: Secondary | ICD-10-CM

## 2015-06-02 HISTORY — DX: Chronic kidney disease, unspecified: N18.9

## 2015-06-02 HISTORY — DX: Unspecified cirrhosis of liver: K74.60

## 2015-06-02 LAB — CBC WITH DIFFERENTIAL/PLATELET
BASOS PCT: 1 %
Basophils Absolute: 0.2 10*3/uL — ABNORMAL HIGH (ref 0–0.1)
EOS ABS: 0.4 10*3/uL (ref 0–0.7)
Eosinophils Relative: 1 %
HCT: 38.8 % — ABNORMAL LOW (ref 40.0–52.0)
HEMOGLOBIN: 12.3 g/dL — AB (ref 13.0–18.0)
LYMPHS ABS: 1.9 10*3/uL (ref 1.0–3.6)
Lymphocytes Relative: 7 %
MCH: 22.4 pg — AB (ref 26.0–34.0)
MCHC: 31.6 g/dL — AB (ref 32.0–36.0)
MCV: 71 fL — AB (ref 80.0–100.0)
MONO ABS: 2 10*3/uL — AB (ref 0.2–1.0)
MONOS PCT: 7 %
Neutro Abs: 22.4 10*3/uL — ABNORMAL HIGH (ref 1.4–6.5)
Neutrophils Relative %: 84 %
Platelets: 254 10*3/uL (ref 150–440)
RBC: 5.46 MIL/uL (ref 4.40–5.90)
RDW: 20 % — ABNORMAL HIGH (ref 11.5–14.5)
WBC: 26.8 10*3/uL — ABNORMAL HIGH (ref 3.8–10.6)

## 2015-06-02 LAB — TYPE AND SCREEN
ABO/RH(D): O NEG
ANTIBODY SCREEN: NEGATIVE

## 2015-06-02 LAB — COMPREHENSIVE METABOLIC PANEL
ALBUMIN: 2.5 g/dL — AB (ref 3.5–5.0)
ALK PHOS: 588 U/L — AB (ref 38–126)
ALT: 43 U/L (ref 17–63)
AST: 84 U/L — AB (ref 15–41)
Anion gap: 13 (ref 5–15)
BILIRUBIN TOTAL: 2.1 mg/dL — AB (ref 0.3–1.2)
BUN: 42 mg/dL — AB (ref 6–20)
CALCIUM: 8.2 mg/dL — AB (ref 8.9–10.3)
CO2: 17 mmol/L — ABNORMAL LOW (ref 22–32)
CREATININE: 3.23 mg/dL — AB (ref 0.61–1.24)
Chloride: 100 mmol/L — ABNORMAL LOW (ref 101–111)
GFR calc Af Amer: 20 mL/min — ABNORMAL LOW (ref >60)
GFR, EST NON AFRICAN AMERICAN: 17 mL/min — AB (ref >60)
GLUCOSE: 158 mg/dL — AB (ref 65–99)
Potassium: 5.2 mmol/L — ABNORMAL HIGH (ref 3.5–5.1)
Sodium: 130 mmol/L — ABNORMAL LOW (ref 135–145)
TOTAL PROTEIN: 5.8 g/dL — AB (ref 6.5–8.1)

## 2015-06-02 LAB — LIPASE, BLOOD: Lipase: 34 U/L (ref 11–51)

## 2015-06-02 LAB — PROTIME-INR
INR: 1.75
PROTHROMBIN TIME: 20.4 s — AB (ref 11.4–15.0)

## 2015-06-02 LAB — AMMONIA: Ammonia: 26 umol/L (ref 9–35)

## 2015-06-02 LAB — LACTIC ACID, PLASMA
LACTIC ACID, VENOUS: 2.7 mmol/L — AB (ref 0.5–2.0)
LACTIC ACID, VENOUS: 2.1 mmol/L — AB (ref 0.5–2.0)
Lactic Acid, Venous: 1.5 mmol/L (ref 0.5–2.0)

## 2015-06-02 MED ORDER — HEPARIN SODIUM (PORCINE) 5000 UNIT/ML IJ SOLN
5000.0000 [IU] | Freq: Three times a day (TID) | INTRAMUSCULAR | Status: DC
  Administered 2015-06-02 – 2015-06-08 (×17): 5000 [IU] via SUBCUTANEOUS
  Filled 2015-06-02 (×17): qty 1

## 2015-06-02 MED ORDER — CEFTRIAXONE SODIUM 1 G IJ SOLR
INTRAMUSCULAR | Status: AC
  Filled 2015-06-02: qty 10

## 2015-06-02 MED ORDER — ENSURE ENLIVE PO LIQD
237.0000 mL | Freq: Two times a day (BID) | ORAL | Status: DC
Start: 2015-06-03 — End: 2015-06-08
  Administered 2015-06-03 – 2015-06-08 (×9): 237 mL via ORAL

## 2015-06-02 MED ORDER — VANCOMYCIN HCL IN DEXTROSE 1-5 GM/200ML-% IV SOLN
1000.0000 mg | Freq: Once | INTRAVENOUS | Status: AC
  Administered 2015-06-02: 1000 mg via INTRAVENOUS
  Filled 2015-06-02: qty 200

## 2015-06-02 MED ORDER — MIDODRINE HCL 5 MG PO TABS
5.0000 mg | ORAL_TABLET | Freq: Three times a day (TID) | ORAL | Status: DC
  Administered 2015-06-03 (×3): 5 mg via ORAL
  Filled 2015-06-02 (×3): qty 1

## 2015-06-02 MED ORDER — PIPERACILLIN-TAZOBACTAM 3.375 G IVPB 30 MIN
3.3750 g | Freq: Once | INTRAVENOUS | Status: AC
  Administered 2015-06-02: 3.375 g via INTRAVENOUS
  Filled 2015-06-02: qty 50

## 2015-06-02 MED ORDER — SODIUM CHLORIDE 0.9 % IV BOLUS (SEPSIS)
2000.0000 mL | Freq: Once | INTRAVENOUS | Status: AC
  Administered 2015-06-02: 2000 mL via INTRAVENOUS

## 2015-06-02 MED ORDER — ASPIRIN 81 MG PO CHEW
324.0000 mg | CHEWABLE_TABLET | Freq: Every day | ORAL | Status: DC
  Administered 2015-06-03 – 2015-06-08 (×6): 324 mg via ORAL
  Filled 2015-06-02 (×6): qty 4

## 2015-06-02 MED ORDER — LACTULOSE 10 GM/15ML PO SOLN
30.0000 g | Freq: Two times a day (BID) | ORAL | Status: DC
  Administered 2015-06-02 – 2015-06-08 (×12): 30 g via ORAL
  Filled 2015-06-02 (×12): qty 60

## 2015-06-02 MED ORDER — PANTOPRAZOLE SODIUM 40 MG PO TBEC
40.0000 mg | DELAYED_RELEASE_TABLET | Freq: Every day | ORAL | Status: DC
  Administered 2015-06-03 – 2015-06-08 (×6): 40 mg via ORAL
  Filled 2015-06-02 (×6): qty 1

## 2015-06-02 MED ORDER — ROSUVASTATIN CALCIUM 5 MG PO TABS
10.0000 mg | ORAL_TABLET | ORAL | Status: DC
  Administered 2015-06-03 – 2015-06-08 (×6): 10 mg via ORAL
  Filled 2015-06-02 (×2): qty 1
  Filled 2015-06-02 (×2): qty 2
  Filled 2015-06-02 (×3): qty 1

## 2015-06-02 MED ORDER — DEXTROSE 5 % IV SOLN
1.0000 g | INTRAVENOUS | Status: DC
  Administered 2015-06-02 – 2015-06-06 (×5): 1 g via INTRAVENOUS
  Filled 2015-06-02 (×6): qty 10

## 2015-06-02 MED ORDER — SODIUM CHLORIDE 0.9 % IV BOLUS (SEPSIS)
500.0000 mL | Freq: Once | INTRAVENOUS | Status: AC
  Administered 2015-06-02: 500 mL via INTRAVENOUS

## 2015-06-02 MED ORDER — SODIUM POLYSTYRENE SULFONATE 15 GM/60ML PO SUSP
30.0000 g | Freq: Once | ORAL | Status: AC
  Administered 2015-06-02: 30 g via ORAL
  Filled 2015-06-02: qty 120

## 2015-06-02 NOTE — ED Notes (Addendum)
Pt arrives for hypotension, pt was to have parencentisis today but was sent to ED for low BP, pt has hx of liver cirhosis, pt awake and alert, pt states dizziness

## 2015-06-02 NOTE — Progress Notes (Signed)
Dr. Clint GuyHower notified of critical lactic acid. Discussed current treatment, no maintance fluids a this time, pt given fluid in ED. will continue to monitor blood pressure.

## 2015-06-02 NOTE — ED Provider Notes (Signed)
[facility] Emergency Department Provider Note  ____________________________________________  Time seen: 11:25 AM  I have reviewed the triage vital signs and the nursing notes.   HISTORY  Chief Complaint Hypotension    HPI [name] is a 75 y.o. male sent to the ED from GI clinic due to hypotension. He was scheduled for a routine outpatient paracentesis today due to cirrhosis and ascites but with his hypotension was sent to the ED. He denies abdominal pain but complains of dizziness. No chest pain shortness of breath fever chills or other symptoms at this time.Denies vomiting melena or bloody stool.     Past Medical History  Diagnosis Date  . A-fib (HCC)   . Dementia   . Hypertension   . High cholesterol   . GERD (gastroesophageal reflux disease)   . Acid reflux   . CHF (congestive heart failure) (HCC)   . Liver cirrhosis [facility])      Patient Active Problem List   Diagnosis Date Noted  . Portal hypertension (HCC)   . Hepatic cirrhosis (HCC)   . Ascites 05/11/2015  . Gastritis and gastroduodenitis   . Gastroesophageal reflux disease without esophagitis   . Chest pain 03/12/2015     Past Surgical History  Procedure Laterality Date  . Aortic valve replacement    . Cardiac bypass     . Coronary artery bypass graft    . Esophagogastroduodenoscopy (egd) with propofol N/A 05/03/2015    Procedure: ESOPHAGOGASTRODUODENOSCOPY (EGD) WITH PROPOFOL;  Surgeon: Midge Minium, MD;  Location: [facility] ENDOSCOPY;  Service: Endoscopy;  Laterality: N/A;     Current Outpatient Rx  Name  Route  Sig  Dispense  Refill  . alum & mag hydroxide-simeth (MAALOX/MYLANTA) 200-200-20 MG/5ML suspension   Oral   Take 15 mLs by mouth every 6 (six) hours as needed for indigestion or heartburn.         Marland Kitchen aspirin (ASPIRIN CHILDRENS) 81 MG chewable tablet   Oral   Chew 4 tablets (324 mg total) by mouth daily.   120 tablet   0   . dexlansoprazole (DEXILANT) 60 MG  capsule   Oral   Take 60 mg by mouth daily. Reported on 06/02/2015         . feeding supplement, ENSURE ENLIVE, (ENSURE ENLIVE) LIQD   Oral   Take 237 mLs by mouth 2 (two) times daily between meals. Patient not taking: Reported on 06/02/2015   237 mL   12   . furosemide (LASIX) 20 MG tablet   Oral   Take 1 tablet (20 mg total) by mouth daily.   30 tablet   0   . lactulose (CHRONULAC) 10 GM/15ML solution   Oral   Take 45 mLs (30 g total) by mouth 2 (two) times daily.   240 mL   0   . midodrine (PROAMATINE) 5 MG tablet   Oral   Take 1 tablet (5 mg total) by mouth 3 (three) times daily with meals.   90 tablet   0   . rosuvastatin (CRESTOR) 10 MG tablet   Oral   Take 10 mg by mouth every morning.          Marland Kitchen spironolactone (ALDACTONE) 25 MG tablet   Oral   Take 1 tablet (25 mg total) by mouth daily.   30 tablet   0   . XARELTO 20 MG TABS tablet   Oral   Take 20 mg by mouth daily. Reported on 06/02/2015  0     Dispense as written.      Allergies Review of patient's allergies indicates no known allergies.   Family History  Problem Relation Age of Onset  . Cancer Mother   . Hypertension Father   . CAD Father     Social History Social History  Substance Use Topics  . Smoking status: Never Smoker   . Smokeless tobacco: Former Neurosurgeon    Types: Snuff, Chew  . Alcohol Use: No    Review of Systems  Constitutional:   No fever or chills. No weight changes Eyes:   No blurry vision or double vision.  ENT:   No sore throat. Cardiovascular:   No chest pain. Respiratory:   No dyspnea or cough. Gastrointestinal:   Negative for abdominal pain, vomiting and diarrhea.  No BRBPR or melena. Genitourinary:   Negative for dysuria, urinary retention, bloody urine, or difficulty urinating. Musculoskeletal:   Negative for back pain. No joint swelling or pain. Skin:   Negative for rash. Neurological:   Negative for headaches, focal weakness or numbness. Positive  dizziness Psychiatric:  No anxiety or depression.   Endocrine:  No hot/cold intolerance, changes in energy, or sleep difficulty.  10-point ROS otherwise negative.  ____________________________________________   PHYSICAL EXAM:  VITAL SIGNS: ED Triage Vitals  Enc Vitals Group     BP 06/02/15 1127 66/45 mmHg     Pulse Rate 06/02/15 1127 77     Resp 06/02/15 1127 18     Temp --      Temp src --      SpO2 06/02/15 1127 97 %     Weight 06/02/15 1127 178 lb (80.74 kg)     Height 06/02/15 1127 5\' 6"  (1.676 m)     Head Cir --      Peak Flow --      Pain Score 06/02/15 1128 0     Pain Loc --      Pain Edu? --      Excl. in GC? --     Vital signs reviewed, nursing assessments reviewed.   Constitutional:   Alert and oriented to person and place . Well appearing and in no distress. Eyes:   No scleral icterus. No conjunctival pallor. PERRL. EOMI ENT   Head:   Normocephalic and atraumatic.   Nose:   No congestion/rhinnorhea. No septal hematoma   Mouth/Throat:Dry mucous membranes , no pharyngeal erythema. No peritonsillar mass. No uvula shift.   Neck:   No stridor. No SubQ emphysema. No meningismus. Hematological/Lymphatic/Immunilogical:   No cervical lymphadenopathy. Cardiovascular:   RRR. Normal and symmetric distal pulses are present in all extremities. No murmurs, rubs, or gallops. Respiratory:   Normal respiratory effort without tachypnea nor retractions. Breath sounds are clear and equal bilaterally. No wheezes/rales/rhonchi. Gastrointestinal:   Soft and nontender.Moderate distention with ascites. no CVA tenderness.  No rebound, rigidity, or guarding. Genitourinary:   deferred Musculoskeletal:   Nontender with normal range of motion in all extremities. No joint effusions.  No lower extremity tenderness.  No edema. Neurologic:   Normal speech and language.  CN 2-10 normal. Motor grossly intact. No pronator drift.  Normal gait. No gross focal neurologic deficits  are appreciated.  Skin:    Skin is warm, dry and intact. No rash noted.  No petechiae, purpura, or bullae. Psychiatric:   Mood and affect are normal. Speech and behavior are normal. Patient exhibits appropriate insight and judgment.  ____________________________________________    LABS (pertinent positives/negatives) (all labs  ordered are listed, but only abnormal results are displayed) Labs Reviewed  COMPREHENSIVE METABOLIC PANEL - Abnormal; Notable for the following:    Sodium 130 (*)    Potassium 5.2 (*)    Chloride 100 (*)    CO2 17 (*)    Glucose, Bld 158 (*)    BUN 42 (*)    Creatinine, Ser 3.23 (*)    Calcium 8.2 (*)    Total Protein 5.8 (*)    Albumin 2.5 (*)    AST 84 (*)    Alkaline Phosphatase 588 (*)    Total Bilirubin 2.1 (*)    GFR calc non Af Amer 17 (*)    GFR calc Af Amer 20 (*)    All other components within normal limits  LACTIC ACID, PLASMA - Abnormal; Notable for the following:    Lactic Acid, Venous 2.7 (*)    All other components within normal limits  CBC WITH DIFFERENTIAL/PLATELET - Abnormal; Notable for the following:    WBC 26.8 (*)    Hemoglobin 12.3 (*)    HCT 38.8 (*)    MCV 71.0 (*)    MCH 22.4 (*)    MCHC 31.6 (*)    RDW 20.0 (*)    Neutro Abs 22.4 (*)    Monocytes Absolute 2.0 (*)    Basophils Absolute 0.2 (*)    All other components within normal limits  PROTIME-INR - Abnormal; Notable for the following:    Prothrombin Time 20.4 (*)    All other components within normal limits  CULTURE, BLOOD (ROUTINE X 2)  CULTURE, BLOOD (ROUTINE X 2)  URINE CULTURE  LIPASE, BLOOD  AMMONIA  LACTIC ACID, PLASMA  URINALYSIS COMPLETEWITH MICROSCOPIC ([facility] ONLY)  TYPE AND SCREEN   ____________________________________________   EKG  Interpreted by me Normal sinus rhythm rate of 73, normal axis. First-degree AV block. 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 No acute ischemic changes in ST segments or T  waves.  ____________________________________________    RADIOLOGY  Chest x-ray unremarkable  ____________________________________________   PROCEDURES CRITICAL CARE Performed by: Scotty Court, Zabria Liss   Total critical care time: 35 minutes  Critical care time was exclusive of separately billable procedures and treating other patients.  Critical care was necessary to treat or prevent imminent or life-threatening deterioration.  Critical care was time spent personally by me on the following activities: development of treatment plan with patient and/or surrogate as well as nursing, discussions with consultants, evaluation of patient's response to treatment, examination of patient, obtaining history from patient or surrogate, ordering and performing treatments and interventions, ordering and review of laboratory studies, ordering and review of radiographic studies, pulse oximetry and re-evaluation of patient's condition.   ____________________________________________   INITIAL IMPRESSION / ASSESSMENT AND PLAN / ED COURSE  Pertinent labs & imaging results that were available during my care of the patient were reviewed by me and considered in my medical decision making (see chart for details).  Patient presents with hypotension in the setting of ascites. Abdomen is completely benign and is no tenderness to suggest that this is peritonitis. Lungs are also clear. We'll resuscitate with IV fluid boluses while getting labs. He is afebrile and there is no clear infectious source at this time.    ----------------------------------------- 1:05 PM 06/02/2015 -----------------------------------------  lactate elevated at 2.7. Also found to have acute on chronic renal failure and a markedly leukocytosis. This point suspect he may be septic from UTI. We'll start the patient on vancomycin  and Zosyn, continue IV fluids, plan for hospitalization.      ----------------------------------------- 2:56 PM on 06/02/2015 -----------------------------------------  Blood pressure normalizing with fluids. Patient not in distress. Tinea antibodies, plan for admission.      ____________________________________________   FINAL CLINICAL IMPRESSION(S) / ED DIAGNOSES  Final diagnoses:  Cirrhosis of liver with ascites, unspecified hepatic cirrhosis type (HCC)  Acute renal failure, unspecified acute renal failure type (HCC)  Hypotension, unspecified hypotension type  Sepsis, due to unspecified organism [facility](HCC)      Sharman CheekPhillip Karstyn Birkey, MD 06/02/15 1457

## 2015-06-02 NOTE — ED Notes (Signed)
[facility]..Mar[facility] Kitchen

## 2015-06-02 NOTE — ED Notes (Signed)
Brought in by family with low b/p   Was scheduled for paracentesis and they were unable to do  D/t low b/p  Denies any pain

## 2015-06-02 NOTE — Progress Notes (Signed)
MD paged

## 2015-06-02 NOTE — H&P (Addendum)
[facility] - [place] at [facility]   PATIENT NAME: [name]    MR#:  [id number]  DATE OF BIRTH:  [birth year]  DATE OF ADMISSION:  06/02/2015  PRIMARY CARE PHYSICIAN: Corky Downs, MD   REQUESTING/REFERRING PHYSICIAN: Dr. Salvadore Dom.  CHIEF COMPLAINT:   Chief Complaint  Patient presents with  . Hypotension    HISTORY OF PRESENT ILLNESS: [name]  is a 75 y.o. male with a known history of A. fib, hypertension, high cholesterol, CHF, liver cirrhosis, chronic kidney disease- was admitted to hospital in last week of December and paracentesis was done which was negative for any infection. After that he was discharged on spironolactone and Lasix at home but could not give any beta blockers for his CHF and A. fib because of borderline low blood pressure. He was advised to follow with GI clinic, which she was supposed to follow today. But he was feeling dizzy since today morning when he woke up and so daughter-in-law brought into emergency room. He was noted to have blood pressure running in 60s and 70s systolic, but patient was completely asymptomatic and after giving fluid boluses blood pressure was normal. Given to hospitalist team for admission for this issue.  Also noted to have worsening in the renal function and slight hyperkalemia plus his white cell count is elevated.  PAST MEDICAL HISTORY:   Past Medical History  Diagnosis Date  . A-fib (HCC)   . Dementia   . Hypertension   . High cholesterol   . GERD (gastroesophageal reflux disease)   . Acid reflux   . CHF (congestive heart failure) (HCC)   . Liver cirrhosis (HCC)   . Chronic kidney disease     PAST SURGICAL HISTORY: Past Surgical History  Procedure Laterality Date  . Aortic valve replacement    . Cardiac bypass     . Coronary artery bypass graft    . Esophagogastroduodenoscopy (egd) with propofol N/A 05/03/2015    Procedure: ESOPHAGOGASTRODUODENOSCOPY (EGD) WITH PROPOFOL;  Surgeon:  Midge Minium, MD;  Location: [facility] ENDOSCOPY;  Service: Endoscopy;  Laterality: N/A;    SOCIAL HISTORY:  Social History  Substance Use Topics  . Smoking status: Never Smoker   . Smokeless tobacco: Former Neurosurgeon    Types: Snuff, Chew  . Alcohol Use: No    FAMILY HISTORY:  Family History  Problem Relation Age of Onset  . Cancer Mother   . Hypertension Father   . CAD Father     DRUG ALLERGIES: No Known Allergies  REVIEW OF SYSTEMS:   CONSTITUTIONAL: No fever, fatigue or weakness. Have dizziness on standing up. EYES: No blurred or double vision.  EARS, NOSE, AND THROAT: No tinnitus or ear pain.  RESPIRATORY: No cough, shortness of breath, wheezing or hemoptysis.  CARDIOVASCULAR: No chest pain, orthopnea, edema.  GASTROINTESTINAL: No nausea, vomiting, diarrhea , have some abdominal pain.  GENITOURINARY: No dysuria, hematuria.  ENDOCRINE: No polyuria, nocturia,  HEMATOLOGY: No anemia, easy bruising or bleeding SKIN: No rash or lesion. MUSCULOSKELETAL: No joint pain or arthritis.   NEUROLOGIC: No tingling, numbness, weakness.  PSYCHIATRY: No anxiety or depression.   MEDICATIONS AT HOME:  Prior to Admission medications   Medication Sig Start Date End Date Taking? Authorizing Provider  alum & mag hydroxide-simeth (MAALOX/MYLANTA) 200-200-20 MG/5ML suspension Take 15 mLs by mouth every 6 (six) hours as needed for indigestion or heartburn.    Historical Provider, MD  aspirin (ASPIRIN CHILDRENS) 81 MG chewable tablet Chew 4 tablets (324 mg  total) by mouth daily. 05/17/15   Adrian Saran, MD  dexlansoprazole (DEXILANT) 60 MG capsule Take 60 mg by mouth daily. Reported on 06/02/2015    Historical Provider, MD  feeding supplement, ENSURE ENLIVE, (ENSURE ENLIVE) LIQD Take 237 mLs by mouth 2 (two) times daily between meals. Patient not taking: Reported on 06/02/2015 05/17/15   Adrian Saran, MD  furosemide (LASIX) 20 MG tablet Take 1 tablet (20 mg total) by mouth daily. 05/17/15   Adrian Saran, MD   lactulose (CHRONULAC) 10 GM/15ML solution Take 45 mLs (30 g total) by mouth 2 (two) times daily. 05/17/15   Adrian Saran, MD  midodrine (PROAMATINE) 5 MG tablet Take 1 tablet (5 mg total) by mouth 3 (three) times daily with meals. 05/17/15   Adrian Saran, MD  rosuvastatin (CRESTOR) 10 MG tablet Take 10 mg by mouth every morning.     Historical Provider, MD  spironolactone (ALDACTONE) 25 MG tablet Take 1 tablet (25 mg total) by mouth daily. 05/17/15   Sital Mody, MD  XARELTO 20 MG TABS tablet Take 20 mg by mouth daily. Reported on 06/02/2015 04/28/15   Historical Provider, MD      PHYSICAL EXAMINATION:   VITAL SIGNS: Blood pressure 121/68, pulse 71, resp. rate 19, height 5\' 6"  (1.676 m), weight 80.74 kg (178 lb), SpO2 97 %.  GENERAL:  75 y.o.-year-old patient lying in the bed with no acute distress.  EYES: Pupils equal, round, reactive to light and accommodation. No scleral icterus. Extraocular muscles intact.  HEENT: Head atraumatic, normocephalic. Oropharynx and nasopharynx clear.  NECK:  Supple, no jugular venous distention. No thyroid enlargement, no tenderness.  LUNGS: Normal breath sounds bilaterally, no wheezing, rales,rhonchi or crepitation. No use of accessory muscles of respiration.  CARDIOVASCULAR: S1, S2 normal, irregular. No murmurs, rubs, or gallops.  ABDOMEN: Soft, mild right lower quadrant  Tenderness, distended. dullness present on percussion.  Bowel sounds present. No organomegaly or mass.  EXTREMITIES: No pedal edema, cyanosis, or clubbing.  NEUROLOGIC: Cranial nerves II through XII are intact. Muscle strength 5/5 in all extremities. Sensation intact. Gait not checked.  PSYCHIATRIC: The patient is alert and oriented x 3.  SKIN: No obvious rash, lesion, or ulcer.   LABORATORY PANEL:   CBC  Recent Labs Lab 06/02/15 1146  WBC 26.8*  HGB 12.3*  HCT 38.8*  PLT 254  MCV 71.0*  MCH 22.4*  MCHC 31.6*  RDW 20.0*  LYMPHSABS 1.9  MONOABS 2.0*  EOSABS 0.4  BASOSABS 0.2*    ------------------------------------------------------------------------------------------------------------------  Chemistries   Recent Labs Lab 06/02/15 1146  NA 130*  K 5.2*  CL 100*  CO2 17*  GLUCOSE 158*  BUN 42*  CREATININE 3.23*  CALCIUM 8.2*  AST 84*  ALT 43  ALKPHOS 588*  BILITOT 2.1*   ------------------------------------------------------------------------------------------------------------------ estimated creatinine clearance is 20 mL/min (by C-G formula based on Cr of 3.23). ------------------------------------------------------------------------------------------------------------------ No results for input(s): TSH, T4TOTAL, T3FREE, THYROIDAB in the last 72 hours.  Invalid input(s): FREET3   Coagulation profile  Recent Labs Lab 06/02/15 1146  INR 1.75   ------------------------------------------------------------------------------------------------------------------- No results for input(s): DDIMER in the last 72 hours. -------------------------------------------------------------------------------------------------------------------  Cardiac Enzymes No results for input(s): CKMB, TROPONINI, MYOGLOBIN in the last 168 hours.  Invalid input(s): CK ------------------------------------------------------------------------------------------------------------------ Invalid input(s): POCBNP  ---------------------------------------------------------------------------------------------------------------  Urinalysis    Component Value Date/Time   COLORURINE AMBER* 05/11/2015 1815   COLORURINE Yellow 05/23/2011 1830   APPEARANCEUR CLOUDY* 05/11/2015 1815   APPEARANCEUR Clear 05/23/2011 1830   LABSPEC 1.021 05/11/2015 1815  LABSPEC 1.009 05/23/2011 1830   PHURINE 5.0 05/11/2015 1815   PHURINE 6.0 05/23/2011 1830   GLUCOSEU NEGATIVE 05/11/2015 1815   GLUCOSEU Negative 05/23/2011 1830   HGBUR NEGATIVE 05/11/2015 1815   HGBUR Negative 05/23/2011 1830    BILIRUBINUR NEGATIVE 05/11/2015 1815   BILIRUBINUR Negative 05/23/2011 1830   KETONESUR NEGATIVE 05/11/2015 1815   KETONESUR Negative 05/23/2011 1830   PROTEINUR 30* 05/11/2015 1815   PROTEINUR Negative 05/23/2011 1830   NITRITE NEGATIVE 05/11/2015 1815   NITRITE Negative 05/23/2011 1830   LEUKOCYTESUR NEGATIVE 05/11/2015 1815   LEUKOCYTESUR Negative 05/23/2011 1830     RADIOLOGY: Dg Chest 2 View  06/02/2015  CLINICAL DATA:  Acute hypotension today. EXAM: CHEST  2 VIEW COMPARISON:  Single-view of the chest 05/11/2015. PA and lateral chest 04/26/2015. CT chest 02/21/2014. FINDINGS: Lung volumes are low with mild, chronic basilar atelectasis or scar, more notable on the left. Prominent epicardial fat on the left is noted. No pneumothorax or pleural effusion. Heart size is normal. The patient is status post CABG aortic valve replacement. IMPRESSION: No acute disease in a low volume chest. Electronically Signed   By: Drusilla Kannerhomas  Dalessio M.D.   On: 06/02/2015 13:45    EKG: Orders placed or performed in visit on 06/02/15  . EKG 12-Lead    IMPRESSION AND PLAN:  * Acute on chronic renal failure with hyperkalemia  Most likely this is secondary to hypotension and use of diuretics.  I will hold his spironolactone and Lasix at this point.  Monitor renal function tomorrow  Because of presence of ascites and liver failure I would not like to give IV fluids.  * Hyperkalemia  Give Kayexalate oral.  No EKG changes, monitor tomorrow.  * Ascites., Elevated white cell count-possible SBP  We will do a therapeutic paracentesis and send cell count and albumin from the sample.  ER gave 1 dose of vancomycin and Zosyn, I will give Rocephin from tomorrow.  * Hypotension  This and is secondary to combination of use of diuretics, and dehydration, and possible infection.  Responded nicely to IV fluid, currently blood pressure is stable.  Hold Diuretics.  * A. Fib  Patient is not on any rate  controlling medications but heart rate is under control.  Not a candidate for beta blocker because of presence of hypotension.  He was on Xarelto, I will hold it because of renal failure and requirement of paracentesis.  All the records are reviewed and case discussed with ED provider. Management plans discussed with the patient, family and they are in agreement.  CODE STATUS: Code Status History    Date Active Date Inactive Code Status Order ID Comments User Context   05/11/2015  3:32 PM 05/17/2015  9:41 PM Full Code [id number]  Adrian SaranSital Mody, MD Inpatient   03/12/2015 12:58 PM 03/13/2015  3:32 PM Full Code [id number]  Enid Baasadhika Kalisetti, MD Inpatient    Advance Directive Documentation        Most Recent Value   Type of Advance Directive  Healthcare Power of Attorney   Pre-existing out of facility DNR order (yellow form or pink MOST form)     "MOST" Form in Place?         TOTAL TIME TAKING CARE OF THIS PATIENT: 50 critical care  minutes.    Altamese DillingVACHHANI, Gursimran Litaker M.D on 06/02/2015   Between 7am to 6pm - Pager - [phone]  After 6pm go to [email or web address] - Scientist, research (life sciences)password [email or web address]  [facility]  Office  [phone]  CC: Primary care physician; Corky Downs, MD   Note: This dictation was prepared with Dragon dictation along with smaller phrase technology. Any transcriptional errors that result from this process are unintentional.

## 2015-06-02 NOTE — ED Notes (Signed)
Moved in to room 3  Denies any pain   Monitor shows sinus. Resting at present.

## 2015-06-03 ENCOUNTER — Inpatient Hospital Stay: Payer: Medicare Other

## 2015-06-03 DIAGNOSIS — L8990 Pressure ulcer of unspecified site, unspecified stage: Secondary | ICD-10-CM | POA: Insufficient documentation

## 2015-06-03 LAB — URINALYSIS COMPLETE WITH MICROSCOPIC (ARMC ONLY)
BILIRUBIN URINE: NEGATIVE
GLUCOSE, UA: NEGATIVE mg/dL
HGB URINE DIPSTICK: NEGATIVE
KETONES UR: NEGATIVE mg/dL
LEUKOCYTES UA: NEGATIVE
NITRITE: NEGATIVE
Protein, ur: 30 mg/dL — AB
Specific Gravity, Urine: 1.024 (ref 1.005–1.030)
pH: 5 (ref 5.0–8.0)

## 2015-06-03 LAB — CBC
HEMATOCRIT: 36.8 % — AB (ref 40.0–52.0)
HEMOGLOBIN: 11.4 g/dL — AB (ref 13.0–18.0)
MCH: 21.9 pg — AB (ref 26.0–34.0)
MCHC: 31 g/dL — AB (ref 32.0–36.0)
MCV: 70.6 fL — AB (ref 80.0–100.0)
Platelets: 165 10*3/uL (ref 150–440)
RBC: 5.21 MIL/uL (ref 4.40–5.90)
RDW: 20.5 % — ABNORMAL HIGH (ref 11.5–14.5)
WBC: 20.1 10*3/uL — ABNORMAL HIGH (ref 3.8–10.6)

## 2015-06-03 LAB — ALBUMIN, FLUID (OTHER): Albumin, Fluid: <1 g/dL

## 2015-06-03 LAB — BASIC METABOLIC PANEL
ANION GAP: 10 (ref 5–15)
BUN: 39 mg/dL — ABNORMAL HIGH (ref 6–20)
CALCIUM: 7.4 mg/dL — AB (ref 8.9–10.3)
CHLORIDE: 102 mmol/L (ref 101–111)
CO2: 18 mmol/L — AB (ref 22–32)
Creatinine, Ser: 3.06 mg/dL — ABNORMAL HIGH (ref 0.61–1.24)
GFR calc Af Amer: 22 mL/min — ABNORMAL LOW (ref >60)
GFR calc non Af Amer: 19 mL/min — ABNORMAL LOW (ref >60)
GLUCOSE: 167 mg/dL — AB (ref 65–99)
Potassium: 3.9 mmol/L (ref 3.5–5.1)
Sodium: 130 mmol/L — ABNORMAL LOW (ref 135–145)

## 2015-06-03 LAB — BODY FLUID CELL COUNT WITH DIFFERENTIAL
EOS FL: 0 %
Lymphs, Fluid: 39 %
MONOCYTE-MACROPHAGE-SEROUS FLUID: 22 %
Neutrophil Count, Fluid: 39 %
Other Cells, Fluid: 0 %
WBC FLUID: 72 uL

## 2015-06-03 LAB — PATHOLOGIST SMEAR REVIEW

## 2015-06-03 LAB — PROTEIN, BODY FLUID: Total protein, fluid: <3 g/dL

## 2015-06-03 MED ORDER — ALBUMIN HUMAN 25 % IV SOLN
12.5000 g | Freq: Every day | INTRAVENOUS | Status: AC
  Administered 2015-06-03 – 2015-06-05 (×3): 12.5 g via INTRAVENOUS
  Filled 2015-06-03 (×4): qty 50

## 2015-06-03 MED ORDER — OXYCODONE HCL 5 MG PO TABS
5.0000 mg | ORAL_TABLET | Freq: Four times a day (QID) | ORAL | Status: DC | PRN
  Administered 2015-06-03: 5 mg via ORAL
  Filled 2015-06-03: qty 1

## 2015-06-03 MED ORDER — GABAPENTIN 300 MG PO CAPS
300.0000 mg | ORAL_CAPSULE | Freq: Every day | ORAL | Status: DC
  Administered 2015-06-03 – 2015-06-07 (×6): 300 mg via ORAL
  Filled 2015-06-03 (×7): qty 1

## 2015-06-03 NOTE — Progress Notes (Signed)
Patient returned from paracentesis and had 8.25 liters of fluid off abdomen.  Patient states that he feels much better and pain has resolved.

## 2015-06-03 NOTE — Progress Notes (Signed)
Initial Nutrition Assessment  DOCUMENTATION CODES:   Severe malnutrition in context of chronic illness  INTERVENTION:   Meals and Snacks: Cater to patient preferences on dysphagia III diet order Medical Food Supplement Therapy: continue Ensure Enlive po BID, each supplement provides 350 kcal and 20 grams of protein, will add Magic Cup BID as well Coordination of Care: will recommend collecting daily weights   NUTRITION DIAGNOSIS:   Malnutrition related to chronic illness as evidenced by severe depletion of muscle mass, moderate depletion of body fat, severe fluid accumulation.  GOAL:   Patient will meet greater than or equal to 90% of their needs  MONITOR:    (Energy Intake, Anthropometrics, Digestive system, Heaptic Profile, Electrolyte and Renal Profile)  REASON FOR ASSESSMENT:   Consult Poor PO  ASSESSMENT:   Pt admitted with hypotension, s/p paracentesis 1/13, 8.5L removed. Pt with h/o liver failure.  Past Medical History  Diagnosis Date  . A-fib ([place])   . Dementia   . Hypertension   . High cholesterol   . GERD (gastroesophageal reflux disease)   . Acid reflux   . CHF (congestive heart failure) ([place])   . Liver cirrhosis ([place])   . Chronic kidney disease     Diet Order:  DIET DYS 3 Room service appropriate?: Yes; Fluid consistency:: Thin    Current Nutrition: Pt reports eating green beans for lunch. Pt drank 100% of Ensure at bedside.  Food/Nutrition-Related History: Pt reports poor po intake PTA.  When asked how long po intake has been going on, pt replies 'not that long,' but nods that it has been about a week. Pt reports not having an appetite at all.    Scheduled Medications:  . aspirin  324 mg Oral Daily  . cefTRIAXone (ROCEPHIN)  IV  1 g Intravenous Q24H  . feeding supplement (ENSURE ENLIVE)  237 mL Oral BID BM  . heparin  5,000 Units Subcutaneous 3 times per day  . lactulose  30 g Oral BID  . midodrine  5 mg Oral TID WC  . pantoprazole  40 mg  Oral QAC breakfast  . rosuvastatin  10 mg Oral BH-q7a     Electrolyte/Renal Profile and Glucose Profile:   Recent Labs Lab 06/02/15 1146 06/03/15 0608  NA 130* 130*  K 5.2* 3.9  CL 100* 102  CO2 17* 18*  BUN 42* 39*  CREATININE 3.23* 3.06*  CALCIUM 8.2* 7.4*  GLUCOSE 158* 167*   Protein Profile:  Recent Labs Lab 06/02/15 1146  ALBUMIN 2.5*    Hepatic Function Latest Ref Rng 06/02/2015 05/15/2015 05/14/2015  Total Protein 6.5 - 8.1 g/dL 5.8(L) 5.0(L) 4.8(L)  Albumin 3.5 - 5.0 g/dL 2.5(L) 2.2(L) 2.2(L)  AST 15 - 41 U/L 84(H) 60(H) 62(H)  ALT 17 - 63 U/L 43 31 31  Alk Phosphatase 38 - 126 U/L 588(H) 450(H) 434(H)  Total Bilirubin 0.3 - 1.2 mg/dL 2.1(H) 1.3(H) 1.1  Bilirubin, Direct 0.1 - 0.5 mg/dL - - -     Gastrointestinal Profile: Last BM:  06/03/2015   Nutrition-Focused Physical Exam Findings: Nutrition-Focused physical exam completed. Findings are moderate fat depletion, moderate-severe muscle depletion, and no edema in extremities, however pt with ascites present. RD notes 8.25L removed this afternoon via paracentesis.    Weight Change: Pt reports weight of 170lbs UBW.  Noted weight of 186lbs on 04/29/2015   Skin:   (Stage I Sacrum pressure ulcer)   Height:   Ht Readings from Last 1 Encounters:  06/02/15 _0  (1.676 m)  Weight:   Wt Readings from Last 1 Encounters:  06/02/15 178 lb (80.74 kg)    Wt Readings from Last 10 Encounters:  06/02/15 178 lb (80.74 kg)  05/11/15 178 lb (80.74 kg)  05/03/15 176 lb (79.833 kg)  04/29/15 186 lb 11.2 oz (84.687 kg)  04/26/15 176 lb 9.6 oz (80.105 kg)  03/31/15 170 lb (77.111 kg)  03/23/15 170 lb (77.111 kg)  03/12/15 170 lb 11.2 oz (77.429 kg)  11/02/14 180 lb (81.647 kg)     BMI:  Body mass index is 28.74 kg/(m^2).  Estimated Nutritional Needs:   Kcal:  BEE: 1478kcals, TEE: (IF 1.1-1.3)(AF 1.2) 1950-2305kcals  Protein:  80-96g protein (1.0-1.2g/kg)  Fluid:  2000-2449m of fluid  (25-328mkg)  EDUCATION NEEDS:   No education needs identified at this time  HIHanoverRD, LDN Pager ([phone]/On-Call Pager ([phone]

## 2015-06-03 NOTE — Progress Notes (Signed)
Subjective:  Patient known to us from prior admissions He was seen for acute renal failure last time. His discharge creatinine was 2.19/GFR 28 Admission creatinine is 3. 2 This creatinine is 3.06 Patient underwent large volume paracentesis today  Objective:  Vital signs in last 24 hours:  Temp:  [96.6 F (35.9 C)-98.2 F (36.8 C)] 97.7 F (36.5 C) (01/13 1600) Pulse Rate:  [71-86] 86 (01/13 1600) Resp:  [18-19] 18 (01/13 1600) BP: (84-119)/(51-64) 93/53 mmHg (01/13 1600) SpO2:  [94 %-98 %] 98 % (01/13 1600)  Weight change:  Filed Weights   06/02/15 1127  Weight: 80.74 kg (178 lb)    Intake/Output:    Intake/Output Summary (Last 24 hours) at 06/03/15 1634 Last data filed at 06/03/15 1300  Gross per 24 hour  Intake    120 ml  Output    220 ml  Net   -100 ml     Physical Exam: General: No acute distress, laying in the bed  HEENT Somewhat dry oral mucous membranes  Neck supple  Pulm/lungs Normal respiratory effort, clear to auscultation  CVS/Heart No rub or gallop  Abdomen:  Soft, mild tenderness  Extremities: No peripheral edema  Neurologic: alert  Skin: No acute rashes  Access:        Basic Metabolic Panel:   Recent Labs Lab 06/02/15 1146 06/03/15 0608  NA 130* 130*  K 5.2* 3.9  CL 100* 102  CO2 17* 18*  GLUCOSE 158* 167*  BUN 42* 39*  CREATININE 3.23* 3.06*  CALCIUM 8.2* 7.4*     CBC:  Recent Labs Lab 06/02/15 1146 06/03/15 0608  WBC 26.8* 20.1*  NEUTROABS 22.4*  --   HGB 12.3* 11.4*  HCT 38.8* 36.8*  MCV 71.0* 70.6*  PLT 254 165      Microbiology:  Recent Results (from the past 720 hour(s))  Body fluid culture     Status: None   Collection Time: 05/13/15 10:00 AM  Result Value Ref Range Status   Specimen Description PERITONEAL  Final   Special Requests NONE  Final   Gram Stain RARE WBC SEEN NO ORGANISMS SEEN   Final   Culture No growth aerobically or anaerobically.  Final   Report Status 05/17/2015 FINAL  Final   Culture, blood (routine x 2)     Status: None (Preliminary result)   Collection Time: 06/02/15 11:57 AM  Result Value Ref Range Status   Specimen Description BLOOD RIGHT ASSIST CONTROL  Final   Special Requests BOTTLES DRAWN AEROBIC AND ANAEROBIC 4CC  Final   Culture NO GROWTH < 24 HOURS  Final   Report Status PENDING  Incomplete  Culture, blood (routine x 2)     Status: None (Preliminary result)   Collection Time: 06/02/15 11:57 AM  Result Value Ref Range Status   Specimen Description BLOOD LEFT ASSIST CONTROL  Final   Special Requests   Final    BOTTLES DRAWN AEROBIC AND ANAEROBIC ANA 3CC AERO 1CC   Culture NO GROWTH < 24 HOURS  Final   Report Status PENDING  Incomplete    Coagulation Studies:  Recent Labs  06/02/15 1146  LABPROT 20.4*  INR 1.75    Urinalysis:  Recent Labs  06/03/15 1005  COLORURINE AMBER*  LABSPEC 1.024  PHURINE 5.0  GLUCOSEU NEGATIVE  HGBUR NEGATIVE  BILIRUBINUR NEGATIVE  KETONESUR NEGATIVE  PROTEINUR 30*  NITRITE NEGATIVE  LEUKOCYTESUR NEGATIVE      Imaging: Dg Chest 2 View  06/02/2015  CLINICAL DATA:  Acute hypotension  today. EXAM: CHEST  2 VIEW COMPARISON:  Single-view of the chest 05/11/2015. PA and lateral chest 04/26/2015. CT chest 02/21/2014. FINDINGS: Lung volumes are low with mild, chronic basilar atelectasis or scar, more notable on the left. Prominent epicardial fat on the left is noted. No pneumothorax or pleural effusion. Heart size is normal. The patient is status post CABG aortic valve replacement. IMPRESSION: No acute disease in a low volume chest. Electronically Signed   By: Drusilla Kanner M.D.   On: 06/02/2015 13:45   US Paracentesis  06/03/2015  INDICATION: Recurrent symptomatic ascites EXAM: ULTRASOUND-GUIDED PARACENTESIS COMPARISON:  Ultrasound-guided paracenteses - 05/17/2015; 05/13/2015 ; liver Doppler ultrasound - 05/16/2015 MEDICATIONS: None. COMPLICATIONS: None immediate TECHNIQUE: Informed written consent was  obtained from the patient after a discussion of the risks, benefits and alternatives to treatment. A timeout was performed prior to the initiation of the procedure. Initial ultrasound scanning demonstrates a moderate to large amount of ascites within the right lower abdominal quadrant. The right lower abdomen was prepped and draped in the usual sterile fashion. 1% lidocaine with epinephrine was used for local anesthesia. An ultrasound image was saved for documentation purposed. An 8 Fr Safe-T-Centesis catheter was introduced. The paracentesis was performed. The catheter was removed and a dressing was applied. The patient tolerated the procedure well without immediate post procedural complication. FINDINGS: A total of approximately 8.3 liters of serous fluid was removed. Samples were sent to the laboratory as requested by the clinical team. IMPRESSION: Successful ultrasound-guided paracentesis yielding 8.3 liters of peritoneal fluid. Electronically Signed   By: Simonne Come M.D.   On: 06/03/2015 14:20     Medications:     . aspirin  324 mg Oral Daily  . cefTRIAXone (ROCEPHIN)  IV  1 g Intravenous Q24H  . feeding supplement (ENSURE ENLIVE)  237 mL Oral BID BM  . gabapentin  300 mg Oral QHS  . heparin  5,000 Units Subcutaneous 3 times per day  . lactulose  30 g Oral BID  . midodrine  5 mg Oral TID WC  . pantoprazole  40 mg Oral QAC breakfast  . rosuvastatin  10 mg Oral BH-q7a   oxyCODONE  Assessment/ Plan:  75 y.o. male with a PMHX of dementia,atrial fibrillation, hypertension, GERD, newly discovered cirrhosis and ascites, cholelithiasis, aortic valve replacement, CABG, , was admitted on 05/11/2015 with ascites, worsening lower extremity edema.  1. Acute on chronic kidney disease stage III Baseline creatinine 2.19/GFR 28 Admission creatinine 3.2 today's creatinine 3.06 Acute renal failure is likely secondary to volume shifts related to ascites and cirrhosis - continue to monitor renal  function and electrolytes - continue midodrine  2. Cirrhosis with ascites Underwent paracentesis today 8.3 L of fluid was removed Will give IV albumin for oncotic support  3. Hyponatremia Likely secondary to cirrhosis Continue salt and fluid restriction   LOS: 1 Marie Chow 1/13/20174:34 PM

## 2015-06-03 NOTE — Procedures (Signed)
Successful US guided paracentesis yielding 8.3 L of serous ascitic fluid. Sample sent to laboratory as requested. No immediate post procedural complications.   Katherina RightJay Alaysha Jefcoat, MD Pager #: [phone]

## 2015-06-03 NOTE — Progress Notes (Signed)
[facility] - [place] at [facility]   PATIENT NAME: [name]    MR#:  [id number]  DATE OF BIRTH:  [birth year]  SUBJECTIVE:  CHIEF COMPLAINT:   Chief Complaint  Patient presents with  . Hypotension    BP stable, Have some baseline dementia.   S/p paracentesis 06/03/15- today- no complains.  REVIEW OF SYSTEMS:   Have baseline dementia- and not giving reliable ROS.  ROS  DRUG ALLERGIES:   Allergies  Allergen Reactions  . Adhesive [Tape] Rash    Redness     VITALS:  Blood pressure 95/54, pulse 80, temperature 97.9 F (36.6 C), temperature source Oral, resp. rate 18, height 5\' 6"  (1.676 m), weight 80.74 kg (178 lb), SpO2 97 %.  PHYSICAL EXAMINATION:   GENERAL: 75 y.o.-year-old patient lying in the bed with no acute distress.  EYES: Pupils equal, round, reactive to light and accommodation. No scleral icterus. Extraocular muscles intact.  HEENT: Head atraumatic, normocephalic. Oropharynx and nasopharynx clear.  NECK: Supple, no jugular venous distention. No thyroid enlargement, no tenderness.  LUNGS: Normal breath sounds bilaterally, no wheezing, rales,rhonchi or crepitation. No use of accessory muscles of respiration.  CARDIOVASCULAR: S1, S2 normal, irregular. No murmurs, rubs, or gallops.  ABDOMEN: Soft, mild right lower quadrant Tenderness, less distended today.  Bowel sounds present. No organomegaly or mass.  EXTREMITIES: No pedal edema, cyanosis, or clubbing.  NEUROLOGIC: Cranial nerves II through XII are intact. Muscle strength 5/5 in all extremities. Sensation intact. Gait not checked.  PSYCHIATRIC: The patient is alert and oriented to self.  SKIN: No obvious rash, lesion, or ulcer.    Physical Exam LABORATORY PANEL:   CBC  Recent Labs Lab 06/03/15 0608  WBC 20.1*  HGB 11.4*  HCT 36.8*  PLT 165    ------------------------------------------------------------------------------------------------------------------  Chemistries   Recent Labs Lab 06/02/15 1146 06/03/15 0608  NA 130* 130*  K 5.2* 3.9  CL 100* 102  CO2 17* 18*  GLUCOSE 158* 167*  BUN 42* 39*  CREATININE 3.23* 3.06*  CALCIUM 8.2* 7.4*  AST 84*  --   ALT 43  --   ALKPHOS 588*  --   BILITOT 2.1*  --    ------------------------------------------------------------------------------------------------------------------  Cardiac Enzymes No results for input(s): TROPONINI in the last 168 hours. ------------------------------------------------------------------------------------------------------------------  RADIOLOGY:  Dg Chest 2 View  06/02/2015  CLINICAL DATA:  Acute hypotension today. EXAM: CHEST  2 VIEW COMPARISON:  Single-view of the chest 05/11/2015. PA and lateral chest 04/26/2015. CT chest 02/21/2014. FINDINGS: Lung volumes are low with mild, chronic basilar atelectasis or scar, more notable on the left. Prominent epicardial fat on the left is noted. No pneumothorax or pleural effusion. Heart size is normal. The patient is status post CABG aortic valve replacement. IMPRESSION: No acute disease in a low volume chest. Electronically Signed   By: Drusilla Kanner M.D.   On: 06/02/2015 13:45    ASSESSMENT AND PLAN:   Principal Problem:   Hypotension Active Problems:   Ascites   Acute on chronic renal failure (HCC)   Hyperkalemia   Pressure ulcer  * Acute on chronic renal failure with hyperkalemia Most likely this is secondary to hypotension and use of diuretics.  hold his spironolactone and Lasix at this point. Monitor renal function - creatinin slightly better. Because of presence of ascites and liver failure I would not like to give IV fluids.  * Hyperkalemia Give Kayexalate oral. No EKG changes, came down.  * Ascites., Elevated white cell count-possible SBP s/p  therapeutic paracentesis  and send cell count and albumin from the sample. ER gave 1 dose of vancomycin and Zosyn, Changed toRocephin.  Cell count is low on Ascitic fluid- but this sample is collected after 2 doses of ABx.  As presented with hypotension and very high white cell count- will treat for now.  * Hypotension This and is secondary to combination of use of diuretics, and dehydration, and possible infection. Responded nicely to IV fluid, currently blood pressure is stable. Hold Diuretics.  * A. Fib Patient is not on any rate controlling medications but heart rate is under control. Not a candidate for beta blocker because of presence of hypotension. He was on Xarelto, I will hold it because of renal failure and requirement of paracentesis.   Due to liver failure- he is not a good candidate for anticoagulants.      All the records are reviewed and case discussed with Care Management/Social Workerr. Management plans discussed with the patient, family and they are in agreement.  CODE STATUS: FUll  TOTAL TIME TAKING CARE OF THIS PATIENT: 35 minutes.     POSSIBLE D/C IN 1-2 DAYS, DEPENDING ON CLINICAL CONDITION.   Altamese DillingVACHHANI, Kiyoshi Schaab M.D on 06/03/2015   Between 7am to 6pm - Pager - [phone]  After 6pm go to [email or web address] - password [email or web address] [facility]  [facility]  Office  [phone]  CC: Primary care physician; Corky DownsMASOUD, JAVED, MD  Note: This dictation was prepared with Dragon dictation along with smaller phrase technology. Any transcriptional errors that result from this process are unintentional.

## 2015-06-03 NOTE — Consult Note (Signed)
GI Inpatient Consult Note  Reason for Consult: Cirrhosis   Attending Requesting Consult: Dr. Elisabeth PigeonVachhani  History of Present Illness: Matthew LeydenJoe Blanchard is a 75 y.o. male has a significant pmh of a fib, CHF.  He has baseline confusion and no family members were present, history was obtained from medical records.  He was recently [facility] at the end of December 2016 with chief complaint of increase in abdominal girth over the previous month, SOB.  He had an upper endoscopy prior to admission with Dr. Servando SnareWohl that showed small HH, gastritis, no varices, negative h.pylori. His WBC on that admission was 17.7  Cirrhosis was determined from imaging, no known history of alcohol use. He was on both coumadin and plavix. He had taken amiodarone.   Recommended lactulose, aldactone/lasix on discharge. Discharged to [facility].   Past Medical History:  Past Medical History  Diagnosis Date  . A-fib (HCC)   . Dementia   . Hypertension   . High cholesterol   . GERD (gastroesophageal reflux disease)   . Acid reflux   . CHF (congestive heart failure) (HCC)   . Liver cirrhosis (HCC)   . Chronic kidney disease     Problem List: Patient Active Problem List   Diagnosis Date Noted  . Pressure ulcer 06/03/2015  . Hypotension 06/02/2015  . Acute on chronic renal failure (HCC) 06/02/2015  . Hyperkalemia 06/02/2015  . Portal hypertension (HCC)   . Hepatic cirrhosis (HCC)   . Ascites 05/11/2015  . Gastritis and gastroduodenitis   . Gastroesophageal reflux disease without esophagitis   . Chest pain 03/12/2015    Past Surgical History: Past Surgical History  Procedure Laterality Date  . Aortic valve replacement    . Cardiac bypass     . Coronary artery bypass graft    . Esophagogastroduodenoscopy (egd) with propofol N/A 05/03/2015    Procedure: ESOPHAGOGASTRODUODENOSCOPY (EGD) WITH PROPOFOL;  Surgeon: Midge Miniumarren Wohl, MD;  Location: [facility] ENDOSCOPY;  Service: Endoscopy;  Laterality: N/A;  . Cardiac  valve replacement    . Mouth surgery      removed all teeth     Allergies: Allergies  Allergen Reactions  . Adhesive [Tape] Rash    Redness     Home Medications: Prescriptions prior to admission  Medication Sig Dispense Refill Last Dose  . alum & mag hydroxide-simeth (MAALOX/MYLANTA) 200-200-20 MG/5ML suspension Take 15 mLs by mouth as needed for indigestion or heartburn.    prn  . aspirin (ASPIRIN CHILDRENS) 81 MG chewable tablet Chew 4 tablets (324 mg total) by mouth daily. 120 tablet 0 06/02/2015 at Unknown time  . furosemide (LASIX) 20 MG tablet Take 1 tablet (20 mg total) by mouth daily. 30 tablet 0 06/02/2015 at Unknown time  . gabapentin (NEURONTIN) 300 MG capsule Take 300 mg by mouth at bedtime.   06/01/2015 at Unknown time  . isosorbide dinitrate (ISORDIL) 20 MG tablet Take 20 mg by mouth daily.   06/02/2015 at Unknown time  . lactulose (CHRONULAC) 10 GM/15ML solution Take 45 mLs (30 g total) by mouth 2 (two) times daily. 240 mL 0 06/01/2015 at Unknown time  . midodrine (PROAMATINE) 5 MG tablet Take 1 tablet (5 mg total) by mouth 3 (three) times daily with meals. (Patient taking differently: Take 5 mg by mouth 2 (two) times daily with a meal. ) 90 tablet 0 06/02/2015 at Unknown time  . pantoprazole (PROTONIX) 40 MG tablet Take 40 mg by mouth 2 (two) times daily.   06/02/2015 at Unknown time  .  rosuvastatin (CRESTOR) 10 MG tablet Take 10 mg by mouth every morning.    06/02/2015 at Unknown time  . spironolactone (ALDACTONE) 25 MG tablet Take 1 tablet (25 mg total) by mouth daily. (Patient taking differently: Take 50 mg by mouth daily. ) 30 tablet 0 06/02/2015 at Unknown time  . warfarin (COUMADIN) 1 MG tablet Take 1 mg by mouth at bedtime.   06/01/2015 at Unknown time   Home medication reconciliation was completed with the patient.   Scheduled Inpatient Medications:   . aspirin  324 mg Oral Daily  . cefTRIAXone (ROCEPHIN)  IV  1 g Intravenous Q24H  . feeding supplement (ENSURE ENLIVE)   237 mL Oral BID BM  . heparin  5,000 Units Subcutaneous 3 times per day  . lactulose  30 g Oral BID  . midodrine  5 mg Oral TID WC  . pantoprazole  40 mg Oral QAC breakfast  . rosuvastatin  10 mg Oral BH-q7a    Continuous Inpatient Infusions:     PRN Inpatient Medications:    Family History: family history includes CAD in his father; Cancer in his mother; Hypertension in his father.    Social History:   reports that he has never smoked. He quit smokeless tobacco use about 2 months ago. His smokeless tobacco use included Snuff and Chew. He reports that he does not drink alcohol or use illicit drugs.    Review of Systems: Constitutional: Weight is stable.  Eyes: No changes in vision. ENT: No oral lesions, sore throat.  GI: see HPI.  Heme/Lymph: No easy bruising.  CV: No chest pain.  GU: No hematuria.  Integumentary: No rashes.  Neuro: No headaches.  Psych: No depression/anxiety.  Endocrine: No heat/cold intolerance.  Allergic/Immunologic: No urticaria.  Resp: No cough, SOB.  Musculoskeletal: No joint swelling.    Physical Examination: BP 95/54 mmHg  Pulse 80  Temp(Src) 97.9 F (36.6 C) (Oral)  Resp 18  Ht 5\' 6"  (1.676 m)  Wt 80.74 kg (178 lb)  BMI 28.74 kg/m2  SpO2 97% Gen: NAD, pleasantly confused, poor historian HEENT: PEERLA, EOMI, Neck: supple, no JVD or thyromegaly Chest: CTA bilaterally, no wheezes, crackles, or other adventitious sounds CV: RRR, no m/g/c/r Abd: soft, generalized abdominal tenderness, ND, +BS in all four quadrants; no HSM, guarding, ridigity, or rebound tenderness Ext: no edema, well perfused with 2+ pulses, Skin: no rash or lesions noted Lymph: no LAD  Data: Lab Results  Component Value Date   WBC 20.1* 06/03/2015   HGB 11.4* 06/03/2015   HCT 36.8* 06/03/2015   MCV 70.6* 06/03/2015   PLT 165 06/03/2015    Recent Labs Lab 06/02/15 1146 06/03/15 0608  HGB 12.3* 11.4*   Lab Results  Component Value Date   NA 130* 06/03/2015    K 3.9 06/03/2015   CL 102 06/03/2015   CO2 18* 06/03/2015   BUN 39* 06/03/2015   CREATININE 3.06* 06/03/2015   Lab Results  Component Value Date   ALT 43 06/02/2015   AST 84* 06/02/2015   ALKPHOS 588* 06/02/2015   BILITOT 2.1* 06/02/2015    Recent Labs Lab 06/02/15 1146  INR 1.75    Imaging:  INDICATION: Recurrent symptomatic ascites  EXAM: ULTRASOUND-GUIDED PARACENTESIS  COMPARISON: Ultrasound-guided paracenteses - 05/17/2015; 05/13/2015 ; liver Doppler ultrasound - 05/16/2015  MEDICATIONS: None.  COMPLICATIONS: None immediate  TECHNIQUE: Informed written consent was obtained from the patient after a discussion of the risks, benefits and alternatives to treatment. A timeout was performed prior to  the initiation of the procedure.  Initial ultrasound scanning demonstrates a moderate to large amount of ascites within the right lower abdominal quadrant. The right lower abdomen was prepped and draped in the usual sterile fashion. 1% lidocaine with epinephrine was used for local anesthesia. An ultrasound image was saved for documentation purposed. An 8 Fr Safe-T-Centesis catheter was introduced. The paracentesis was performed. The catheter was removed and a dressing was applied. The patient tolerated the procedure well without immediate post procedural complication.  FINDINGS: A total of approximately 8.3 liters of serous fluid was removed. Samples were sent to the laboratory as requested by the clinical team.  IMPRESSION: Successful ultrasound-guided paracentesis yielding 8.3 liters of peritoneal fluid.   Electronically Signed  By: Simonne Come M.D.  On: 06/03/2015 14:20  Assessment/Plan: Mr. Matters is a 75 y.o. male with recurrent ascites, cirrhosis on unknown etiology, still awaiting body fluid sample results.  WBC on admission 20.1, hgb 11.4, MCV 70.6, ammonia 26.  Being treated with lactulose BID, Protonix, Rocephin.     Recommendations: We agree with lactulose, recommend keeping track of number of bowel movements daily.  We agree with continuing to watch WBC.  We will continue following with you.  Thank you for the consult. Please call with questions or concerns.  Carney Harder, PA-C  I personally performed these services.

## 2015-06-03 NOTE — Care Management (Signed)
Spoke with NeurosurgeonAndy social worker at [facility]. Patient recently discharged from their facility [facility]. Per Mardelle MatteAndy patient's granddaughter was glad to take patient home and said "he his baseline confused". [facility].

## 2015-06-03 NOTE — Consult Note (Signed)
Patient had several liters removed form abdomen. Feeling better, is hungry.  Cirrhosis of uncertain etiology, possible amiodarone is the cause.  He denies any alcohol intake.  No recommendations at this time.  Will follow.

## 2015-06-03 NOTE — Plan of Care (Signed)
Problem: Pain Managment: Goal: General experience of comfort will improve Outcome: Progressing Had 8.25liters removed from abdomen.  Problem: Physical Regulation: Goal: Ability to maintain clinical measurements within normal limits will improve Outcome: Progressing Blood pressure remaining stable  Problem: Fluid Volume: Goal: Ability to maintain a balanced intake and output will improve Outcome: Progressing Had 8.25 liters removed from abdomen.

## 2015-06-03 NOTE — Evaluation (Signed)
Physical Therapy Evaluation Patient Details Name: [name] MRN: [id number] DOB: Dec 23, 1940 Today's Date: 06/03/2015   History of Present Illness  Patient is a 75 y/o male that presents after feeling dizzy and experiencing symptoms of low BP. Noted to have 8L drained off in paracentesis. PMH: a-fib, CHF. new onset liver cirrhosis.   Clinical Impression  Patient was recently seen by this therapist and able to ambulate household distances with 4WW. During this session however he demonstrates decreased strength with bed mobility, requiring heavy use of UEs to bring himself upright and fatigues quickly with ambulation (asks to return to bed after roughly 15'). While he did not demonstrate any loss of balance, he demonstrates poor ambulation tolerance and narrow BOS during ambulation. Patient lives at home alone, and is not able to manage in his current physical condition. Would recommend STR upon discharge to facilitate safe return home.     Follow Up Recommendations SNF    Equipment Recommendations       Recommendations for Other Services       Precautions / Restrictions Precautions Precautions: Fall Restrictions Weight Bearing Restrictions: No      Mobility  Bed Mobility Overal bed mobility: Needs Assistance Bed Mobility: Supine to Sit     Supine to sit: Min guard     General bed mobility comments: Patient requires heavy use of UEs to assist with transfer, but no assistance from PT. Prolonged time to complete.   Transfers Overall transfer level: Needs assistance Equipment used: Rolling walker (2 wheeled) Transfers: Sit to/from Stand Sit to Stand: Min guard         General transfer comment: Appropriate use of hand rails and RW, no loss of balance and appropriate speed noted. Heavy use of UEs noted.   Ambulation/Gait Ambulation/Gait assistance: Supervision Ambulation Distance (Feet): 30 Feet (30' x 2) Assistive device: Rolling walker (2 wheeled) Gait Pattern/deviations:  WFL(Within Functional Limits);Narrow base of support   Gait velocity interpretation: Below normal speed for age/gender General Gait Details: Patient demonstrates reciprocal gait pattern, however narrow BOS noted. No loss of balance observed, however patient fatigued quite quickly.   Stairs            Wheelchair Mobility    Modified Rankin (Stroke Patients Only)       Balance Overall balance assessment: Needs assistance Sitting-balance support: No upper extremity supported Sitting balance-Leahy Scale: Good Sitting balance - Comments: No balance deficits.      Standing balance-Leahy Scale: Fair Standing balance comment: Narrow BOS during ambulation, no loss of balance noted though no formal outcome measure provided.                              Pertinent Vitals/Pain Pain Assessment:  (Reports he has abdominal pain, RN provided pain medications in session.)    Home Living Family/patient expects to be discharged to:: Skilled nursing facility Living Arrangements: Alone Available Help at Discharge: Available PRN/intermittently;Family Type of Home: Apartment       Home Layout: One level Home Equipment: Walker - 4 wheels      Prior Function Level of Independence: Independent with assistive device(s)         Comments: Patient reports he has had "a few" falls in the last 12 months.      Hand Dominance        Extremity/Trunk Assessment   Upper Extremity Assessment: Overall WFL for tasks assessed  Lower Extremity Assessment: Overall WFL for tasks assessed         Communication   Communication: No difficulties  Cognition Arousal/Alertness: Awake/alert Behavior During Therapy: WFL for tasks assessed/performed Overall Cognitive Status: History of cognitive impairments - at baseline (Seems slightly more confused than last time this author met this patient. )                      General Comments      Exercises         Assessment/Plan    PT Assessment Patient needs continued PT services  PT Diagnosis Difficulty walking;Generalized weakness   PT Problem List Decreased strength;Decreased knowledge of use of DME;Decreased balance;Decreased mobility;Cardiopulmonary status limiting activity  PT Treatment Interventions DME instruction;Gait training;Stair training;Therapeutic activities;Therapeutic exercise;Balance training   PT Goals (Current goals can be found in the Care Plan section) Acute Rehab PT Goals Patient Stated Goal: To return home safely  PT Goal Formulation: With patient Time For Goal Achievement: 06/17/15 Potential to Achieve Goals: Good    Frequency Min 2X/week   Barriers to discharge Decreased caregiver support Patient lives alone     Co-evaluation               End of Session Equipment Utilized During Treatment: Gait belt Activity Tolerance: Patient tolerated treatment well;Patient limited by fatigue Patient left: in chair;with call bell/phone within reach;with chair alarm set Nurse Communication: Mobility status         Time: 2446-9507 PT Time Calculation (min) (ACUTE ONLY): 14 min   Charges:   PT Evaluation $PT Eval Moderate Complexity: 1 Procedure     PT G Codes:       Kerman Passey, PT, DPT    06/03/2015, 6:23 PM

## 2015-06-04 DIAGNOSIS — E43 Unspecified severe protein-calorie malnutrition: Secondary | ICD-10-CM | POA: Insufficient documentation

## 2015-06-04 LAB — BASIC METABOLIC PANEL
Anion gap: 8 (ref 5–15)
BUN: 44 mg/dL — AB (ref 6–20)
CALCIUM: 7.6 mg/dL — AB (ref 8.9–10.3)
CHLORIDE: 103 mmol/L (ref 101–111)
CO2: 22 mmol/L (ref 22–32)
CREATININE: 2.91 mg/dL — AB (ref 0.61–1.24)
GFR calc Af Amer: 23 mL/min — ABNORMAL LOW (ref >60)
GFR calc non Af Amer: 20 mL/min — ABNORMAL LOW (ref >60)
Glucose, Bld: 137 mg/dL — ABNORMAL HIGH (ref 65–99)
Potassium: 4.2 mmol/L (ref 3.5–5.1)
SODIUM: 133 mmol/L — AB (ref 135–145)

## 2015-06-04 LAB — CBC
HCT: 36 % — ABNORMAL LOW (ref 40.0–52.0)
HEMOGLOBIN: 11.2 g/dL — AB (ref 13.0–18.0)
MCH: 21.8 pg — AB (ref 26.0–34.0)
MCHC: 31.2 g/dL — AB (ref 32.0–36.0)
MCV: 70.1 fL — ABNORMAL LOW (ref 80.0–100.0)
Platelets: 146 10*3/uL — ABNORMAL LOW (ref 150–440)
RBC: 5.14 MIL/uL (ref 4.40–5.90)
RDW: 20.1 % — AB (ref 11.5–14.5)
WBC: 19 10*3/uL — ABNORMAL HIGH (ref 3.8–10.6)

## 2015-06-04 LAB — MRSA PCR SCREENING: MRSA BY PCR: NEGATIVE

## 2015-06-04 LAB — GLUCOSE, CAPILLARY: Glucose-Capillary: 101 mg/dL — ABNORMAL HIGH (ref 65–99)

## 2015-06-04 MED ORDER — SODIUM CHLORIDE 0.9 % IV SOLN
INTRAVENOUS | Status: DC
  Administered 2015-06-04: 06:00:00 via INTRAVENOUS

## 2015-06-04 MED ORDER — MIDODRINE HCL 5 MG PO TABS
10.0000 mg | ORAL_TABLET | Freq: Three times a day (TID) | ORAL | Status: DC
  Administered 2015-06-04 – 2015-06-08 (×14): 10 mg via ORAL
  Filled 2015-06-04 (×14): qty 2

## 2015-06-04 MED ORDER — SODIUM CHLORIDE 0.9 % IV SOLN
INTRAVENOUS | Status: DC
  Administered 2015-06-04: 08:00:00 via INTRAVENOUS

## 2015-06-04 NOTE — Significant Event (Signed)
Rapid Response Event Note  Overview: Time Called: 0737 Arrival Time: 0739 Event Type: Hypotension  Initial Focused Assessment: pt hypotensive/ decreased uo after pericentesis previous day   Interventions:contacted Dr/ Wieting/ sent to icu 9 as stepdown   Event Summary: Name of Physician Notified: dr Greggory Brandyvacchanni at  (no responsex2)  Name of Consulting Physician Notified: dr Renae Glosswieting at [phone]  Outcome: Transferred (Comment) (transferred to [facility]. pt is a full code)     [name] A

## 2015-06-04 NOTE — Progress Notes (Signed)
Subjective:  Patient known to [place] from prior admissions. He was seen for acute renal failure last time. His discharge creatinine was 2.19/GFR 28 Admission creatinine is 3.2 Patient underwent large volume paracentesis on January 13 where 8.3 L of fluid was removed After the procedure, patient was started on IV albumin However, he became hypotensive and the rapid response was called therefore he was transferred to the ICU At present, he appears to be at his baseline Blood pressure is in the 90s Today's creatinine is 2.91   Objective:  Vital signs in last 24 hours:  Temp:  [97.6 F (36.4 C)-98 F (36.7 C)] 97.7 F (36.5 C) (01/14 0727) Pulse Rate:  [77-86] 77 (01/14 0800) Resp:  [13-18] 13 (01/14 0800) BP: (73-95)/(46-60) 92/51 mmHg (01/14 0800) SpO2:  [95 %-99 %] 99 % (01/14 0800) Weight:  [69.899 kg (154 lb 1.6 oz)-71.079 kg (156 lb 11.2 oz)] 71.079 kg (156 lb 11.2 oz) (01/14 0459)  Weight change: -10.841 kg (-23 lb 14.4 oz) Filed Weights   06/02/15 1127 06/03/15 1637 06/04/15 0459  Weight: 80.74 kg (178 lb) 69.899 kg (154 lb 1.6 oz) 71.079 kg (156 lb 11.2 oz)    Intake/Output:    Intake/Output Summary (Last 24 hours) at 06/04/15 1239 Last data filed at 06/04/15 0900  Gross per 24 hour  Intake    360 ml  Output    325 ml  Net     35 ml     Physical Exam: General: No acute distress, laying in the bed  HEENT Somewhat dry oral mucous membranes  Neck supple  Pulm/lungs Normal respiratory effort, clear to auscultation  CVS/Heart No rub or gallop  Abdomen:  Soft, mild tenderness, mild distention from ascites   Extremities: No peripheral edema  Neurologic: alert  Skin: No acute rashes  Access:        Basic Metabolic Panel:   Recent Labs Lab 06/02/15 1146 06/03/15 0608 06/04/15 0349  NA 130* 130* 133*  K 5.2* 3.9 4.2  CL 100* 102 103  CO2 17* 18* 22  GLUCOSE 158* 167* 137*  BUN 42* 39* 44*  CREATININE 3.23* 3.06* 2.91*  CALCIUM 8.2* 7.4* 7.6*      CBC:  Recent Labs Lab 06/02/15 1146 06/03/15 0608 06/04/15 0349  WBC 26.8* 20.1* 19.0*  NEUTROABS 22.4*  --   --   HGB 12.3* 11.4* 11.2*  HCT 38.8* 36.8* 36.0*  MCV 71.0* 70.6* 70.1*  PLT 254 165 146*      Microbiology:  Recent Results (from the past 720 hour(s))  Body fluid culture     Status: None   Collection Time: 05/13/15 10:00 AM  Result Value Ref Range Status   Specimen Description PERITONEAL  Final   Special Requests NONE  Final   Gram Stain RARE WBC SEEN NO ORGANISMS SEEN   Final   Culture No growth aerobically or anaerobically.  Final   Report Status 05/17/2015 FINAL  Final  Culture, blood (routine x 2)     Status: None (Preliminary result)   Collection Time: 06/02/15 11:57 AM  Result Value Ref Range Status   Specimen Description BLOOD RIGHT ASSIST CONTROL  Final   Special Requests BOTTLES DRAWN AEROBIC AND ANAEROBIC 4CC  Final   Culture NO GROWTH < 24 HOURS  Final   Report Status PENDING  Incomplete  Culture, blood (routine x 2)     Status: None (Preliminary result)   Collection Time: 06/02/15 11:57 AM  Result Value Ref Range Status  Specimen Description BLOOD LEFT ASSIST CONTROL  Final   Special Requests   Final    BOTTLES DRAWN AEROBIC AND ANAEROBIC ANA 3CC AERO 1CC   Culture NO GROWTH < 24 HOURS  Final   Report Status PENDING  Incomplete  Urine culture     Status: None (Preliminary result)   Collection Time: 06/03/15 10:05 AM  Result Value Ref Range Status   Specimen Description URINE, RANDOM  Final   Special Requests NONE  Final   Culture TOO YOUNG TO READ  Final   Report Status PENDING  Incomplete  Body fluid culture     Status: None (Preliminary result)   Collection Time: 06/03/15 11:01 AM  Result Value Ref Range Status   Specimen Description CYTO PERI  Final   Special Requests NONE  Final   Gram Stain PENDING  Incomplete   Culture NO GROWTH < 24 HOURS  Final   Report Status PENDING  Incomplete  MRSA PCR Screening     Status: None    Collection Time: 06/04/15  7:53 AM  Result Value Ref Range Status   MRSA by PCR NEGATIVE NEGATIVE Final    Comment:        The GeneXpert MRSA Assay (FDA approved for NASAL specimens only), is one component of a comprehensive MRSA colonization surveillance program. It is not intended to diagnose MRSA infection nor to guide or monitor treatment for MRSA infections.     Coagulation Studies:  Recent Labs  06/02/15 1146  LABPROT 20.4*  INR 1.75    Urinalysis:  Recent Labs  06/03/15 1005  COLORURINE AMBER*  LABSPEC 1.024  PHURINE 5.0  GLUCOSEU NEGATIVE  HGBUR NEGATIVE  BILIRUBINUR NEGATIVE  KETONESUR NEGATIVE  PROTEINUR 30*  NITRITE NEGATIVE  LEUKOCYTESUR NEGATIVE      Imaging: Dg Chest 2 View  06/02/2015  CLINICAL DATA:  Acute hypotension today. EXAM: CHEST  2 VIEW COMPARISON:  Single-view of the chest 05/11/2015. PA and lateral chest 04/26/2015. CT chest 02/21/2014. FINDINGS: Lung volumes are low with mild, chronic basilar atelectasis or scar, more notable on the left. Prominent epicardial fat on the left is noted. No pneumothorax or pleural effusion. Heart size is normal. The patient is status post CABG aortic valve replacement. IMPRESSION: No acute disease in a low volume chest. Electronically Signed   By: Drusilla Kannerhomas  Dalessio M.D.   On: 06/02/2015 13:45   [place] Paracentesis  06/03/2015  INDICATION: Recurrent symptomatic ascites EXAM: ULTRASOUND-GUIDED PARACENTESIS COMPARISON:  Ultrasound-guided paracenteses - 05/17/2015; 05/13/2015 ; liver Doppler ultrasound - 05/16/2015 MEDICATIONS: None. COMPLICATIONS: None immediate TECHNIQUE: Informed written consent was obtained from the patient after a discussion of the risks, benefits and alternatives to treatment. A timeout was performed prior to the initiation of the procedure. Initial ultrasound scanning demonstrates a moderate to large amount of ascites within the right lower abdominal quadrant. The right lower abdomen was  prepped and draped in the usual sterile fashion. 1% lidocaine with epinephrine was used for local anesthesia. An ultrasound image was saved for documentation purposed. An 8 Fr Safe-T-Centesis catheter was introduced. The paracentesis was performed. The catheter was removed and a dressing was applied. The patient tolerated the procedure well without immediate post procedural complication. FINDINGS: A total of approximately 8.3 liters of serous fluid was removed. Samples were sent to the laboratory as requested by the clinical team. IMPRESSION: Successful ultrasound-guided paracentesis yielding 8.3 liters of peritoneal fluid. Electronically Signed   By: Simonne ComeJohn  Watts M.D.   On: 06/03/2015 14:20  Medications:   . sodium chloride Stopped (06/04/15 0825)  . sodium chloride 40 mL/hr at 06/04/15 0825   . albumin human  12.5 g Intravenous Daily  . aspirin  324 mg Oral Daily  . cefTRIAXone (ROCEPHIN)  IV  1 g Intravenous Q24H  . feeding supplement (ENSURE ENLIVE)  237 mL Oral BID BM  . gabapentin  300 mg Oral QHS  . heparin  5,000 Units Subcutaneous 3 times per day  . lactulose  30 g Oral BID  . midodrine  10 mg Oral TID WC  . pantoprazole  40 mg Oral QAC breakfast  . rosuvastatin  10 mg Oral BH-q7a   oxyCODONE  Assessment/ Plan:  75 y.o. male with a PMHX of dementia,atrial fibrillation, hypertension, GERD, newly discovered cirrhosis and ascites, cholelithiasis, aortic valve replacement, CABG, , was admitted on 05/11/2015 with ascites, worsening lower extremity edema.  1. Acute on chronic kidney disease stage III Baseline creatinine 2.19/GFR 28 Admission creatinine 3.2 today's creatinine 2.91 Acute renal failure is likely secondary to volume shifts related to ascites and cirrhosis - continue to monitor renal function and electrolytes - continue midodrine and iv albumin  2. Cirrhosis with ascites Underwent paracentesis 1/13 8.3 L of fluid was removed continue IV albumin for oncotic  support  3. Hyponatremia Likely secondary to cirrhosis Continue salt and fluid restriction   LOS: 2 Marigene Erler 1/14/201712:39 PM

## 2015-06-04 NOTE — Plan of Care (Signed)
Problem: Pain Managment: Goal: General experience of comfort will improve Outcome: Progressing Pt remains pain free.

## 2015-06-04 NOTE — Progress Notes (Signed)
Patient ID: [name], male   DOB: 1940/06/03, 75 y.o.   MRN: [id number] [facility] PROGRESS NOTE  PCP: Corky Downs, MD  HPI/Subjective: Called to rapid response for lethargy and hypotension. Patient awakened and answered questions. Only complaint was slight upper abdominal pain  Objective: Filed Vitals:   06/04/15 0728 06/04/15 0742  BP: 84/50 85/46  Pulse:    Temp:    Resp: 14     Filed Weights   06/02/15 1127 06/03/15 1637 06/04/15 0459  Weight: 80.74 kg (178 lb) 69.899 kg (154 lb 1.6 oz) 71.079 kg (156 lb 11.2 oz)    ROS: Review of Systems  Constitutional: Negative for fever and chills.  Eyes: Negative for blurred vision.  Respiratory: Negative for cough and shortness of breath.   Cardiovascular: Negative for chest pain.  Gastrointestinal: Positive for abdominal pain. Negative for nausea, vomiting, diarrhea and constipation.  Genitourinary: Negative for dysuria.  Musculoskeletal: Negative for joint pain.  Neurological: Negative for dizziness and headaches.   Exam: Physical Exam  HENT:  Nose: No mucosal edema.  Mouth/Throat: No oropharyngeal exudate or posterior oropharyngeal edema.  Eyes: Conjunctivae, EOM and lids are normal. Pupils are equal, round, and reactive to light.  Neck: No JVD present. Carotid bruit is not present. No edema present. No thyroid mass and no thyromegaly present.  Cardiovascular: Regular rhythm, S1 normal and S2 normal.  Exam reveals no gallop.   Murmur heard.  Systolic murmur is present with a grade of 2/6  Pulses:      Dorsalis pedis pulses are 2+ on the right side, and 2+ on the left side.  Respiratory: No respiratory distress. He has no wheezes. He has rhonchi in the right lower field and the left lower field. He has no rales.  GI: Soft. Bowel sounds are normal. He exhibits distension. There is tenderness in the epigastric area.  Musculoskeletal:       Right ankle: He exhibits no swelling.       Left ankle: He exhibits no  swelling.  Lymphadenopathy:    He has no cervical adenopathy.  Neurological: He is alert.  Skin: Skin is warm. Nails show no clubbing.  Bruising on extremities  Psychiatric: He has a normal mood and affect.      Data Reviewed: Basic Metabolic Panel:  Recent Labs Lab 06/02/15 1146 06/03/15 0608 06/04/15 0349  NA 130* 130* 133*  K 5.2* 3.9 4.2  CL 100* 102 103  CO2 17* 18* 22  GLUCOSE 158* 167* 137*  BUN 42* 39* 44*  CREATININE 3.23* 3.06* 2.91*  CALCIUM 8.2* 7.4* 7.6*   Liver Function Tests:  Recent Labs Lab 06/02/15 1146  AST 84*  ALT 43  ALKPHOS 588*  BILITOT 2.1*  PROT 5.8*  ALBUMIN 2.5*    Recent Labs Lab 06/02/15 1146  LIPASE 34    Recent Labs Lab 06/02/15 1156  AMMONIA 26   CBC:  Recent Labs Lab 06/02/15 1146 06/03/15 0608 06/04/15 0349  WBC 26.8* 20.1* 19.0*  NEUTROABS 22.4*  --   --   HGB 12.3* 11.4* 11.2*  HCT 38.8* 36.8* 36.0*  MCV 71.0* 70.6* 70.1*  PLT 254 165 146*     Recent Results (from the past 240 hour(s))  Culture, blood (routine x 2)     Status: None (Preliminary result)   Collection Time: 06/02/15 11:57 AM  Result Value Ref Range Status   Specimen Description BLOOD RIGHT ASSIST CONTROL  Final   Special Requests BOTTLES DRAWN AEROBIC AND ANAEROBIC  4CC  Final   Culture NO GROWTH < 24 HOURS  Final   Report Status PENDING  Incomplete  Culture, blood (routine x 2)     Status: None (Preliminary result)   Collection Time: 06/02/15 11:57 AM  Result Value Ref Range Status   Specimen Description BLOOD LEFT ASSIST CONTROL  Final   Special Requests   Final    BOTTLES DRAWN AEROBIC AND ANAEROBIC ANA 3CC AERO 1CC   Culture NO GROWTH < 24 HOURS  Final   Report Status PENDING  Incomplete     Studies: Dg Chest 2 View  06/02/2015  CLINICAL DATA:  Acute hypotension today. EXAM: CHEST  2 VIEW COMPARISON:  Single-view of the chest 05/11/2015. PA and lateral chest 04/26/2015. CT chest 02/21/2014. FINDINGS: Lung volumes are low with  mild, chronic basilar atelectasis or scar, more notable on the left. Prominent epicardial fat on the left is noted. No pneumothorax or pleural effusion. Heart size is normal. The patient is status post CABG aortic valve replacement. IMPRESSION: No acute disease in a low volume chest. Electronically Signed   By: Drusilla Kanner M.D.   On: 06/02/2015 13:45   [place] Paracentesis  06/03/2015  INDICATION: Recurrent symptomatic ascites EXAM: ULTRASOUND-GUIDED PARACENTESIS COMPARISON:  Ultrasound-guided paracenteses - 05/17/2015; 05/13/2015 ; liver Doppler ultrasound - 05/16/2015 MEDICATIONS: None. COMPLICATIONS: None immediate TECHNIQUE: Informed written consent was obtained from the patient after a discussion of the risks, benefits and alternatives to treatment. A timeout was performed prior to the initiation of the procedure. Initial ultrasound scanning demonstrates a moderate to large amount of ascites within the right lower abdominal quadrant. The right lower abdomen was prepped and draped in the usual sterile fashion. 1% lidocaine with epinephrine was used for local anesthesia. An ultrasound image was saved for documentation purposed. An 8 Fr Safe-T-Centesis catheter was introduced. The paracentesis was performed. The catheter was removed and a dressing was applied. The patient tolerated the procedure well without immediate post procedural complication. FINDINGS: A total of approximately 8.3 liters of serous fluid was removed. Samples were sent to the laboratory as requested by the clinical team. IMPRESSION: Successful ultrasound-guided paracentesis yielding 8.3 liters of peritoneal fluid. Electronically Signed   By: Simonne Come M.D.   On: 06/03/2015 14:20    Scheduled Meds: . albumin human  12.5 g Intravenous Daily  . aspirin  324 mg Oral Daily  . cefTRIAXone (ROCEPHIN)  IV  1 g Intravenous Q24H  . feeding supplement (ENSURE ENLIVE)  237 mL Oral BID BM  . gabapentin  300 mg Oral QHS  . heparin  5,000 Units  Subcutaneous 3 times per day  . lactulose  30 g Oral BID  . midodrine  10 mg Oral TID WC  . pantoprazole  40 mg Oral QAC breakfast  . rosuvastatin  10 mg Oral BH-q7a   Continuous Infusions: . sodium chloride 100 mL/hr at 06/04/15 0531    Assessment/Plan:  1. Hypotension and lethargy- patient transferred to icu.  Fluid bolus, iv albumin and increase midodrine to 10mg  po tid. 2. Cirrhosis s/p large volume paracentesis.  Empiric rocephin in case of sbp 3. Hx afib 4. Hyperlipidemia unspecified- crestor 5. gerd- protonix 6. ckd stage 4  Code Status:     Code Status Orders        Start     Ordered   06/02/15 1803  Full code   Continuous     06/02/15 1802    Code Status History    Date Active Date  Inactive Code Status Order ID Comments User Context   05/11/2015  3:32 PM 05/17/2015  9:41 PM Full Code [id number]  Adrian SaranSital Mody, MD Inpatient   03/12/2015 12:58 PM 03/13/2015  3:32 PM Full Code [id number]  Enid Baasadhika Kalisetti, MD Inpatient    Advance Directive Documentation        Most Recent Value   Type of Advance Directive  Healthcare Power of Attorney   Pre-existing out of facility DNR order (yellow form or pink MOST form)     "MOST" Form in Place?       Family Communication: left message for granddaughter Disposition Plan: tbd  Antibiotics:  rocephin  Time spent: 20minutes  Alford HighlandWIETING, Theophil Thivierge  [facility]

## 2015-06-04 NOTE — Progress Notes (Signed)
Pt arrived Rapid Response from 1A. Pt being sent to unit for Hypotension.  Pt arrived to Unit with MAP of 67.  Pt historically hypotensive, Dr. Renae GlossWieting aware and at bedside. Floor nurse reports pt has had no Urinary output since 4am.  They report bladder scan shows 180ml urine indwelling.  Foley placed by Cheral BayKatie Clayton RN, with 250ml out initially. Clear Amber urine.  NS Bolus Continuing from Floor.  Pt alert but disoriented which is his baseline.  Abd soft, minimally tender.  No further interventions at this time.  Pt admitted Stepdown status.

## 2015-06-04 NOTE — Progress Notes (Signed)
Dr. Sheryle Hailiamond notified of pt blood pressure, lethargy and output. Orders placed for maintaince fluid and continue to monitor.

## 2015-06-04 NOTE — Plan of Care (Signed)
Problem: Bowel/Gastric: Goal: Will not experience complications related to bowel motility Outcome: Progressing Pt had soft BM.

## 2015-06-04 NOTE — Progress Notes (Signed)
Rapid response called for pt with low bp , pt awake and responding to questions , called 2a for doctor to respond  Spoke with Dr Renae GlossWieting who will come to see pt , Marquis LunchKatie CCu nurse and Supervisor Maggie SchwalbeCheryl Moore responded and transported pt to ICU bed 9  Dr Renae Glosswieting informed and will go to ICU to examine pt

## 2015-06-04 NOTE — Progress Notes (Signed)
Pt granddaughter  Ladona Ridgelaylor notified of admission to ccu

## 2015-06-04 NOTE — Consult Note (Signed)
GI Inpatient Follow-up Note  Patient Identification: [name] is a 75 y.o. male with cirrhosis of unknown etiology with recurrent ascites, baseline confusion.  Subjective: He was transferred to ICU this morning after a rapid response for hypotension and no urinary output since his paracentesis.  No bowel movements have been recorded, [name] (nurse in ICU) reports no BM's have occurred since he was transferred.  He has been given albumin. (12.5 g daily since yesterday)  He denies any pain.  His [name] was present and had many questions about his current condition and what to expect in the future.  Scheduled Inpatient Medications:  . albumin human  12.5 g Intravenous Daily  . aspirin  324 mg Oral Daily  . cefTRIAXone (ROCEPHIN)  IV  1 g Intravenous Q24H  . feeding supplement (ENSURE ENLIVE)  237 mL Oral BID BM  . gabapentin  300 mg Oral QHS  . heparin  5,000 Units Subcutaneous 3 times per day  . lactulose  30 g Oral BID  . midodrine  10 mg Oral TID WC  . pantoprazole  40 mg Oral QAC breakfast  . rosuvastatin  10 mg Oral BH-q7a    Continuous Inpatient Infusions:   . sodium chloride Stopped (06/04/15 0825)  . sodium chloride 40 mL/hr at 06/04/15 0825    PRN Inpatient Medications:  oxyCODONE  Review of Systems: Unable to assess due to baseline confusion.   Physical Examination: BP 92/51 mmHg  Pulse 77  Temp(Src) 97.7 F (36.5 C) (Oral)  Resp 13  Ht 5\' 6"  (1.676 m)  Wt 71.079 kg (156 lb 11.2 oz)  BMI 25.30 kg/m2  SpO2 99% Gen: NAD,pleasantly confused.  [name] is bedside helping him with his meal HEENT: PEERLA, EOMI, Neck: supple, no JVD or thyromegaly Chest: CTA bilaterally, no wheezes, crackles, or other adventitious sounds CV: RRR, no m/g/c/r Abd: soft, tender to palpation, ND, +BS in all four quadrants; no HSM, guarding, ridigity, or rebound tenderness Ext: no edema, well perfused with 2+ pulses, Skin: no rash or lesions noted Lymph: no  LAD  Data: Lab Results  Component Value Date   WBC 19.0* 06/04/2015   HGB 11.2* 06/04/2015   HCT 36.0* 06/04/2015   MCV 70.1* 06/04/2015   PLT 146* 06/04/2015    Recent Labs Lab 06/02/15 1146 06/03/15 0608 06/04/15 0349  HGB 12.3* 11.4* 11.2*   Lab Results  Component Value Date   NA 133* 06/04/2015   K 4.2 06/04/2015   CL 103 06/04/2015   CO2 22 06/04/2015   BUN 44* 06/04/2015   CREATININE 2.91* 06/04/2015   Lab Results  Component Value Date   ALT 43 06/02/2015   AST 84* 06/02/2015   ALKPHOS 588* 06/02/2015   BILITOT 2.1* 06/02/2015    Recent Labs Lab 06/02/15 1146  INR 1.75   Assessment/Plan: [name] is a 75 y.o. male with recurrent ascites, cirrhosis of unknown etiology, body fluid sample was negative, still awaiting culture.  WBC on admission 20.1, currently 19.0  Recommendations: We agree with current treatment plan.  Encourage nursing staff to track BM's. Please see Dr. Earnest ConroyElliott's note for any further recommendations.  We will continue to follow with you. Please call with questions or concerns.  Carney Harderari S Richards, PA-C  I personally performed these services.

## 2015-06-04 NOTE — Progress Notes (Addendum)
No u/o since 4pm yesterday. Lethargy. bp 84/50. Has been receiving ivf since 0531. Dr Madelon Lipsvacchani paged x2 . No response. Will call rapid response. Pt had 8.3 liters fluid removed yesterday with paracentesis. Pt is a FULL CODE

## 2015-06-05 LAB — BASIC METABOLIC PANEL
ANION GAP: 9 (ref 5–15)
BUN: 41 mg/dL — ABNORMAL HIGH (ref 6–20)
CO2: 22 mmol/L (ref 22–32)
Calcium: 7.8 mg/dL — ABNORMAL LOW (ref 8.9–10.3)
Chloride: 100 mmol/L — ABNORMAL LOW (ref 101–111)
Creatinine, Ser: 2.65 mg/dL — ABNORMAL HIGH (ref 0.61–1.24)
GFR, EST AFRICAN AMERICAN: 26 mL/min — AB (ref >60)
GFR, EST NON AFRICAN AMERICAN: 22 mL/min — AB (ref >60)
Glucose, Bld: 115 mg/dL — ABNORMAL HIGH (ref 65–99)
POTASSIUM: 4.4 mmol/L (ref 3.5–5.1)
SODIUM: 131 mmol/L — AB (ref 135–145)

## 2015-06-05 LAB — CBC
HEMATOCRIT: 38 % — AB (ref 40.0–52.0)
Hemoglobin: 11.8 g/dL — ABNORMAL LOW (ref 13.0–18.0)
MCH: 21.9 pg — ABNORMAL LOW (ref 26.0–34.0)
MCHC: 31.1 g/dL — ABNORMAL LOW (ref 32.0–36.0)
MCV: 70.3 fL — AB (ref 80.0–100.0)
Platelets: 140 10*3/uL — ABNORMAL LOW (ref 150–440)
RBC: 5.4 MIL/uL (ref 4.40–5.90)
RDW: 20.2 % — ABNORMAL HIGH (ref 11.5–14.5)
WBC: 19.7 10*3/uL — AB (ref 3.8–10.6)

## 2015-06-05 LAB — PROTIME-INR
INR: 1.67
Prothrombin Time: 19.7 seconds — ABNORMAL HIGH (ref 11.4–15.0)

## 2015-06-05 LAB — URINE CULTURE

## 2015-06-05 NOTE — Consult Note (Signed)
GI Inpatient Follow-up Note  Patient Identification: [name] is a 75 y.o. male with cirrhosis of unknown etiology with recurrent ascites, baseline confusion  Subjective: He denies any pain, reports he is feeling well.  Granddaughter and two other family members are present. His abdomen appears more distended today. EMR indicate one small soft BM today.  Scheduled Inpatient Medications:  . aspirin  324 mg Oral Daily  . cefTRIAXone (ROCEPHIN)  IV  1 g Intravenous Q24H  . feeding supplement (ENSURE ENLIVE)  237 mL Oral BID BM  . gabapentin  300 mg Oral QHS  . heparin  5,000 Units Subcutaneous 3 times per day  . lactulose  30 g Oral BID  . midodrine  10 mg Oral TID WC  . pantoprazole  40 mg Oral QAC breakfast  . rosuvastatin  10 mg Oral BH-q7a    Continuous Inpatient Infusions:     PRN Inpatient Medications:  oxyCODONE  Review of Systems: (granddaughter helped with ROS) Constitutional: Weight loss.  Eyes: No changes in vision. ENT: No oral lesions, sore throat.  GI: see HPI.  Heme/Lymph: No easy bruising.  CV: No chest pain.  GU: No hematuria.  Integumentary: No rashes.  Neuro: No headaches.  Psych: No depression/anxiety.  Endocrine: No heat/cold intolerance.  Allergic/Immunologic: No urticaria.  Resp: No cough, SOB.  Musculoskeletal: No joint swelling.    Physical Examination: BP 99/68 mmHg  Pulse 75  Temp(Src) 98.6 F (37 C) (Oral)  Resp 22  Ht 5\' 6"  (1.676 m)  Wt 73.4 kg (161 lb 13.1 oz)  BMI 26.13 kg/m2  SpO2 96% Gen: NAD, baseline confusion HEENT: PEERLA, EOMI, Neck: supple, no JVD or thyromegaly Chest: Crackles noted bilaterally CV: murmur noted Abd: soft, generalized tenderness, distended, +BS in all four quadrants; no HSM, guarding, ridigity, or rebound tenderness Ext: no edema, well perfused with 2+ pulses, Skin: no rash or lesions noted Lymph: no LAD  Data: Lab Results  Component Value Date   WBC 19.7* 06/05/2015   HGB 11.8* 06/05/2015   HCT  38.0* 06/05/2015   MCV 70.3* 06/05/2015   PLT 140* 06/05/2015    Recent Labs Lab 06/03/15 0608 06/04/15 0349 06/05/15 0535  HGB 11.4* 11.2* 11.8*   Lab Results  Component Value Date   NA 131* 06/05/2015   K 4.4 06/05/2015   CL 100* 06/05/2015   CO2 22 06/05/2015   BUN 41* 06/05/2015   CREATININE 2.65* 06/05/2015   Lab Results  Component Value Date   ALT 43 06/02/2015   AST 84* 06/02/2015   ALKPHOS 588* 06/02/2015   BILITOT 2.1* 06/02/2015    Recent Labs Lab 06/02/15 1146  INR 1.75   Assessment/Plan: [name] is a 75 y.o. male with recurrent ascites, cirrhosis of unknown etiology, body fluid sample was negative, still awaiting culture. WBC on admission 20.1, currently 19.1.  Is being treated with abx.  Body fluid culture, no growth after 2 days. Increased distension since yesterday. Hgb stable.  Recommendations: No new GI recommendations. Continue current treatment plan.  We will continue to follow with you. Please call with questions or concerns.  Carney Harderari S Richards, PA-C  I personally performed these services.

## 2015-06-05 NOTE — Progress Notes (Signed)
Patient ID: [name], male   DOB: 01-17-1941, 75 y.o.   MRN: [id number] [facility] PROGRESS NOTE  PCP: Corky Downs, MD  HPI/Subjective: Patient feels good and offers no complaints. He states that he lives by himself. Blood pressure is been stable but on the lower side.  Objective: Filed Vitals:   06/05/15 1300 06/05/15 1327  BP:  99/68  Pulse: 75   Temp:    Resp: 22     Filed Weights   06/03/15 1637 06/04/15 0459 06/05/15 0220  Weight: 69.899 kg (154 lb 1.6 oz) 71.079 kg (156 lb 11.2 oz) 73.4 kg (161 lb 13.1 oz)    ROS: Review of Systems  Constitutional: Negative for fever and chills.  Eyes: Negative for blurred vision.  Respiratory: Negative for cough and shortness of breath.   Cardiovascular: Negative for chest pain.  Gastrointestinal: Negative for nausea, vomiting, abdominal pain, diarrhea and constipation.  Genitourinary: Negative for dysuria.  Musculoskeletal: Negative for joint pain.  Neurological: Negative for dizziness and headaches.   Exam: Physical Exam  HENT:  Nose: No mucosal edema.  Mouth/Throat: No oropharyngeal exudate or posterior oropharyngeal edema.  Eyes: Conjunctivae, EOM and lids are normal. Pupils are equal, round, and reactive to light.  Neck: No JVD present. Carotid bruit is not present. No edema present. No thyroid mass and no thyromegaly present.  Cardiovascular: Regular rhythm, S1 normal and S2 normal.  Exam reveals no gallop.   Murmur heard.  Systolic murmur is present with a grade of 2/6  Pulses:      Dorsalis pedis pulses are 2+ on the right side, and 2+ on the left side.  Respiratory: No respiratory distress. He has no wheezes. He has rhonchi in the right lower field and the left lower field. He has no rales.  GI: Soft. Bowel sounds are normal. He exhibits distension. There is tenderness in the epigastric area.  Musculoskeletal:       Right ankle: He exhibits no swelling.       Left ankle: He exhibits no swelling.   Lymphadenopathy:    He has no cervical adenopathy.  Neurological: He is alert.  Skin: Skin is warm. Nails show no clubbing.  Bruising on extremities  Psychiatric: He has a normal mood and affect.    Data Reviewed: Basic Metabolic Panel:  Recent Labs Lab 06/02/15 1146 06/03/15 0608 06/04/15 0349 06/05/15 0535  NA 130* 130* 133* 131*  K 5.2* 3.9 4.2 4.4  CL 100* 102 103 100*  CO2 17* 18* 22 22  GLUCOSE 158* 167* 137* 115*  BUN 42* 39* 44* 41*  CREATININE 3.23* 3.06* 2.91* 2.65*  CALCIUM 8.2* 7.4* 7.6* 7.8*   Liver Function Tests:  Recent Labs Lab 06/02/15 1146  AST 84*  ALT 43  ALKPHOS 588*  BILITOT 2.1*  PROT 5.8*  ALBUMIN 2.5*    Recent Labs Lab 06/02/15 1146  LIPASE 34    Recent Labs Lab 06/02/15 1156  AMMONIA 26   CBC:  Recent Labs Lab 06/02/15 1146 06/03/15 0608 06/04/15 0349 06/05/15 0535  WBC 26.8* 20.1* 19.0* 19.7*  NEUTROABS 22.4*  --   --   --   HGB 12.3* 11.4* 11.2* 11.8*  HCT 38.8* 36.8* 36.0* 38.0*  MCV 71.0* 70.6* 70.1* 70.3*  PLT 254 165 146* 140*     Recent Results (from the past 240 hour(s))  Culture, blood (routine x 2)     Status: None (Preliminary result)   Collection Time: 06/02/15 11:57 AM  Result Value  Ref Range Status   Specimen Description BLOOD RIGHT ASSIST CONTROL  Final   Special Requests BOTTLES DRAWN AEROBIC AND ANAEROBIC 4CC  Final   Culture NO GROWTH 2 DAYS  Final   Report Status PENDING  Incomplete  Culture, blood (routine x 2)     Status: None (Preliminary result)   Collection Time: 06/02/15 11:57 AM  Result Value Ref Range Status   Specimen Description BLOOD LEFT ASSIST CONTROL  Final   Special Requests   Final    BOTTLES DRAWN AEROBIC AND ANAEROBIC ANA 3CC AERO 1CC   Culture NO GROWTH 2 DAYS  Final   Report Status PENDING  Incomplete  Urine culture     Status: None   Collection Time: 06/03/15 10:05 AM  Result Value Ref Range Status   Specimen Description URINE, RANDOM  Final   Special Requests  NONE  Final   Culture MULTIPLE SPECIES PRESENT, SUGGEST RECOLLECTION  Final   Report Status 06/05/2015 FINAL  Final  Body fluid culture     Status: None (Preliminary result)   Collection Time: 06/03/15 11:01 AM  Result Value Ref Range Status   Specimen Description CYTO PERI  Final   Special Requests NONE  Final   Gram Stain PENDING  Incomplete   Culture NO GROWTH 2 DAYS  Final   Report Status PENDING  Incomplete  MRSA PCR Screening     Status: None   Collection Time: 06/04/15  7:53 AM  Result Value Ref Range Status   MRSA by PCR NEGATIVE NEGATIVE Final    Comment:        The GeneXpert MRSA Assay (FDA approved for NASAL specimens only), is one component of a comprehensive MRSA colonization surveillance program. It is not intended to diagnose MRSA infection nor to guide or monitor treatment for MRSA infections.      Scheduled Meds: . aspirin  324 mg Oral Daily  . cefTRIAXone (ROCEPHIN)  IV  1 g Intravenous Q24H  . feeding supplement (ENSURE ENLIVE)  237 mL Oral BID BM  . gabapentin  300 mg Oral QHS  . heparin  5,000 Units Subcutaneous 3 times per day  . lactulose  30 g Oral BID  . midodrine  10 mg Oral TID WC  . pantoprazole  40 mg Oral QAC breakfast  . rosuvastatin  10 mg Oral BH-q7a    Assessment/Plan:  1. Hypotension and lethargy- patient's lethargy has improved. Blood pressure is stable but on the lower side on increased midodrine to 10mg  po tid. 2. Cirrhosis s/p large volume paracentesis. With blood pressure being on the lower side it will be difficult to manage his ascites. 3. Hx afib- not on any meds for rate control, was previously on coumadin 4. Hyperlipidemia unspecified- crestor 5. gerd- protonix 6. Acute on ckd stage 4 7. Weakness- PT eval 8. Leukocytosis- WBC still high despite abx.  BC, paracentesis cx negative.  Code Status:     Code Status Orders        Start     Ordered   06/02/15 1803  Full code   Continuous     06/02/15 1802    Code  Status History    Date Active Date Inactive Code Status Order ID Comments User Context   05/11/2015  3:32 PM 05/17/2015  9:41 PM Full Code [id number]  Adrian Saran, MD Inpatient   03/12/2015 12:58 PM 03/13/2015  3:32 PM Full Code [id number]  Enid Baas, MD Inpatient    Advance Directive Documentation  Most Recent Value   Type of Advance Directive  Healthcare Power of Attorney   Pre-existing out of facility DNR order (yellow form or pink MOST form)     "MOST" Form in Place?       Family Communication: spoke with granddaughter yesterday Disposition Plan: tbd  Antibiotics:  rocephin  Time spent: 20minutes  Alford HighlandWIETING, Asmara Backs  [facility]

## 2015-06-05 NOTE — Progress Notes (Signed)
Subjective:  Patient known to [place] from prior admissions. He was seen for acute renal failure last time. His discharge creatinine was 2.19/GFR 28 Admission creatinine is 3.2 Patient underwent large volume paracentesis on January 13 when 8.3 L of fluid was removed After the procedure, patient was started on IV albumin However, he became hypotensive and rapid response team was called. He was transferred to the ICU At present, he appears to be at his baseline Blood pressure is in the 90s Today's creatinine is 2.65 which has improved from yesterday's value of 2.91   Objective:  Vital signs in last 24 hours:  Temp:  [97.7 F (36.5 C)-98.6 F (37 C)] 98.6 F (37 C) (01/15 0400) Pulse Rate:  [67-78] 71 (01/15 0600) Resp:  [9-23] 12 (01/15 0600) BP: (79-127)/(44-114) 92/57 mmHg (01/15 0600) SpO2:  [93 %-98 %] 98 % (01/15 0600) Weight:  [73.4 kg (161 lb 13.1 oz)] 73.4 kg (161 lb 13.1 oz) (01/15 0220)  Weight change: 3.501 kg (7 lb 11.5 oz) Filed Weights   06/03/15 1637 06/04/15 0459 06/05/15 0220  Weight: 69.899 kg (154 lb 1.6 oz) 71.079 kg (156 lb 11.2 oz) 73.4 kg (161 lb 13.1 oz)    Intake/Output:    Intake/Output Summary (Last 24 hours) at 06/05/15 1138 Last data filed at 06/04/15 1933  Gross per 24 hour  Intake      0 ml  Output    201 ml  Net   -201 ml     Physical Exam: General: No acute distress, laying in the bed  HEENT Somewhat dry oral mucous membranes  Neck supple  Pulm/lungs Normal respiratory effort, clear to auscultation  CVS/Heart No rub or gallop  Abdomen:  Soft, mild tenderness, mild distention from ascites   Extremities: No peripheral edema  Neurologic: alert  Skin: No acute rashes  Access:        Basic Metabolic Panel:   Recent Labs Lab 06/02/15 1146 06/03/15 0608 06/04/15 0349 06/05/15 0535  NA 130* 130* 133* 131*  K 5.2* 3.9 4.2 4.4  CL 100* 102 103 100*  CO2 17* 18* 22 22  GLUCOSE 158* 167* 137* 115*  BUN 42* 39* 44* 41*  CREATININE  3.23* 3.06* 2.91* 2.65*  CALCIUM 8.2* 7.4* 7.6* 7.8*     CBC:  Recent Labs Lab 06/02/15 1146 06/03/15 0608 06/04/15 0349 06/05/15 0535  WBC 26.8* 20.1* 19.0* 19.7*  NEUTROABS 22.4*  --   --   --   HGB 12.3* 11.4* 11.2* 11.8*  HCT 38.8* 36.8* 36.0* 38.0*  MCV 71.0* 70.6* 70.1* 70.3*  PLT 254 165 146* 140*      Microbiology:  Recent Results (from the past 720 hour(s))  Body fluid culture     Status: None   Collection Time: 05/13/15 10:00 AM  Result Value Ref Range Status   Specimen Description PERITONEAL  Final   Special Requests NONE  Final   Gram Stain RARE WBC SEEN NO ORGANISMS SEEN   Final   Culture No growth aerobically or anaerobically.  Final   Report Status 05/17/2015 FINAL  Final  Culture, blood (routine x 2)     Status: None (Preliminary result)   Collection Time: 06/02/15 11:57 AM  Result Value Ref Range Status   Specimen Description BLOOD RIGHT ASSIST CONTROL  Final   Special Requests BOTTLES DRAWN AEROBIC AND ANAEROBIC 4CC  Final   Culture NO GROWTH 2 DAYS  Final   Report Status PENDING  Incomplete  Culture, blood (routine x 2)  Status: None (Preliminary result)   Collection Time: 06/02/15 11:57 AM  Result Value Ref Range Status   Specimen Description BLOOD LEFT ASSIST CONTROL  Final   Special Requests   Final    BOTTLES DRAWN AEROBIC AND ANAEROBIC ANA 3CC AERO 1CC   Culture NO GROWTH 2 DAYS  Final   Report Status PENDING  Incomplete  Urine culture     Status: None   Collection Time: 06/03/15 10:05 AM  Result Value Ref Range Status   Specimen Description URINE, RANDOM  Final   Special Requests NONE  Final   Culture MULTIPLE SPECIES PRESENT, SUGGEST RECOLLECTION  Final   Report Status 06/05/2015 FINAL  Final  Body fluid culture     Status: None (Preliminary result)   Collection Time: 06/03/15 11:01 AM  Result Value Ref Range Status   Specimen Description CYTO PERI  Final   Special Requests NONE  Final   Gram Stain PENDING  Incomplete    Culture NO GROWTH 2 DAYS  Final   Report Status PENDING  Incomplete  MRSA PCR Screening     Status: None   Collection Time: 06/04/15  7:53 AM  Result Value Ref Range Status   MRSA by PCR NEGATIVE NEGATIVE Final    Comment:        The GeneXpert MRSA Assay (FDA approved for NASAL specimens only), is one component of a comprehensive MRSA colonization surveillance program. It is not intended to diagnose MRSA infection nor to guide or monitor treatment for MRSA infections.     Coagulation Studies:  Recent Labs  06/02/15 1146  LABPROT 20.4*  INR 1.75    Urinalysis:  Recent Labs  06/03/15 1005  COLORURINE AMBER*  LABSPEC 1.024  PHURINE 5.0  GLUCOSEU NEGATIVE  HGBUR NEGATIVE  BILIRUBINUR NEGATIVE  KETONESUR NEGATIVE  PROTEINUR 30*  NITRITE NEGATIVE  LEUKOCYTESUR NEGATIVE      Imaging: [place] Paracentesis  06/03/2015  INDICATION: Recurrent symptomatic ascites EXAM: ULTRASOUND-GUIDED PARACENTESIS COMPARISON:  Ultrasound-guided paracenteses - 05/17/2015; 05/13/2015 ; liver Doppler ultrasound - 05/16/2015 MEDICATIONS: None. COMPLICATIONS: None immediate TECHNIQUE: Informed written consent was obtained from the patient after a discussion of the risks, benefits and alternatives to treatment. A timeout was performed prior to the initiation of the procedure. Initial ultrasound scanning demonstrates a moderate to large amount of ascites within the right lower abdominal quadrant. The right lower abdomen was prepped and draped in the usual sterile fashion. 1% lidocaine with epinephrine was used for local anesthesia. An ultrasound image was saved for documentation purposed. An 8 Fr Safe-T-Centesis catheter was introduced. The paracentesis was performed. The catheter was removed and a dressing was applied. The patient tolerated the procedure well without immediate post procedural complication. FINDINGS: A total of approximately 8.3 liters of serous fluid was removed. Samples were sent to the  laboratory as requested by the clinical team. IMPRESSION: Successful ultrasound-guided paracentesis yielding 8.3 liters of peritoneal fluid. Electronically Signed   By: Simonne ComeJohn  Watts M.D.   On: 06/03/2015 14:20     Medications:     . aspirin  324 mg Oral Daily  . cefTRIAXone (ROCEPHIN)  IV  1 g Intravenous Q24H  . feeding supplement (ENSURE ENLIVE)  237 mL Oral BID BM  . gabapentin  300 mg Oral QHS  . heparin  5,000 Units Subcutaneous 3 times per day  . lactulose  30 g Oral BID  . midodrine  10 mg Oral TID WC  . pantoprazole  40 mg Oral QAC breakfast  . rosuvastatin  10 mg Oral BH-q7a   oxyCODONE  Assessment/ Plan:  75 y.o. male with a PMHX of dementia,atrial fibrillation, hypertension, GERD, newly discovered cirrhosis and ascites, cholelithiasis, aortic valve replacement, CABG, , was admitted on 05/11/2015 with ascites, worsening lower extremity edema.  1. Acute on chronic kidney disease stage III Baseline creatinine 2.19/GFR 28 Admission creatinine 3.2 today's creatinine 2.65 Acute renal failure is likely secondary to volume shifts related to ascites and cirrhosis - continue to monitor renal function and electrolytes - continue midodrine and iv albumin  2. Cirrhosis with ascites Underwent paracentesis 1/13 8.3 L of fluid was removed continue IV albumin for oncotic support  3. Hyponatremia Likely secondary to cirrhosis Continue salt and fluid restriction   LOS: 3 [name] 1/15/201711:38 AM

## 2015-06-05 NOTE — Progress Notes (Signed)
Called Dr. Clint GuyHower to report that pt pulled out foley catheter. Was inserted today for urinary retention, and pt has dementia. Inquired if should reinsert or leave out. MD stated it was okay to leave it out.

## 2015-06-06 LAB — CBC WITH DIFFERENTIAL/PLATELET
Basophils Absolute: 0.3 10*3/uL — ABNORMAL HIGH (ref 0–0.1)
Basophils Relative: 1 %
Eosinophils Absolute: 0.3 10*3/uL (ref 0–0.7)
Eosinophils Relative: 1 %
HCT: 37.2 % — ABNORMAL LOW (ref 40.0–52.0)
HEMOGLOBIN: 11.7 g/dL — AB (ref 13.0–18.0)
LYMPHS ABS: 1.5 10*3/uL (ref 1.0–3.6)
LYMPHS PCT: 8 %
MCH: 22 pg — AB (ref 26.0–34.0)
MCHC: 31.4 g/dL — ABNORMAL LOW (ref 32.0–36.0)
MCV: 70 fL — AB (ref 80.0–100.0)
Monocytes Absolute: 1.6 10*3/uL — ABNORMAL HIGH (ref 0.2–1.0)
Monocytes Relative: 8 %
NEUTROS PCT: 82 %
Neutro Abs: 15.8 10*3/uL — ABNORMAL HIGH (ref 1.4–6.5)
Platelets: 147 10*3/uL — ABNORMAL LOW (ref 150–440)
RBC: 5.31 MIL/uL (ref 4.40–5.90)
RDW: 20.2 % — ABNORMAL HIGH (ref 11.5–14.5)
WBC: 19.4 10*3/uL — AB (ref 3.8–10.6)

## 2015-06-06 LAB — BASIC METABOLIC PANEL
Anion gap: 10 (ref 5–15)
BUN: 38 mg/dL — ABNORMAL HIGH (ref 6–20)
CALCIUM: 7.7 mg/dL — AB (ref 8.9–10.3)
CHLORIDE: 101 mmol/L (ref 101–111)
CO2: 21 mmol/L — ABNORMAL LOW (ref 22–32)
Creatinine, Ser: 2.39 mg/dL — ABNORMAL HIGH (ref 0.61–1.24)
GFR calc Af Amer: 29 mL/min — ABNORMAL LOW (ref >60)
GFR, EST NON AFRICAN AMERICAN: 25 mL/min — AB (ref >60)
GLUCOSE: 135 mg/dL — AB (ref 65–99)
POTASSIUM: 4.4 mmol/L (ref 3.5–5.1)
SODIUM: 132 mmol/L — AB (ref 135–145)

## 2015-06-06 MED ORDER — FLUDROCORTISONE ACETATE 0.1 MG PO TABS
0.1000 mg | ORAL_TABLET | Freq: Every day | ORAL | Status: DC
  Administered 2015-06-06 – 2015-06-08 (×3): 0.1 mg via ORAL
  Filled 2015-06-06 (×4): qty 1

## 2015-06-06 MED ORDER — DEXTROSE 5 % IV SOLN
1.0000 g | INTRAVENOUS | Status: DC
  Administered 2015-06-07 – 2015-06-08 (×2): 1 g via INTRAVENOUS
  Filled 2015-06-06 (×3): qty 10

## 2015-06-06 MED ORDER — ACETAMINOPHEN 325 MG PO TABS
650.0000 mg | ORAL_TABLET | Freq: Four times a day (QID) | ORAL | Status: DC | PRN
Start: 2015-06-06 — End: 2015-06-08

## 2015-06-06 NOTE — Progress Notes (Signed)
Patient ID: [name], male   DOB: 06/19/1940, 75 y.o.   MRN: [id number] [facility] PROGRESS NOTE  PCP: [name]  HPI/Subjective: Patient feels good and offers no complaints. He states that he lives by himself. Blood pressure is been stable but on the lower side. Patient's abdomen starting to distend again.  Objective: Filed Vitals:   06/06/15 0800 06/06/15 1000  BP: 101/68 106/70  Pulse: 84   Temp:    Resp: 26 20    Filed Weights   06/04/15 0459 06/05/15 0220 06/06/15 0448  Weight: 71.079 kg (156 lb 11.2 oz) 73.4 kg (161 lb 13.1 oz) 71 kg (156 lb 8.4 oz)    ROS: Review of Systems  Constitutional: Negative for fever and chills.  Eyes: Negative for blurred vision.  Respiratory: Negative for cough and shortness of breath.   Cardiovascular: Negative for chest pain.  Gastrointestinal: Negative for nausea, vomiting, abdominal pain, diarrhea and constipation.  Genitourinary: Negative for dysuria.  Musculoskeletal: Negative for joint pain.  Neurological: Negative for dizziness and headaches.   Exam: Physical Exam  HENT:  Nose: No mucosal edema.  Mouth/Throat: No oropharyngeal exudate or posterior oropharyngeal edema.  Eyes: Conjunctivae, EOM and lids are normal. Pupils are equal, round, and reactive to light.  Neck: No JVD present. Carotid bruit is not present. No edema present. No thyroid mass and no thyromegaly present.  Cardiovascular: Regular rhythm, S1 normal and S2 normal.  Exam reveals no gallop.   Murmur heard.  Systolic murmur is present with a grade of 2/6  Pulses:      Dorsalis pedis pulses are 2+ on the right side, and 2+ on the left side.  Respiratory: No respiratory distress. He has no wheezes. He has rhonchi in the right lower field and the left lower field. He has no rales.  GI: Soft. Bowel sounds are normal. He exhibits distension and ascites. There is tenderness in the epigastric area.  Musculoskeletal:       Right ankle: He exhibits no  swelling.       Left ankle: He exhibits no swelling.  Lymphadenopathy:    He has no cervical adenopathy.  Neurological: He is alert.  Skin: Skin is warm. Nails show no clubbing.  Bruising on extremities  Psychiatric: He has a normal mood and affect.    Data Reviewed: Basic Metabolic Panel:  Recent Labs Lab 06/02/15 1146 06/03/15 0608 06/04/15 0349 06/05/15 0535 06/06/15 0428  NA 130* 130* 133* 131* 132*  K 5.2* 3.9 4.2 4.4 4.4  CL 100* 102 103 100* 101  CO2 17* 18* 22 22 21*  GLUCOSE 158* 167* 137* 115* 135*  BUN 42* 39* 44* 41* 38*  CREATININE 3.23* 3.06* 2.91* 2.65* 2.39*  CALCIUM 8.2* 7.4* 7.6* 7.8* 7.7*   Liver Function Tests:  Recent Labs Lab 06/02/15 1146  AST 84*  ALT 43  ALKPHOS 588*  BILITOT 2.1*  PROT 5.8*  ALBUMIN 2.5*    Recent Labs Lab 06/02/15 1146  LIPASE 34    Recent Labs Lab 06/02/15 1156  AMMONIA 26   CBC:  Recent Labs Lab 06/02/15 1146 06/03/15 0608 06/04/15 0349 06/05/15 0535 06/06/15 0428  WBC 26.8* 20.1* 19.0* 19.7* 19.4*  NEUTROABS 22.4*  --   --   --  15.8*  HGB 12.3* 11.4* 11.2* 11.8* 11.7*  HCT 38.8* 36.8* 36.0* 38.0* 37.2*  MCV 71.0* 70.6* 70.1* 70.3* 70.0*  PLT 254 165 146* 140* 147*     Recent Results (from the past  240 hour(s))  Culture, blood (routine x 2)     Status: None (Preliminary result)   Collection Time: 06/02/15 11:57 AM  Result Value Ref Range Status   Specimen Description BLOOD RIGHT ASSIST CONTROL  Final   Special Requests BOTTLES DRAWN AEROBIC AND ANAEROBIC 4CC  Final   Culture NO GROWTH 3 DAYS  Final   Report Status PENDING  Incomplete  Culture, blood (routine x 2)     Status: None (Preliminary result)   Collection Time: 06/02/15 11:57 AM  Result Value Ref Range Status   Specimen Description BLOOD LEFT ASSIST CONTROL  Final   Special Requests   Final    BOTTLES DRAWN AEROBIC AND ANAEROBIC ANA 3CC AERO 1CC   Culture NO GROWTH 3 DAYS  Final   Report Status PENDING  Incomplete  Urine  culture     Status: None   Collection Time: 06/03/15 10:05 AM  Result Value Ref Range Status   Specimen Description URINE, RANDOM  Final   Special Requests NONE  Final   Culture MULTIPLE SPECIES PRESENT, SUGGEST RECOLLECTION  Final   Report Status 06/05/2015 FINAL  Final  Body fluid culture     Status: None (Preliminary result)   Collection Time: 06/03/15 11:01 AM  Result Value Ref Range Status   Specimen Description CYTO PERI  Final   Special Requests NONE  Final   Gram Stain FEW WBC SEEN RARE GRAM NEGATIVE RODS   Final   Culture NO GROWTH 3 DAYS  Final   Report Status PENDING  Incomplete  MRSA PCR Screening     Status: None   Collection Time: 06/04/15  7:53 AM  Result Value Ref Range Status   MRSA by PCR NEGATIVE NEGATIVE Final    Comment:        The GeneXpert MRSA Assay (FDA approved for NASAL specimens only), is one component of a comprehensive MRSA colonization surveillance program. It is not intended to diagnose MRSA infection nor to guide or monitor treatment for MRSA infections.      Scheduled Meds: . aspirin  324 mg Oral Daily  . [START ON 06/07/2015] cefTRIAXone (ROCEPHIN)  IV  1 g Intravenous Q24H  . feeding supplement (ENSURE ENLIVE)  237 mL Oral BID BM  . fludrocortisone  0.1 mg Oral Daily  . gabapentin  300 mg Oral QHS  . heparin  5,000 Units Subcutaneous 3 times per day  . lactulose  30 g Oral BID  . midodrine  10 mg Oral TID WC  . pantoprazole  40 mg Oral QAC breakfast  . rosuvastatin  10 mg Oral BH-q7a    Assessment/Plan:  1. Hypotension and lethargy- patient's lethargy has improved. Blood pressure is stable but on the lower side on increased midodrine to 10mg  po tid, add low dose florinef. 2. Cirrhosis s/p large volume paracentesis. Ascites reaccumulating. With blood pressure being on the lower side it will be difficult to manage his ascites. Likely will end up needing weekly or biweekly paracentesis. 3. Hx afib- not on any meds for rate control,  was previously on coumadin. Holding Coumadin at this point with orthostatic hypotension. Patient will likely need frequent paracentesis to [facility] and ascites. 4. Hyperlipidemia unspecified- crestor 5. gerd- protonix 6. Acute on ckd stage 4 7. Weakness- PT eval 8. Leukocytosis- WBC still high despite abx.  Looking back at old white blood cell count- they have been elevated since mid 2016. All cultures are negative. On empiric Rocephin.  Code Status:     Code  Status Orders        Start     Ordered   06/02/15 1803  Full code   Continuous     06/02/15 1802    Code Status History    Date Active Date Inactive Code Status Order ID Comments User Context   05/11/2015  3:32 PM 05/17/2015  9:41 PM Full Code [id number]  Adrian Saran, Blanchard Inpatient   03/12/2015 12:58 PM 03/13/2015  3:32 PM Full Code [id number]  Enid Baas, Blanchard Inpatient    Advance Directive Documentation        Most Recent Value   Type of Advance Directive  Healthcare Power of Attorney   Pre-existing out of facility DNR order (yellow form or pink MOST form)     "MOST" Form in Place?       Family Communication: spoke with granddaughter today Disposition Plan: Likely will need rehabilitation  Antibiotics:  rocephin  Time spent:  Alford Highland  [facility]

## 2015-06-06 NOTE — Progress Notes (Signed)
Subjective:  Patient seen at bedside in critical care unit. Currently awake and alert and following commands. Creatinine currently down to 2.39. Still has elevated WBC count of 19.4.   Objective:  Vital signs in last 24 hours:  Temp:  [97.7 F (36.5 C)-98.5 F (36.9 C)] 97.7 F (36.5 C) (01/16 0754) Pulse Rate:  [71-90] 84 (01/16 0800) Resp:  [13-27] 26 (01/16 0800) BP: (79-139)/(46-102) 101/68 mmHg (01/16 0800) SpO2:  [81 %-100 %] 94 % (01/16 0800) Weight:  [71 kg (156 lb 8.4 oz)] 71 kg (156 lb 8.4 oz) (01/16 0448)  Weight change: -2.4 kg (-5 lb 4.7 oz) Filed Weights   06/04/15 0459 06/05/15 0220 06/06/15 0448  Weight: 71.079 kg (156 lb 11.2 oz) 73.4 kg (161 lb 13.1 oz) 71 kg (156 lb 8.4 oz)    Intake/Output:    Intake/Output Summary (Last 24 hours) at 06/06/15 0937 Last data filed at 06/06/15 0900  Gross per 24 hour  Intake     50 ml  Output    600 ml  Net   -550 ml     Physical Exam: General: No acute distress, laying in the bed  HEENT [place]/AT EOMI hearing intact  Neck supple  Pulm/lungs Normal respiratory effort, clear to auscultation  CVS/Heart S1S2 no rubs  Abdomen:  Soft, distension noted   Extremities: No peripheral edema  Neurologic: Alert, awake, following commands  Skin: No acute rashes          Basic Metabolic Panel:   Recent Labs Lab 06/02/15 1146 06/03/15 0608 06/04/15 0349 06/05/15 0535 06/06/15 0428  NA 130* 130* 133* 131* 132*  K 5.2* 3.9 4.2 4.4 4.4  CL 100* 102 103 100* 101  CO2 17* 18* 22 22 21*  GLUCOSE 158* 167* 137* 115* 135*  BUN 42* 39* 44* 41* 38*  CREATININE 3.23* 3.06* 2.91* 2.65* 2.39*  CALCIUM 8.2* 7.4* 7.6* 7.8* 7.7*     CBC:  Recent Labs Lab 06/02/15 1146 06/03/15 0608 06/04/15 0349 06/05/15 0535 06/06/15 0428  WBC 26.8* 20.1* 19.0* 19.7* 19.4*  NEUTROABS 22.4*  --   --   --  15.8*  HGB 12.3* 11.4* 11.2* 11.8* 11.7*  HCT 38.8* 36.8* 36.0* 38.0* 37.2*  MCV 71.0* 70.6* 70.1* 70.3* 70.0*  PLT 254 165  146* 140* 147*      Microbiology:  Recent Results (from the past 720 hour(s))  Body fluid culture     Status: None   Collection Time: 05/13/15 10:00 AM  Result Value Ref Range Status   Specimen Description PERITONEAL  Final   Special Requests NONE  Final   Gram Stain RARE WBC SEEN NO ORGANISMS SEEN   Final   Culture No growth aerobically or anaerobically.  Final   Report Status 05/17/2015 FINAL  Final  Culture, blood (routine x 2)     Status: None (Preliminary result)   Collection Time: 06/02/15 11:57 AM  Result Value Ref Range Status   Specimen Description BLOOD RIGHT ASSIST CONTROL  Final   Special Requests BOTTLES DRAWN AEROBIC AND ANAEROBIC 4CC  Final   Culture NO GROWTH 3 DAYS  Final   Report Status PENDING  Incomplete  Culture, blood (routine x 2)     Status: None (Preliminary result)   Collection Time: 06/02/15 11:57 AM  Result Value Ref Range Status   Specimen Description BLOOD LEFT ASSIST CONTROL  Final   Special Requests   Final    BOTTLES DRAWN AEROBIC AND ANAEROBIC ANA 3CC AERO 1CC  Culture NO GROWTH 3 DAYS  Final   Report Status PENDING  Incomplete  Urine culture     Status: None   Collection Time: 06/03/15 10:05 AM  Result Value Ref Range Status   Specimen Description URINE, RANDOM  Final   Special Requests NONE  Final   Culture MULTIPLE SPECIES PRESENT, SUGGEST RECOLLECTION  Final   Report Status 06/05/2015 FINAL  Final  Body fluid culture     Status: None (Preliminary result)   Collection Time: 06/03/15 11:01 AM  Result Value Ref Range Status   Specimen Description CYTO PERI  Final   Special Requests NONE  Final   Gram Stain FEW WBC SEEN RARE GRAM NEGATIVE RODS   Final   Culture NO GROWTH 2 DAYS  Final   Report Status PENDING  Incomplete  MRSA PCR Screening     Status: None   Collection Time: 06/04/15  7:53 AM  Result Value Ref Range Status   MRSA by PCR NEGATIVE NEGATIVE Final    Comment:        The GeneXpert MRSA Assay (FDA approved for NASAL  specimens only), is one component of a comprehensive MRSA colonization surveillance program. It is not intended to diagnose MRSA infection nor to guide or monitor treatment for MRSA infections.     Coagulation Studies:  Recent Labs  06/05/15 1404  LABPROT 19.7*  INR 1.67    Urinalysis:  Recent Labs  06/03/15 1005  COLORURINE AMBER*  LABSPEC 1.024  PHURINE 5.0  GLUCOSEU NEGATIVE  HGBUR NEGATIVE  BILIRUBINUR NEGATIVE  KETONESUR NEGATIVE  PROTEINUR 30*  NITRITE NEGATIVE  LEUKOCYTESUR NEGATIVE      Imaging: No results found.   Medications:     . aspirin  324 mg Oral Daily  . cefTRIAXone (ROCEPHIN)  IV  1 g Intravenous Q24H  . feeding supplement (ENSURE ENLIVE)  237 mL Oral BID BM  . gabapentin  300 mg Oral QHS  . heparin  5,000 Units Subcutaneous 3 times per day  . lactulose  30 g Oral BID  . midodrine  10 mg Oral TID WC  . pantoprazole  40 mg Oral QAC breakfast  . rosuvastatin  10 mg Oral BH-q7a   oxyCODONE  Assessment/ Plan:  75 y.o. male with a PMHX of dementia,atrial fibrillation, hypertension, GERD, newly discovered cirrhosis and ascites, cholelithiasis, aortic valve replacement, CABG, , was admitted on 05/11/2015 with ascites, worsening lower extremity edema.  1. Acute on chronic kidney disease stage III Baseline creatinine 2.19/GFR 28 Admission creatinine 3.2 today's creatinine 2.3 Acute renal failure is likely secondary to volume shifts related to ascites and cirrhosis - Creatinine appears to be trending down back towards his prior baseline. Continue supportive care and avoid nephrotoxins as possible.  2. Cirrhosis with ascites Underwent paracentesis 1/13 8.3 L of fluid was removed -Patient completed course of IV albumin 3 days.  3. Hyponatremia Likely secondary to cirrhosis Sodium currently up to 132. Continue to monitor.   LOS: 4 [name] 1/16/20179:37 AM

## 2015-06-06 NOTE — Evaluation (Signed)
Physical Therapy Re-Evaluation Patient Details Name: [name] MRN: [id number] DOB: 1940/11/08 Today's Date: 06/06/2015   History of Present Illness  Patient is a 75 y/o male that presents after feeling dizzy and experiencing symptoms of low BP. Noted to have 8L drained off in paracentesis. PMH: a-fib, CHF. new onset liver cirrhosis.   Hospital course additionally significant for rapid response event due to hypotension, lethargy with subsequent transfer to CCU (1/14).  New PT order received; cleared to resume particiaption with session.  Clinical Impression  Upon evaluation, patient alert and oriented to self, location only; generally confused with limited insight into deficits and medical condition.  Bilat UE/LE strength and ROM grossly symmetrical and WFL for basic transfers/mobility; however, mobility progression significantly limited by symptomatic orthostasis (see vitals flowsheet for details).  Slight recovery with accommodation to upright position and light seated therex, but unable to tolerate sit/stand or OOB attempts at this time.  Will continue to monitor/progress as able.  RN/MD informed/aware of patient peformance and barriers to mobility. Would benefit from skilled PT to address above deficits and promote optimal return to PLOF; recommend transition to STR upon discharge from acute hospitalization.     Follow Up Recommendations SNF    Equipment Recommendations       Recommendations for Other Services       Precautions / Restrictions Precautions Precautions: Fall Restrictions Weight Bearing Restrictions: No      Mobility  Bed Mobility Overal bed mobility: Needs Assistance Bed Mobility: Supine to Sit     Supine to sit: Min assist     General bed mobility comments: min assist for truncal elevation, cuing for hand placement/sequencing  Transfers                 General transfer comment: unsafe/unable to tolerate due to symptomatic  orthostasis  Ambulation/Gait             General Gait Details: unsafe/unable to tolerate due to symptomatic orthostasis  Stairs            Wheelchair Mobility    Modified Rankin (Stroke Patients Only)       Balance Overall balance assessment: Needs assistance Sitting-balance support: No upper extremity supported;Feet supported Sitting balance-Leahy Scale: Good         Standing balance comment: unsafe/unable to tolerate due to symptomatic orthostasis                             Pertinent Vitals/Pain Pain Assessment: No/denies pain    Home Living Family/patient expects to be discharged to:: Skilled nursing facility Living Arrangements: Alone Available Help at Discharge: Available PRN/intermittently;Family Type of Home: Apartment       Home Layout: One level Home Equipment: Walker - 4 wheels      Prior Function Level of Independence: Independent with assistive device(s)         Comments: Patient reports he has had "a few" falls in the last 12 months.      Hand Dominance        Extremity/Trunk Assessment   Upper Extremity Assessment: Overall WFL for tasks assessed           Lower Extremity Assessment: Overall WFL for tasks assessed (grossly 4/5 throughout)         Communication   Communication: No difficulties  Cognition Arousal/Alertness: Awake/alert Behavior During Therapy: WFL for tasks assessed/performed Overall Cognitive Status: History of cognitive impairments - at baseline (oriented to self, location;  generally confused with limited insight)                      General Comments      Exercises Other Exercises Other Exercises: Seated UE/LE therex, 1x10, AROM for muscular strength/endurance and facilitation of increased tolerance to upright.  Continued reports of dizziness despite therex, requiring return to supine for recovery (BP increased to 87/59 with accommodation to position/activity in unsupported  sitting)      Assessment/Plan    PT Assessment Patient needs continued PT services  PT Diagnosis Difficulty walking;Generalized weakness   PT Problem List Decreased strength;Decreased knowledge of use of DME;Decreased balance;Decreased mobility;Cardiopulmonary status limiting activity;Decreased activity tolerance;Decreased cognition;Decreased safety awareness;Decreased knowledge of precautions  PT Treatment Interventions DME instruction;Gait training;Stair training;Therapeutic activities;Therapeutic exercise;Balance training;Functional mobility training;Patient/family education   PT Goals (Current goals can be found in the Care Plan section) Acute Rehab PT Goals Patient Stated Goal: To return home safely  PT Goal Formulation: With patient Time For Goal Achievement: 06/20/15 Potential to Achieve Goals: Good    Frequency Min 2X/week   Barriers to discharge Decreased caregiver support Lives alone    Co-evaluation               End of Session   Activity Tolerance: Treatment limited secondary to medical complications (Comment) (symptomatic orthostasis) Patient left: in bed;with call bell/phone within reach;with bed alarm set Nurse Communication: Mobility status (vitals reponse to upright positioning)         Time: 1050-1116 PT Time Calculation (min) (ACUTE ONLY): 26 min   Charges:   PT Evaluation $PT Re-evaluation: 1 Procedure PT Treatments $Therapeutic Exercise: 8-22 mins   PT G Codes:        Adelina Collard H. Manson PasseyBrown, PT, DPT, NCS 06/06/2015, 11:32 AM [phone]

## 2015-06-06 NOTE — Progress Notes (Signed)
Pt transferred to room 117. Tele dc'd per order.  Report given to RN, all belongings sent with patient.

## 2015-06-06 NOTE — Consult Note (Signed)
GI Inpatient Follow-up Note  Patient Identification: [name] is a 75 y.o. male male with cirrhosis of unknown etiology with recurrent ascites, baseline confusion  Subjective: He reports that his abdomen is sore, otherwise no pain. No family members were present.  No BM's noted in I/O.  Scheduled Inpatient Medications:  . aspirin  324 mg Oral Daily  . [START ON 06/07/2015] cefTRIAXone (ROCEPHIN)  IV  1 g Intravenous Q24H  . feeding supplement (ENSURE ENLIVE)  237 mL Oral BID BM  . gabapentin  300 mg Oral QHS  . heparin  5,000 Units Subcutaneous 3 times per day  . lactulose  30 g Oral BID  . midodrine  10 mg Oral TID WC  . pantoprazole  40 mg Oral QAC breakfast  . rosuvastatin  10 mg Oral BH-q7a    Continuous Inpatient Infusions:     PRN Inpatient Medications:  acetaminophen, oxyCODONE  Review of Systems: Unable to assess due to confusion   Physical Examination: BP 106/70 mmHg  Pulse 84  Temp(Src) 97.7 F (36.5 C) (Oral)  Resp 20  Ht 5\' 6"  (1.676 m)  Wt 71 kg (156 lb 8.4 oz)  BMI 25.28 kg/m2  SpO2 94% Gen: NAD,baseline confusion HEENT: PEER[place], EOMI, Neck: supple, no JVD or thyromegaly Chest: crackles noted bilaterally CV: murmur noted Abd: soft, generalized tenderness, distended. +BS in all four quadrants; no HSM, guarding, ridigity, or rebound tenderness Ext: no edema, well perfused with 2+ pulses, Skin: no rash or lesions noted Lymph: no LAD  Data: Lab Results  Component Value Date   WBC 19.4* 06/06/2015   HGB 11.7* 06/06/2015   HCT 37.2* 06/06/2015   MCV 70.0* 06/06/2015   PLT 147* 06/06/2015    Recent Labs Lab 06/04/15 0349 06/05/15 0535 06/06/15 0428  HGB 11.2* 11.8* 11.7*   Lab Results  Component Value Date   NA 132* 06/06/2015   K 4.4 06/06/2015   CL 101 06/06/2015   CO2 21* 06/06/2015   BUN 38* 06/06/2015   CREATININE 2.39* 06/06/2015   Lab Results  Component Value Date   ALT 43 06/02/2015   AST 84* 06/02/2015   ALKPHOS 588*  06/02/2015   BILITOT 2.1* 06/02/2015    Recent Labs Lab 06/05/15 1404  INR 1.67   Assessment/Plan: [name] is a 75 y.o. male with recurrent ascites, cirrhosis of unknown etiology, body fluid sample was negative. WBC on admission 20.1, currently 19.4. Is being treated with abx. Body fluid culture, no growth after 3 days. Increased distension again since yesterday. Hgb stable. No stools noted on I/O   Recommendations: We agree with current treatment plan.  Please see Dr. Earnest ConroyElliott's note for any further recommendations.  We will continue to follow with you.  Please call with questions or concerns.  Carney Harderari S Richards, PA-C  I personally performed these services.

## 2015-06-07 LAB — CULTURE, BLOOD (ROUTINE X 2)
CULTURE: NO GROWTH
Culture: NO GROWTH

## 2015-06-07 LAB — BASIC METABOLIC PANEL
ANION GAP: 8 (ref 5–15)
BUN: 38 mg/dL — AB (ref 6–20)
CO2: 22 mmol/L (ref 22–32)
Calcium: 7.9 mg/dL — ABNORMAL LOW (ref 8.9–10.3)
Chloride: 99 mmol/L — ABNORMAL LOW (ref 101–111)
Creatinine, Ser: 2.36 mg/dL — ABNORMAL HIGH (ref 0.61–1.24)
GFR, EST AFRICAN AMERICAN: 30 mL/min — AB (ref >60)
GFR, EST NON AFRICAN AMERICAN: 26 mL/min — AB (ref >60)
Glucose, Bld: 115 mg/dL — ABNORMAL HIGH (ref 65–99)
POTASSIUM: 4.7 mmol/L (ref 3.5–5.1)
SODIUM: 129 mmol/L — AB (ref 135–145)

## 2015-06-07 LAB — BODY FLUID CULTURE: CULTURE: NO GROWTH

## 2015-06-07 NOTE — Progress Notes (Signed)
Nutrition Follow-up  DOCUMENTATION CODES:   Severe malnutrition in context of chronic illness  INTERVENTION:   Meals and Snacks: Cater to patient preferences Medical Food Supplement Therapy: Continue as ordered and provide encouragement to take, will increase Magic Cup to TID Coordination of Care: continue collecting daily weights   NUTRITION DIAGNOSIS:   Malnutrition related to chronic illness as evidenced by severe depletion of muscle mass, moderate depletion of body fat, severe fluid accumulation.  GOAL:   Patient will meet greater than or equal to 90% of their needs; ongoing  MONITOR:    (Energy Intake, Anthropometrics, Digestive system, Heaptic Profile, Electrolyte and Renal Profile)  REASON FOR ASSESSMENT:   Consult Poor PO  ASSESSMENT:   Pt admitted with hypotension, s/p paracentesis 1/13, 8.25L removed. Pt with h/o liver cirrhosis.    Per MD note, pt abdomen starting to distend again; may require biweekly or weekly paracentesis.  Diet Order:  DIET DYS 3 Room service appropriate?: Yes; Fluid consistency:: Thin; Fluid restriction:: 1200 mL Fluid   Current Nutrition: recorded po intake 0% of meals yesterday. Per RN Jenna Luo pt ate 2 bowls of cereal this am with milk and a banana. Pt reports eating only a banana but willing to eat ice cream, Magic Cup on its way this am.    Gastrointestinal Profile: Last BM: 06/05/2015   Scheduled Medications:  . aspirin  324 mg Oral Daily  . cefTRIAXone (ROCEPHIN)  IV  1 g Intravenous Q24H  . feeding supplement (ENSURE ENLIVE)  237 mL Oral BID BM  . fludrocortisone  0.1 mg Oral Daily  . gabapentin  300 mg Oral QHS  . heparin  5,000 Units Subcutaneous 3 times per day  . lactulose  30 g Oral BID  . midodrine  10 mg Oral TID WC  . pantoprazole  40 mg Oral QAC breakfast  . rosuvastatin  10 mg Oral BH-q7a      Electrolyte/Renal Profile and Glucose Profile:   Recent Labs Lab 06/05/15 0535 06/06/15 0428 06/07/15 0748  NA  131* 132* 129*  K 4.4 4.4 4.7  CL 100* 101 99*  CO2 22 21* 22  BUN 41* 38* 38*  CREATININE 2.65* 2.39* 2.36*  CALCIUM 7.8* 7.7* 7.9*  GLUCOSE 115* 135* 115*   Protein Profile:  Recent Labs Lab 06/02/15 1146  ALBUMIN 2.5*   Hepatic Function Latest Ref Rng 06/02/2015 05/15/2015 05/14/2015  Total Protein 6.5 - 8.1 g/dL 5.8(L) 5.0(L) 4.8(L)  Albumin 3.5 - 5.0 g/dL 2.5(L) 2.2(L) 2.2(L)  AST 15 - 41 U/L 84(H) 60(H) 62(H)  ALT 17 - 63 U/L 43 31 31  Alk Phosphatase 38 - 126 U/L 588(H) 450(H) 434(H)  Total Bilirubin 0.3 - 1.2 mg/dL 2.1(H) 1.3(H) 1.1  Bilirubin, Direct 0.1 - 0.5 mg/dL - - -    Weight Trend since Admission: Filed Weights   06/06/15 0448 06/06/15 2358 06/07/15 0500  Weight: 156 lb 8.4 oz (71 kg) 160 lb (72.576 kg) 158 lb 8 oz (71.895 kg)     Skin:   (Stage I Sacrum pressure ulcer)   Ideal Body Weight:     BMI:  Body mass index is 25.59 kg/(m^2).  Estimated Nutritional Needs:   Kcal:  BEE: 1478kcals, TEE: (IF 1.1-1.3)(AF 1.2) 1950-2305kcals  Protein:  80-96g protein (1.0-1.2g/kg)  Fluid:  2000-2421m of fluid (25-364mkg)  EDUCATION NEEDS:   No education needs identified at this time   HIUnion CityRD, LDN Pager ([phone]/On-Call Pager ([phone]

## 2015-06-07 NOTE — Clinical Social Work Note (Signed)
Clinical Social Work Assessment  Patient Details  Name: [name] MRN: [id number] Date of Birth: Jun 09, 1940  Date of referral:  06/07/15               Reason for consult:  Facility Placement                Permission sought to share information with:  Family Supports Permission granted to share information::  Yes, Verbal Permission Granted  Name::     [name], [name]    Housing/Transportation Living arrangements for the past 2 months:  [place], [place] of Information:  Patient, Other (Comment Required) ([name]) Patient Interpreter Needed:  None Criminal Activity/Legal Involvement Pertinent to Current Situation/Hospitalization:  No - Comment as needed Significant Relationships:  Adult Children Lives with:  Self Do you feel safe going back to the place where you live?  Yes Need for family participation in patient care:  Yes (Comment)  Care giving concerns:  Pt lives alone and recent stay at rehab.    Social Worker assessment / plan:  CSW met with pt to address consult. CSW introduced herself and explained role of social work. CSW also explained process of discharging to SNF. Pt just discharged from [facility] and needs continued rehab. Pt would like to think about returning to SNF, however would like for CSW to speak with pt's [name]. CSW spoke with pt's [name] who is interested in STR at [facility]. CSW sent a new referral and will follow up with bed offers. Pt's [name]'s preferences are [facility] and [facility]. CSW contacted both admission coordinators to review. CSW will continue to follow up with bed offers.   Employment status:  Retired Forensic scientist:  Information systems manager, Medicaid In [place] PT Recommendations:  [place] / Referral to community resources:  [place]  Patient/Family's Response to care:  Pt's [name] was Patent attorney of CSW support.    Patient/Family's Understanding of and Emotional Response to Diagnosis, Current Treatment, and Prognosis:  Pt's [name] understands that pt needs continued STR prior to returning home.   Emotional Assessment Appearance:  Appears stated age Attitude/Demeanor/Rapport:   (Appropriate) Affect (typically observed):  Accepting, Pleasant Orientation:  Oriented to Self, Oriented to Place, Oriented to Situation, Oriented to  Time Alcohol / Substance use:  Never Used Psych involvement (Current and /or in the community):  No (Comment)  Discharge Needs  Concerns to be addressed:  No discharge needs identified Readmission within the last 30 days:  No Current discharge risk:  None Barriers to Discharge:  Continued Medical Work up   [employer], LCSW 06/07/2015, 2:52 PM

## 2015-06-07 NOTE — Care Management Important Message (Signed)
Important Message  Patient Details  Name: [name] MRN: [id number] Date of Birth: Apr 02, 1941   Medicare Important Message Given:  Yes    Olegario Messier A Marylouise Mallet 06/07/2015, 10:03 AM

## 2015-06-07 NOTE — Progress Notes (Signed)
Patient ID: [name], male   DOB: 16-Jul-1940, 75 y.o.   MRN: [id number] [facility] PROGRESS NOTE  PCP: Corky Downs, MD  HPI/Subjective: Patient has no complaints. Blood pressure is been stable but on the lower side. Patient's abdomen starting to distend again.  Objective: Filed Vitals:   06/07/15 0846 06/07/15 1448  BP: 100/66 111/60  Pulse: 80 81  Temp: 97.7 F (36.5 C) 98.1 F (36.7 C)  Resp: 20 20    Filed Weights   06/06/15 0448 06/06/15 2358 06/07/15 0500  Weight: 71 kg (156 lb 8.4 oz) 72.576 kg (160 lb) 71.895 kg (158 lb 8 oz)    ROS: Review of Systems  Constitutional: Negative for fever and chills.  Eyes: Negative for blurred vision.  Respiratory: Negative for cough and shortness of breath.   Cardiovascular: Negative for chest pain.  Gastrointestinal: Negative for nausea, vomiting, abdominal pain, diarrhea and constipation.  Genitourinary: Negative for dysuria.  Musculoskeletal: Negative for joint pain.  Neurological: Negative for dizziness and headaches.   Exam: Physical Exam  HENT:  Nose: No mucosal edema.  Mouth/Throat: No oropharyngeal exudate or posterior oropharyngeal edema.  Eyes: Conjunctivae, EOM and lids are normal. Pupils are equal, round, and reactive to light.  Neck: No JVD present. Carotid bruit is not present. No edema present. No thyroid mass and no thyromegaly present.  Cardiovascular: Regular rhythm, S1 normal and S2 normal.  Exam reveals no gallop.   Murmur heard.  Systolic murmur is present with a grade of 2/6  Pulses:      Dorsalis pedis pulses are 2+ on the right side, and 2+ on the left side.  Respiratory: No respiratory distress. He has no wheezes. He has no rhonchi. He has no rales.  GI: Soft. Bowel sounds are normal. He exhibits distension and ascites. There is no tenderness.  Musculoskeletal:       Right ankle: He exhibits no swelling.       Left ankle: He exhibits no swelling.  Lymphadenopathy:    He has no  cervical adenopathy.  Neurological: He is alert.  Skin: Skin is warm. Nails show no clubbing.  Bruising on extremities  Psychiatric: He has a normal mood and affect.    Data Reviewed: Basic Metabolic Panel:  Recent Labs Lab 06/03/15 0608 06/04/15 0349 06/05/15 0535 06/06/15 0428 06/07/15 0748  NA 130* 133* 131* 132* 129*  K 3.9 4.2 4.4 4.4 4.7  CL 102 103 100* 101 99*  CO2 18* 22 22 21* 22  GLUCOSE 167* 137* 115* 135* 115*  BUN 39* 44* 41* 38* 38*  CREATININE 3.06* 2.91* 2.65* 2.39* 2.36*  CALCIUM 7.4* 7.6* 7.8* 7.7* 7.9*   Liver Function Tests:  Recent Labs Lab 06/02/15 1146  AST 84*  ALT 43  ALKPHOS 588*  BILITOT 2.1*  PROT 5.8*  ALBUMIN 2.5*    Recent Labs Lab 06/02/15 1146  LIPASE 34    Recent Labs Lab 06/02/15 1156  AMMONIA 26   CBC:  Recent Labs Lab 06/02/15 1146 06/03/15 0608 06/04/15 0349 06/05/15 0535 06/06/15 0428  WBC 26.8* 20.1* 19.0* 19.7* 19.4*  NEUTROABS 22.4*  --   --   --  15.8*  HGB 12.3* 11.4* 11.2* 11.8* 11.7*  HCT 38.8* 36.8* 36.0* 38.0* 37.2*  MCV 71.0* 70.6* 70.1* 70.3* 70.0*  PLT 254 165 146* 140* 147*     Recent Results (from the past 240 hour(s))  Culture, blood (routine x 2)     Status: None   Collection Time:  06/02/15 11:57 AM  Result Value Ref Range Status   Specimen Description BLOOD RIGHT ASSIST CONTROL  Final   Special Requests BOTTLES DRAWN AEROBIC AND ANAEROBIC 4CC  Final   Culture NO GROWTH 5 DAYS  Final   Report Status 06/07/2015 FINAL  Final  Culture, blood (routine x 2)     Status: None   Collection Time: 06/02/15 11:57 AM  Result Value Ref Range Status   Specimen Description BLOOD LEFT ASSIST CONTROL  Final   Special Requests   Final    BOTTLES DRAWN AEROBIC AND ANAEROBIC ANA 3CC AERO 1CC   Culture NO GROWTH 5 DAYS  Final   Report Status 06/07/2015 FINAL  Final  Urine culture     Status: None   Collection Time: 06/03/15 10:05 AM  Result Value Ref Range Status   Specimen Description URINE,  RANDOM  Final   Special Requests NONE  Final   Culture MULTIPLE SPECIES PRESENT, SUGGEST RECOLLECTION  Final   Report Status 06/05/2015 FINAL  Final  Body fluid culture     Status: None   Collection Time: 06/03/15 11:01 AM  Result Value Ref Range Status   Specimen Description CYTO PERI  Final   Special Requests NONE  Final   Gram Stain FEW WBC SEEN RARE GRAM NEGATIVE RODS   Final   Culture No growth aerobically or anaerobically.  Final   Report Status 06/07/2015 FINAL  Final  MRSA PCR Screening     Status: None   Collection Time: 06/04/15  7:53 AM  Result Value Ref Range Status   MRSA by PCR NEGATIVE NEGATIVE Final    Comment:        The GeneXpert MRSA Assay (FDA approved for NASAL specimens only), is one component of a comprehensive MRSA colonization surveillance program. It is not intended to diagnose MRSA infection nor to guide or monitor treatment for MRSA infections.      Scheduled Meds: . aspirin  324 mg Oral Daily  . cefTRIAXone (ROCEPHIN)  IV  1 g Intravenous Q24H  . feeding supplement (ENSURE ENLIVE)  237 mL Oral BID BM  . fludrocortisone  0.1 mg Oral Daily  . gabapentin  300 mg Oral QHS  . heparin  5,000 Units Subcutaneous 3 times per day  . lactulose  30 g Oral BID  . midodrine  10 mg Oral TID WC  . pantoprazole  40 mg Oral QAC breakfast  . rosuvastatin  10 mg Oral BH-q7a    Assessment/Plan:  1. Hypotension and lethargy- patient's lethargy has improved. Blood pressure is stable but on the lower side on increased midodrine to  po tid, added low dose florinef. 2. Cirrhosis s/p large volume paracentesis. Ascites reaccumulating. With blood pressure being on the lower side it is difficult to manage his ascites. Likely will end up needing weekly or biweekly paracentesis. 3. Hx afib- not on any meds for rate control, was previously on coumadin. Holding Coumadin at this point with orthostatic hypotension. Patient will likely need frequent paracentesis to [facility]  and ascites. 4. Hyperlipidemia unspecified- crestor 5. gerd- protonix 6. Acute on ckd stage 4 7. Weakness- PT eval suggested SNF. 8. Leukocytosis- WBC still high despite abx.  Looking back at old white blood cell count- they have been elevated since mid 2016. All cultures are negative. On empiric Rocephin.  * Hyponatremia due to cirrhosis with ascites. F/u BMP.  Code Status:     Code Status Orders        Start  Ordered   06/02/15 1803  Full code   Continuous     06/02/15 1802    Code Status History    Date Active Date Inactive Code Status Order ID Comments User Context   05/11/2015  3:32 PM 05/17/2015  9:41 PM Full Code [id number]  Adrian Saran, MD Inpatient   03/12/2015 12:58 PM 03/13/2015  3:32 PM Full Code [id number]  Enid Baas, MD Inpatient    Advance Directive Documentation        Most Recent Value   Type of Advance Directive  Healthcare Power of Attorney   Pre-existing out of facility DNR order (yellow form or pink MOST form)     "MOST" Form in Place?       Family Communication: spoke with granddaughter today Disposition Plan: Likely discharge to SNF in 2 days.  Greater than 50% time was spent on coordination of care and face-to-face counseling.  Antibiotics:  rocephin  Time spent:  Shaune Pollack  [facility]

## 2015-06-07 NOTE — Clinical Social Work Placement (Signed)
   CLINICAL SOCIAL WORK PLACEMENT  NOTE  Date:  06/07/2015  Patient Details  Name: Tayvian Holycross MRN: [id number] Date of Birth: 26-Feb-1941  Clinical Social Work is seeking post-discharge placement for this patient at the Skilled  Nursing Facility level of care (*CSW will initial, date and re-position this form in  chart as items are completed):  Yes   Patient/family provided with [facility] Clinical Social Work Department's list of facilities offering this level of care within the geographic area requested by the patient (or if unable, by the patient's family).  Yes   Patient/family informed of their freedom to choose among providers that offer the needed level of care, that participate in Medicare, Medicaid or managed care program needed by the patient, have an available bed and are willing to accept the patient.  Yes   Patient/family informed of [facility]'s ownership interest in [facility] and [facility], as well as of the fact that they are under no obligation to receive care at these facilities.  PASRR submitted to EDS on       PASRR number received on       Existing PASRR number confirmed on 06/07/15     FL2 transmitted to all facilities in geographic area requested by pt/family on 06/07/15     FL2 transmitted to all facilities within larger geographic area on       Patient informed that his/her managed care company has contracts with or will negotiate with certain facilities, including the following:            Patient/family informed of bed offers received.  Patient chooses bed at       Physician recommends and patient chooses bed at      Patient to be transferred to   on  .  Patient to be transferred to facility by       Patient family notified on   of transfer.  Name of family member notified:        PHYSICIAN       Additional Comment:    _______________________________________________ Dede Query, LCSW 06/07/2015, 2:46 PM

## 2015-06-07 NOTE — Plan of Care (Signed)
Problem: Education: Goal: Knowledge of [facility] Education information/materials will improve Outcome: Progressing Pt transferred from CCU at midnight.  Pt oriented to room.  No complaints of pain.    Problem: Safety: Goal: Ability to remain free from injury will improve Outcome: Progressing Pt remains free from injury.  Fall precautions in place.

## 2015-06-07 NOTE — Progress Notes (Signed)
Subjective:  Patient transitioned to floor care. Renal function about the same.  Na lower at 129. Resting comfortably.  Objective:  Vital signs in last 24 hours:  Temp:  [97.7 F (36.5 C)-98.4 F (36.9 C)] 97.7 F (36.5 C) (01/17 0522) Pulse Rate:  [71-77] 77 (01/17 0522) Resp:  [8-21] 18 (01/17 0522) BP: (95-106)/(53-79) 104/53 mmHg (01/17 0522) SpO2:  [95 %-98 %] 97 % (01/17 0522) Weight:  [71.895 kg (158 lb 8 oz)-72.576 kg (160 lb)] 71.895 kg (158 lb 8 oz) (01/17 0500)  Weight change: 1.575 kg (3 lb 7.6 oz) Filed Weights   06/06/15 0448 06/06/15 2358 06/07/15 0500  Weight: 71 kg (156 lb 8.4 oz) 72.576 kg (160 lb) 71.895 kg (158 lb 8 oz)    Intake/Output:    Intake/Output Summary (Last 24 hours) at 06/07/15 0846 Last data filed at 06/06/15 2300  Gross per 24 hour  Intake    300 ml  Output      0 ml  Net    300 ml     Physical Exam: General: No acute distress, laying in the bed  HEENT [place]/AT EOMI hearing intact  Neck supple  Pulm/lungs Normal respiratory effort, clear to auscultation  CVS/Heart S1S2 no rubs  Abdomen:  Soft, mild distension noted   Extremities: No peripheral edema  Neurologic: Alert, awake, following commands  Skin: No acute rashes          Basic Metabolic Panel:   Recent Labs Lab 06/03/15 0608 06/04/15 0349 06/05/15 0535 06/06/15 0428 06/07/15 0748  NA 130* 133* 131* 132* 129*  K 3.9 4.2 4.4 4.4 4.7  CL 102 103 100* 101 99*  CO2 18* 22 22 21* 22  GLUCOSE 167* 137* 115* 135* 115*  BUN 39* 44* 41* 38* 38*  CREATININE 3.06* 2.91* 2.65* 2.39* 2.36*  CALCIUM 7.4* 7.6* 7.8* 7.7* 7.9*     CBC:  Recent Labs Lab 06/02/15 1146 06/03/15 0608 06/04/15 0349 06/05/15 0535 06/06/15 0428  WBC 26.8* 20.1* 19.0* 19.7* 19.4*  NEUTROABS 22.4*  --   --   --  15.8*  HGB 12.3* 11.4* 11.2* 11.8* 11.7*  HCT 38.8* 36.8* 36.0* 38.0* 37.2*  MCV 71.0* 70.6* 70.1* 70.3* 70.0*  PLT 254 165 146* 140* 147*      Microbiology:  Recent  Results (from the past 720 hour(s))  Body fluid culture     Status: None   Collection Time: 05/13/15 10:00 AM  Result Value Ref Range Status   Specimen Description PERITONEAL  Final   Special Requests NONE  Final   Gram Stain RARE WBC SEEN NO ORGANISMS SEEN   Final   Culture No growth aerobically or anaerobically.  Final   Report Status 05/17/2015 FINAL  Final  Culture, blood (routine x 2)     Status: None   Collection Time: 06/02/15 11:57 AM  Result Value Ref Range Status   Specimen Description BLOOD RIGHT ASSIST CONTROL  Final   Special Requests BOTTLES DRAWN AEROBIC AND ANAEROBIC 4CC  Final   Culture NO GROWTH 5 DAYS  Final   Report Status 06/07/2015 FINAL  Final  Culture, blood (routine x 2)     Status: None   Collection Time: 06/02/15 11:57 AM  Result Value Ref Range Status   Specimen Description BLOOD LEFT ASSIST CONTROL  Final   Special Requests   Final    BOTTLES DRAWN AEROBIC AND ANAEROBIC ANA 3CC AERO 1CC   Culture NO GROWTH 5 DAYS  Final   Report  Status 06/07/2015 FINAL  Final  Urine culture     Status: None   Collection Time: 06/03/15 10:05 AM  Result Value Ref Range Status   Specimen Description URINE, RANDOM  Final   Special Requests NONE  Final   Culture MULTIPLE SPECIES PRESENT, SUGGEST RECOLLECTION  Final   Report Status 06/05/2015 FINAL  Final  Body fluid culture     Status: None (Preliminary result)   Collection Time: 06/03/15 11:01 AM  Result Value Ref Range Status   Specimen Description CYTO PERI  Final   Special Requests NONE  Final   Gram Stain FEW WBC SEEN RARE GRAM NEGATIVE RODS   Final   Culture NO GROWTH 3 DAYS  Final   Report Status PENDING  Incomplete  MRSA PCR Screening     Status: None   Collection Time: 06/04/15  7:53 AM  Result Value Ref Range Status   MRSA by PCR NEGATIVE NEGATIVE Final    Comment:        The GeneXpert MRSA Assay (FDA approved for NASAL specimens only), is one component of a comprehensive MRSA  colonization surveillance program. It is not intended to diagnose MRSA infection nor to guide or monitor treatment for MRSA infections.     Coagulation Studies:  Recent Labs  06/05/15 1404  LABPROT 19.7*  INR 1.67    Urinalysis: No results for input(s): COLORURINE, LABSPEC, PHURINE, GLUCOSEU, HGBUR, BILIRUBINUR, KETONESUR, PROTEINUR, UROBILINOGEN, NITRITE, LEUKOCYTESUR in the last 72 hours.  Invalid input(s): APPERANCEUR    Imaging: No results found.   Medications:     . aspirin  324 mg Oral Daily  . cefTRIAXone (ROCEPHIN)  IV  1 g Intravenous Q24H  . feeding supplement (ENSURE ENLIVE)  237 mL Oral BID BM  . fludrocortisone  0.1 mg Oral Daily  . gabapentin  300 mg Oral QHS  . heparin  5,000 Units Subcutaneous 3 times per day  . lactulose  30 g Oral BID  . midodrine  10 mg Oral TID WC  . pantoprazole  40 mg Oral QAC breakfast  . rosuvastatin  10 mg Oral BH-q7a   acetaminophen, oxyCODONE  Assessment/ Plan:  75 y.o. male with a PMHX of dementia,atrial fibrillation, hypertension, GERD, newly discovered cirrhosis and ascites, cholelithiasis, aortic valve replacement, CABG, , was admitted on 05/11/2015 with ascites, worsening lower extremity edema.  1. Acute on chronic kidney disease stage III Baseline creatinine 2.19/GFR 28 Admission creatinine 3.2 today's creatinine 2.3 Acute renal failure is likely secondary to volume shifts related to ascites and cirrhosis - Cr remains stable at 2.3 which is close to his baseline.  No urgent indication for HD.  Will need to be monitored closely.   2. Cirrhosis with ascites Underwent paracentesis 1/13 8.3 L of fluid was removed -Pt completed course of albumin.   3. Hyponatremia Likely secondary to cirrhosis Sodium down to 129, place of 1200cc fluid restriction.   LOS: 5 [name] 1/17/20178:46 AM

## 2015-06-07 NOTE — Consult Note (Signed)
Pt eating a little, no complaints about the food.  Pt abd more distended than a few days ago.  Has known cirrhosis and renal insuff, both of which could contribute to his hyponatremia.  No bleeding, VSS afebrile.  He does have a flap to his hands when outstretched and dorsiflexed.  Will need to check ammonia level with next blood draw.  WBC 19.4, hgb 11.7, creat 2.36.  May need abd peritoneal tap every few weeks.  Pt stable at this time.

## 2015-06-07 NOTE — NC FL2 (Signed)
[place] MEDICAID FL2 LEVEL OF CARE SCREENING TOOL     IDENTIFICATION  Patient Name: [name] Birthdate: 1941-03-25 Sex: male Admission Date (Current Location): 06/02/2015  Gas and [place] Number:  Chiropodist and Address:  [facility], [address] [place], [place] [address]      Provider Number: [id number]  Attending Physician Name and Address:  Shaune Pollack, MD  Relative Name and Phone Number:       Current Level of Care: Hospital Recommended Level of Care: Skilled Nursing Facility Prior Approval Number:    Date Approved/Denied:   PASRR Number: [id number]  Discharge Plan: SNF    Current Diagnoses: Patient Active Problem List   Diagnosis Date Noted  . Protein-calorie malnutrition, severe 06/04/2015  . Pressure ulcer 06/03/2015  . Hypotension 06/02/2015  . Acute on chronic renal failure (HCC) 06/02/2015  . Hyperkalemia 06/02/2015  . Portal hypertension (HCC)   . Hepatic cirrhosis (HCC)   . Ascites 05/11/2015  . Gastritis and gastroduodenitis   . Gastroesophageal reflux disease without esophagitis   . Chest pain 03/12/2015    Orientation RESPIRATION BLADDER Height & Weight    Self, Time, Situation, Place  Normal Incontinent  (167.6 cm) 158 lbs.  BEHAVIORAL SYMPTOMS/MOOD NEUROLOGICAL BOWEL NUTRITION STATUS      Continent Diet (Dys 3, Thin Liquids, 1200 mL Fluid Restriction)  AMBULATORY STATUS COMMUNICATION OF NEEDS Skin   Extensive Assist Verbally Normal                       Personal Care Assistance Level of Assistance  Bathing, Feeding, Dressing Bathing Assistance: Maximum assistance Feeding assistance: Maximum assistance Dressing Assistance: Maximum assistance     Functional Limitations Info  Sight, Hearing, Speech Sight Info: Adequate Hearing Info: Impaired Speech Info: Adequate    SPECIAL CARE FACTORS FREQUENCY  PT (By licensed PT)     PT Frequency: 5              Contractures       Additional Factors Info  Code Status, Allergies Code Status Info: Full Code Allergies Info: Adehesive           Current Medications (06/07/2015):  This is the current hospital active medication list Current Facility-Administered Medications  Medication Dose Route Frequency Provider Last Rate Last Dose  . acetaminophen (TYLENOL) tablet 650 mg  650 mg Oral Q6H PRN Alford Highland, MD      . aspirin chewable tablet 324 mg  324 mg Oral Daily Altamese Dilling, MD   324 mg at 06/07/15 0918  . cefTRIAXone (ROCEPHIN) 1 g in dextrose 5 % 50 mL IVPB  1 g Intravenous Q24H Bertram Savin, RPH   1 g at 06/07/15 1119  . feeding supplement (ENSURE ENLIVE) (ENSURE ENLIVE) liquid 237 mL  237 mL Oral BID BM Altamese Dilling, MD   237 mL at 06/06/15 0943  . fludrocortisone (FLORINEF) tablet 0.1 mg  0.1 mg Oral Daily Alford Highland, MD   0.1 mg at 06/07/15 0919  . gabapentin (NEURONTIN) capsule 300 mg  300 mg Oral QHS Altamese Dilling, MD   300 mg at 06/06/15 2242  . heparin injection 5,000 Units  5,000 Units Subcutaneous 3 times per day Altamese Dilling, MD   5,000 Units at 06/07/15 1432  . lactulose (CHRONULAC) 10 GM/15ML solution 30 g  30 g Oral BID Altamese Dilling, MD   30 g at 06/07/15 0918  . midodrine (PROAMATINE) tablet 10 mg  10  mg Oral TID WC Alford Highland, MD   10 mg at 06/07/15 1240  . oxyCODONE (Oxy IR/ROXICODONE) immediate release tablet 5 mg  5 mg Oral Q6H PRN Altamese Dilling, MD   5 mg at 06/03/15 1645  . pantoprazole (PROTONIX) EC tablet 40 mg  40 mg Oral QAC breakfast Altamese Dilling, MD   40 mg at 06/07/15 0919  . rosuvastatin (CRESTOR) tablet 10 mg  10 mg Oral Daymon Larsen, MD   10 mg at 06/07/15 0557     Discharge Medications: Please see discharge summary for a list of discharge medications.  Relevant Imaging Results:  Relevant Lab Results:   Additional Information SSN:  [id number]  Dede Query, LCSW

## 2015-06-08 ENCOUNTER — Encounter: Payer: Self-pay | Admitting: General Practice

## 2015-06-08 ENCOUNTER — Encounter
Admission: RE | Admit: 2015-06-08 | Discharge: 2015-06-08 | Disposition: A | Discharge status: Home / Self Care | Payer: Medicare Other | Source: Ambulatory Visit | Attending: Internal Medicine | Admitting: Internal Medicine

## 2015-06-08 LAB — BASIC METABOLIC PANEL
Anion gap: 7 (ref 5–15)
BUN: 41 mg/dL — AB (ref 6–20)
CHLORIDE: 103 mmol/L (ref 101–111)
CO2: 22 mmol/L (ref 22–32)
CREATININE: 2.36 mg/dL — AB (ref 0.61–1.24)
Calcium: 7.9 mg/dL — ABNORMAL LOW (ref 8.9–10.3)
GFR calc Af Amer: 30 mL/min — ABNORMAL LOW (ref >60)
GFR calc non Af Amer: 26 mL/min — ABNORMAL LOW (ref >60)
Glucose, Bld: 153 mg/dL — ABNORMAL HIGH (ref 65–99)
POTASSIUM: 4.7 mmol/L (ref 3.5–5.1)
SODIUM: 132 mmol/L — AB (ref 135–145)

## 2015-06-08 LAB — CBC
HCT: 37.7 % — ABNORMAL LOW (ref 40.0–52.0)
Hemoglobin: 11.8 g/dL — ABNORMAL LOW (ref 13.0–18.0)
MCH: 22.1 pg — AB (ref 26.0–34.0)
MCHC: 31.3 g/dL — AB (ref 32.0–36.0)
MCV: 70.6 fL — ABNORMAL LOW (ref 80.0–100.0)
PLATELETS: 170 10*3/uL (ref 150–440)
RBC: 5.34 MIL/uL (ref 4.40–5.90)
RDW: 20.7 % — AB (ref 11.5–14.5)
WBC: 19.7 10*3/uL — AB (ref 3.8–10.6)

## 2015-06-08 LAB — AMMONIA: AMMONIA: 29 umol/L (ref 9–35)

## 2015-06-08 MED ORDER — FLUDROCORTISONE ACETATE 0.1 MG PO TABS: 0.1000 mg | ORAL_TABLET | Freq: Every day | ORAL | Status: AC

## 2015-06-08 MED ORDER — MIDODRINE HCL 10 MG PO TABS: 10.0000 mg | ORAL_TABLET | Freq: Three times a day (TID) | ORAL | Status: AC

## 2015-06-08 NOTE — Discharge Instructions (Signed)
Heart healthy diet. Activity as tolerated. Fall precaution. May need paracentesis periodically.  Stop coumadin due to high risk of fall and dementia.

## 2015-06-08 NOTE — Clinical Social Work Placement (Signed)
   CLINICAL SOCIAL WORK PLACEMENT  NOTE  Date:  06/08/2015  Patient Details  Name: [name] MRN: [id number] Date of Birth: 27-Feb-1941  Clinical Social Work is seeking post-discharge placement for this patient at the Skilled  Nursing Facility level of care (*CSW will initial, date and re-position this form in  chart as items are completed):  Yes   Patient/family provided with [facility] Clinical Social Work Department's list of facilities offering this level of care within the geographic area requested by the patient (or if unable, by the patient's family).  Yes   Patient/family informed of their freedom to choose among providers that offer the needed level of care, that participate in Medicare, Medicaid or managed care program needed by the patient, have an available bed and are willing to accept the patient.  Yes   Patient/family informed of [facility]'s ownership interest in [facility] and [facility], as well as of the fact that they are under no obligation to receive care at these facilities.  PASRR submitted to EDS on       PASRR number received on       Existing PASRR number confirmed on 06/07/15     FL2 transmitted to all facilities in geographic area requested by pt/family on 06/07/15     FL2 transmitted to all facilities within larger geographic area on       Patient informed that his/her managed care company has contracts with or will negotiate with certain facilities, including the following:        Yes   Patient/family informed of bed offers received.  Patient [facility] bed at [facility]     Physician recommends and patient chooses bed at  [facility]    Patient to be transferred to [facility] on 06/08/15.  Patient to be transferred to facility by [facility]     Patient family notified on 06/08/15 of transfer.  Name of family member notified:  Shanda Bumps, granddaughter     PHYSICIAN       Additional Comment:     _______________________________________________ Dede Query, LCSW 06/08/2015, 3:36 PM

## 2015-06-08 NOTE — Plan of Care (Signed)
Problem: Safety: Goal: Ability to remain free from injury will improve Outcome: Progressing Pt remains free from injury.  Fall precautions in place.  Pt very pleasant and is not impulsive.

## 2015-06-08 NOTE — Discharge Summary (Signed)
[facility] - [place] at [facility]   PATIENT NAME: [name]    MR#:  [id number]  DATE OF BIRTH:  06/21/1940  DATE OF ADMISSION:  06/02/2015 ADMITTING PHYSICIAN: Altamese Dilling, MD  DATE OF DISCHARGE: 06/08/2015 PRIMARY CARE PHYSICIAN: Corky Downs, MD    ADMISSION DIAGNOSIS:  Ascites [R18.8] Sepsis, due to unspecified organism (HCC) [A41.9] Hypotension, unspecified hypotension type [I95.9] Cirrhosis of liver with ascites, unspecified hepatic cirrhosis type (HCC) [K74.60] Acute renal failure, unspecified acute renal failure type (HCC) [N17.9]   DISCHARGE DIAGNOSIS:  Hypotension and lethargy Cirrhosis with ascites, s/p large volume paracentesis  Hyponatremia Leukocytosis SECONDARY DIAGNOSIS:   Past Medical History  Diagnosis Date  . A-fib (HCC)   . Dementia   . Hypertension   . High cholesterol   . GERD (gastroesophageal reflux disease)   . Acid reflux   . CHF (congestive heart failure) (HCC)   . Liver cirrhosis (HCC)   . Chronic kidney disease     HOSPITAL COURSE:   1. Hypotension and lethargy- patient's lethargy has improved. Blood pressure is stable but on the lower side on increased midodrine to 10mg  po tid, added low dose florinef. Lasix, spironolactone and isordil were stopped. 2. Cirrhosis s/p large volume paracentesis (8L). Ascites reaccumulating. With blood pressure being on the lower side it is difficult to manage his ascites. Likely will end up needing weekly or biweekly paracentesis. 3. Hx afib- not on any meds for rate control, was previously on coumadin. Holding Coumadin at this point with orthostatic hypotension. Patient will likely need frequent paracentesis to [facility] and ascites. High risk of fall, not a good candidate for coumadin. 4. Hyperlipidemia unspecified- crestor 5. gerd- protonix 6. Acute on ckd stage 4, stable. 7. Weakness- PT eval suggested SNF. 8. Leukocytosis- WBC still high despite abx. Looking back at old  white blood cell count- they have been elevated since mid 2016. All cultures are negative. He was treated with empiric Rocephin.  * Hyponatremia due to cirrhosis with ascites. Better, F/u BMP as outpatient.  DISCHARGE CONDITIONS:   Stable, discharge to SNF today.  CONSULTS OBTAINED:  Treatment Team:  Mosetta Pigeon, MD Coal[place] Bing, MD Scot Jun, MD Alford Highland, MD  DRUG ALLERGIES:   Allergies  Allergen Reactions  . Adhesive [Tape] Rash    Redness     DISCHARGE MEDICATIONS:   Current Discharge Medication List    START taking these medications   Details  fludrocortisone (FLORINEF) 0.1 MG tablet Take 1 tablet (0.1 mg total) by mouth daily. Qty: 30 tablet, Refills: 0      CONTINUE these medications which have CHANGED   Details  midodrine (PROAMATINE) 10 MG tablet Take 1 tablet (10 mg total) by mouth 3 (three) times daily with meals. Qty: 90 tablet, Refills: 0      CONTINUE these medications which have NOT CHANGED   Details  alum & mag hydroxide-simeth (MAALOX/MYLANTA) 200-200-20 MG/5ML suspension Take 15 mLs by mouth as needed for indigestion or heartburn.     aspirin (ASPIRIN CHILDRENS) 81 MG chewable tablet Chew 4 tablets (324 mg total) by mouth daily. Qty: 120 tablet, Refills: 0    gabapentin (NEURONTIN) 300 MG capsule Take 300 mg by mouth at bedtime.    lactulose (CHRONULAC) 10 GM/15ML solution Take 45 mLs (30 g total) by mouth 2 (two) times daily. Qty: 240 mL, Refills: 0    pantoprazole (PROTONIX) 40 MG tablet Take 40 mg by mouth 2 (two) times daily.    rosuvastatin (  CRESTOR) 10 MG tablet Take 10 mg by mouth every morning.       STOP taking these medications     furosemide (LASIX) 20 MG tablet      isosorbide dinitrate (ISORDIL) 20 MG tablet      spironolactone (ALDACTONE) 25 MG tablet      warfarin (COUMADIN) 1 MG tablet      dexlansoprazole (DEXILANT) 60 MG capsule      feeding supplement, ENSURE ENLIVE, (ENSURE ENLIVE) LIQD           DISCHARGE INSTRUCTIONS:    If you experience worsening of your admission symptoms, develop shortness of breath, life threatening emergency, suicidal or homicidal thoughts you must seek medical attention immediately by calling 911 or calling your MD immediately  if symptoms less severe.  You Must read complete instructions/literature along with all the possible adverse reactions/side effects for all the Medicines you take and that have been prescribed to you. Take any new Medicines after you have completely understood and accept all the possible adverse reactions/side effects.   Please note  You were cared for by a hospitalist during your hospital stay. If you have any questions about your discharge medications or the care you received while you were in the hospital after you are discharged, you can call the unit and asked to speak with the hospitalist on call if the hospitalist that took care of you is not available. Once you are discharged, your primary care physician will handle any further medical issues. Please note that NO REFILLS for any discharge medications will be authorized once you are discharged, as it is imperative that you return to your primary care physician (or establish a relationship with a primary care physician if you do not have one) for your aftercare needs so that they can reassess your need for medications and monitor your lab values.    Today   SUBJECTIVE    No complaint.  VITAL SIGNS:  Blood pressure 103/57, pulse 77, temperature 97.6 F (36.4 C), temperature source Oral, resp. rate 18, height  (1.676 m), weight 70.761 kg (156 lb), SpO2 96 %.  I/O:   Intake/Output Summary (Last 24 hours) at 06/08/15 0847 Last data filed at 06/07/15 1900  Gross per 24 hour  Intake    840 ml  Output      0 ml  Net    840 ml    PHYSICAL EXAMINATION:  GENERAL:  75 y.o.-year-old patient lying in the bed with no acute distress.  EYES: Pupils equal, round, reactive  to light and accommodation. No scleral icterus. Extraocular muscles intact.  HEENT: Head atraumatic, normocephalic. Oropharynx and nasopharynx clear.  NECK:  Supple, no jugular venous distention. No thyroid enlargement, no tenderness.  LUNGS: Normal breath sounds bilaterally, no wheezing, rales,rhonchi or crepitation. No use of accessory muscles of respiration.  CARDIOVASCULAR: S1, S2 normal. No murmurs, rubs, or gallops.  ABDOMEN: Soft, non-tender, mild distended. Bowel sounds present.  EXTREMITIES: No pedal edema, cyanosis, or clubbing.  NEUROLOGIC: Cranial nerves II through XII are intact. Muscle strength 4/5 in all extremities. Sensation intact. Gait not checked.  PSYCHIATRIC: The patient is alert and oriented x 2. Demented. SKIN: No obvious rash, lesion, or ulcer.   DATA REVIEW:   CBC  Recent Labs Lab 06/08/15 0534  WBC 19.7*  HGB 11.8*  HCT 37.7*  PLT 170    Chemistries   Recent Labs Lab 06/02/15 1146  06/08/15 0534  NA 130*  < > 132*  K  5.2*  < > 4.7  CL 100*  < > 103  CO2 17*  < > 22  GLUCOSE 158*  < > 153*  BUN 42*  < > 41*  CREATININE 3.23*  < > 2.36*  CALCIUM 8.2*  < > 7.9*  AST 84*  --   --   ALT 43  --   --   ALKPHOS 588*  --   --   BILITOT 2.1*  --   --   < > = values in this interval not displayed.  Cardiac Enzymes No results for input(s): TROPONINI in the last 168 hours.  Microbiology Results  Results for orders placed or performed during the hospital encounter of 06/02/15  Culture, blood (routine x 2)     Status: None   Collection Time: 06/02/15 11:57 AM  Result Value Ref Range Status   Specimen Description BLOOD RIGHT ASSIST CONTROL  Final   Special Requests BOTTLES DRAWN AEROBIC AND ANAEROBIC 4CC  Final   Culture NO GROWTH 5 DAYS  Final   Report Status 06/07/2015 FINAL  Final  Culture, blood (routine x 2)     Status: None   Collection Time: 06/02/15 11:57 AM  Result Value Ref Range Status   Specimen Description BLOOD LEFT ASSIST CONTROL   Final   Special Requests   Final    BOTTLES DRAWN AEROBIC AND ANAEROBIC ANA 3CC AERO 1CC   Culture NO GROWTH 5 DAYS  Final   Report Status 06/07/2015 FINAL  Final  Urine culture     Status: None   Collection Time: 06/03/15 10:05 AM  Result Value Ref Range Status   Specimen Description URINE, RANDOM  Final   Special Requests NONE  Final   Culture MULTIPLE SPECIES PRESENT, SUGGEST RECOLLECTION  Final   Report Status 06/05/2015 FINAL  Final  Body fluid culture     Status: None   Collection Time: 06/03/15 11:01 AM  Result Value Ref Range Status   Specimen Description CYTO PERI  Final   Special Requests NONE  Final   Gram Stain FEW WBC SEEN RARE GRAM NEGATIVE RODS   Final   Culture No growth aerobically or anaerobically.  Final   Report Status 06/07/2015 FINAL  Final  MRSA PCR Screening     Status: None   Collection Time: 06/04/15  7:53 AM  Result Value Ref Range Status   MRSA by PCR NEGATIVE NEGATIVE Final    Comment:        The GeneXpert MRSA Assay (FDA approved for NASAL specimens only), is one component of a comprehensive MRSA colonization surveillance program. It is not intended to diagnose MRSA infection nor to guide or monitor treatment for MRSA infections.     RADIOLOGY:  No results found.      Management plans discussed with the patient, family and they are in agreement.  CODE STATUS:     Code Status Orders        Start     Ordered   06/02/15 1803  Full code   Continuous     06/02/15 1802    Code Status History    Date Active Date Inactive Code Status Order ID Comments User Context   05/11/2015  3:32 PM 05/17/2015  9:41 PM Full Code [id number]  Adrian Saran, MD Inpatient   03/12/2015 12:58 PM 03/13/2015  3:32 PM Full Code [id number]  Enid Baas, MD Inpatient    Advance Directive Documentation        Most Recent Value  Type of Advance Directive  Healthcare Power of Attorney   Pre-existing out of facility DNR order (yellow form or pink MOST  form)     "MOST" Form in Place?        TOTAL TIME TAKING CARE OF THIS PATIENT: 36 minutes.    Shaune Pollack M.D on 06/08/2015 at 8:47 AM  Between 7am to 6pm - Pager - [phone]  After 6pm go to [email or web address] - password [email or web address] [facility]  [facility]  Office  [phone]  CC: Primary care physician; Corky Downs, MD

## 2015-06-08 NOTE — Clinical Social Work Note (Signed)
Pt is ready for discharge today to [facility]. Pt's granddaughter chose facility. PT's granddaughter is aware and agreeable to discharge plan. Facility has received discharge information and is ready to admit pt. RN to call report and EMS to provide transportation. CSW is signing off as no further needs identified.   Dede Query, MSW, LCSW  Clinical Social Worker  [phone]

## 2015-06-08 NOTE — Consult Note (Signed)
Pt in NAD, abd mild distention not tender.  Poor appetite.  VSS afebrile.  CBC with stable leukocytosis of uncertain cause.  May want hematology consult.  No new suggestions.

## 2015-06-08 NOTE — Progress Notes (Signed)
EMS here to transport patient to [facility] Room 220B-no distress noted

## 2015-06-08 NOTE — Progress Notes (Signed)
Subjective:  Sodium of 132 today. Renal function stable creatinine at 2.36. Patient resting comfortably in bed.   Objective:  Vital signs in last 24 hours:  Temp:  [97.6 F (36.4 C)-98.1 F (36.7 C)] 97.6 F (36.4 C) (01/18 0447) Pulse Rate:  [77-81] 77 (01/18 0447) Resp:  [16-20] 18 (01/18 0447) BP: (97-111)/(57-63) 103/57 mmHg (01/18 0447) SpO2:  [96 %-97 %] 96 % (01/18 0447) Weight:  [70.761 kg (156 lb)] 70.761 kg (156 lb) (01/18 0500)  Weight change: -1.814 kg (-4 lb) Filed Weights   06/06/15 2358 06/07/15 0500 06/08/15 0500  Weight: 72.576 kg (160 lb) 71.895 kg (158 lb 8 oz) 70.761 kg (156 lb)    Intake/Output:    Intake/Output Summary (Last 24 hours) at 06/08/15 1245 Last data filed at 06/08/15 0954  Gross per 24 hour  Intake    600 ml  Output      0 ml  Net    600 ml     Physical Exam: General: No acute distress, laying in the bed  HEENT [place]/AT EOMI hearing intact  Neck supple  Pulm/lungs Normal respiratory effort, clear to auscultation  CVS/Heart S1S2 no rubs  Abdomen:  Soft, mild distension noted   Extremities: No peripheral edema  Neurologic: Alert, awake, following commands  Skin: No acute rashes          Basic Metabolic Panel:   Recent Labs Lab 06/04/15 0349 06/05/15 0535 06/06/15 0428 06/07/15 0748 06/08/15 0534  NA 133* 131* 132* 129* 132*  K 4.2 4.4 4.4 4.7 4.7  CL 103 100* 101 99* 103  CO2 22 22 21* 22 22  GLUCOSE 137* 115* 135* 115* 153*  BUN 44* 41* 38* 38* 41*  CREATININE 2.91* 2.65* 2.39* 2.36* 2.36*  CALCIUM 7.6* 7.8* 7.7* 7.9* 7.9*     CBC:  Recent Labs Lab 06/02/15 1146 06/03/15 0608 06/04/15 0349 06/05/15 0535 06/06/15 0428 06/08/15 0534  WBC 26.8* 20.1* 19.0* 19.7* 19.4* 19.7*  NEUTROABS 22.4*  --   --   --  15.8*  --   HGB 12.3* 11.4* 11.2* 11.8* 11.7* 11.8*  HCT 38.8* 36.8* 36.0* 38.0* 37.2* 37.7*  MCV 71.0* 70.6* 70.1* 70.3* 70.0* 70.6*  PLT 254 165 146* 140* 147* 170      Microbiology:  Recent  Results (from the past 720 hour(s))  Body fluid culture     Status: None   Collection Time: 05/13/15 10:00 AM  Result Value Ref Range Status   Specimen Description PERITONEAL  Final   Special Requests NONE  Final   Gram Stain RARE WBC SEEN NO ORGANISMS SEEN   Final   Culture No growth aerobically or anaerobically.  Final   Report Status 05/17/2015 FINAL  Final  Culture, blood (routine x 2)     Status: None   Collection Time: 06/02/15 11:57 AM  Result Value Ref Range Status   Specimen Description BLOOD RIGHT ASSIST CONTROL  Final   Special Requests BOTTLES DRAWN AEROBIC AND ANAEROBIC 4CC  Final   Culture NO GROWTH 5 DAYS  Final   Report Status 06/07/2015 FINAL  Final  Culture, blood (routine x 2)     Status: None   Collection Time: 06/02/15 11:57 AM  Result Value Ref Range Status   Specimen Description BLOOD LEFT ASSIST CONTROL  Final   Special Requests   Final    BOTTLES DRAWN AEROBIC AND ANAEROBIC ANA 3CC AERO 1CC   Culture NO GROWTH 5 DAYS  Final   Report Status 06/07/2015  FINAL  Final  Urine culture     Status: None   Collection Time: 06/03/15 10:05 AM  Result Value Ref Range Status   Specimen Description URINE, RANDOM  Final   Special Requests NONE  Final   Culture MULTIPLE SPECIES PRESENT, SUGGEST RECOLLECTION  Final   Report Status 06/05/2015 FINAL  Final  Body fluid culture     Status: None   Collection Time: 06/03/15 11:01 AM  Result Value Ref Range Status   Specimen Description CYTO PERI  Final   Special Requests NONE  Final   Gram Stain FEW WBC SEEN RARE GRAM NEGATIVE RODS   Final   Culture No growth aerobically or anaerobically.  Final   Report Status 06/07/2015 FINAL  Final  MRSA PCR Screening     Status: None   Collection Time: 06/04/15  7:53 AM  Result Value Ref Range Status   MRSA by PCR NEGATIVE NEGATIVE Final    Comment:        The GeneXpert MRSA Assay (FDA approved for NASAL specimens only), is one component of a comprehensive MRSA  colonization surveillance program. It is not intended to diagnose MRSA infection nor to guide or monitor treatment for MRSA infections.     Coagulation Studies:  Recent Labs  06/05/15 1404  LABPROT 19.7*  INR 1.67    Urinalysis: No results for input(s): COLORURINE, LABSPEC, PHURINE, GLUCOSEU, HGBUR, BILIRUBINUR, KETONESUR, PROTEINUR, UROBILINOGEN, NITRITE, LEUKOCYTESUR in the last 72 hours.  Invalid input(s): APPERANCEUR    Imaging: No results found.   Medications:     . aspirin  324 mg Oral Daily  . cefTRIAXone (ROCEPHIN)  IV  1 g Intravenous Q24H  . feeding supplement (ENSURE ENLIVE)  237 mL Oral BID BM  . fludrocortisone  0.1 mg Oral Daily  . gabapentin  300 mg Oral QHS  . heparin  5,000 Units Subcutaneous 3 times per day  . lactulose  30 g Oral BID  . midodrine  10 mg Oral TID WC  . pantoprazole  40 mg Oral QAC breakfast  . rosuvastatin  10 mg Oral BH-q7a   acetaminophen, oxyCODONE  Assessment/ Plan:  75 y.o. male with a PMHX of dementia,atrial fibrillation, hypertension, GERD, newly discovered cirrhosis and ascites, cholelithiasis, aortic valve replacement, CABG, , was admitted on 05/11/2015 with ascites, worsening lower extremity edema.  1. Acute on chronic kidney disease stage III Baseline creatinine 2.19/GFR 28 Admission creatinine 3.2 today's creatinine 2.3 Acute renal failure is likely secondary to volume shifts related to ascites and cirrhosis - creatinine of 2.3 may actually represent his baseline creatinine is now given the episode of acute renal failure.  He will need continued monitoring of his renal function over time.   2. Cirrhosis with ascites Underwent paracentesis 1/13 8.3 L of fluid was removed -distention appears to be stable at present.  Continue to monitor for worsening signs of ascites.  3. Hyponatremia Likely secondary to cirrhosis Sodium improved to 132 after fluid restriction.  Continue to monitor sodium periodically.    LOS: 6 Gregery Walberg 1/18/201712:45 PM

## 2015-06-08 NOTE — Progress Notes (Signed)
Called report to [facility] at [place] to be transported after 3pm

## 2015-06-11 ENCOUNTER — Emergency Department (HOSPITAL_COMMUNITY): Admission: EM | Admit: 2015-06-11 | Payer: Medicare Other

## 2015-06-11 ENCOUNTER — Encounter: Payer: Self-pay | Admitting: Emergency Medicine

## 2015-06-11 ENCOUNTER — Emergency Department
Admission: EM | Admit: 2015-06-11 | Discharge: 2015-06-11 | Payer: Medicare Other | Attending: Pulmonary Disease | Admitting: Pulmonary Disease

## 2015-06-11 ENCOUNTER — Encounter (HOSPITAL_COMMUNITY): Disposition: E | Payer: Medicare Other | Attending: Cardiology

## 2015-06-11 ENCOUNTER — Inpatient Hospital Stay (HOSPITAL_COMMUNITY)
Admit: 2015-06-11 | Discharge: 2015-06-22 | DRG: 314 | Disposition: E | Payer: Medicare Other | Attending: Pulmonary Disease | Admitting: Pulmonary Disease

## 2015-06-11 ENCOUNTER — Encounter (HOSPITAL_COMMUNITY): Disposition: E | Payer: Self-pay | Attending: Cardiology

## 2015-06-11 ENCOUNTER — Emergency Department: Payer: Medicare Other

## 2015-06-11 DIAGNOSIS — R570 Cardiogenic shock: Secondary | ICD-10-CM | POA: Diagnosis present

## 2015-06-11 DIAGNOSIS — I509 Heart failure, unspecified: Secondary | ICD-10-CM | POA: Diagnosis present

## 2015-06-11 DIAGNOSIS — Z951 Presence of aortocoronary bypass graft: Secondary | ICD-10-CM

## 2015-06-11 DIAGNOSIS — E785 Hyperlipidemia, unspecified: Secondary | ICD-10-CM | POA: Diagnosis present

## 2015-06-11 DIAGNOSIS — I4891 Unspecified atrial fibrillation: Secondary | ICD-10-CM | POA: Diagnosis present

## 2015-06-11 DIAGNOSIS — I9589 Other hypotension: Secondary | ICD-10-CM | POA: Insufficient documentation

## 2015-06-11 DIAGNOSIS — I2119 ST elevation (STEMI) myocardial infarction involving other coronary artery of inferior wall: Secondary | ICD-10-CM | POA: Diagnosis not present

## 2015-06-11 DIAGNOSIS — Z952 Presence of prosthetic heart valve: Secondary | ICD-10-CM | POA: Diagnosis not present

## 2015-06-11 DIAGNOSIS — Z515 Encounter for palliative care: Secondary | ICD-10-CM | POA: Diagnosis present

## 2015-06-11 DIAGNOSIS — R001 Bradycardia, unspecified: Secondary | ICD-10-CM | POA: Insufficient documentation

## 2015-06-11 DIAGNOSIS — Z79899 Other long term (current) drug therapy: Secondary | ICD-10-CM | POA: Diagnosis not present

## 2015-06-11 DIAGNOSIS — I252 Old myocardial infarction: Secondary | ICD-10-CM

## 2015-06-11 DIAGNOSIS — Z87891 Personal history of nicotine dependence: Secondary | ICD-10-CM

## 2015-06-11 DIAGNOSIS — K219 Gastro-esophageal reflux disease without esophagitis: Secondary | ICD-10-CM | POA: Diagnosis present

## 2015-06-11 DIAGNOSIS — F0390 Unspecified dementia without behavioral disturbance: Secondary | ICD-10-CM | POA: Diagnosis present

## 2015-06-11 DIAGNOSIS — Z7982 Long term (current) use of aspirin: Secondary | ICD-10-CM

## 2015-06-11 DIAGNOSIS — N189 Chronic kidney disease, unspecified: Secondary | ICD-10-CM | POA: Diagnosis present

## 2015-06-11 DIAGNOSIS — I2129 ST elevation (STEMI) myocardial infarction involving other sites: Secondary | ICD-10-CM

## 2015-06-11 DIAGNOSIS — S20219A Contusion of unspecified front wall of thorax, initial encounter: Secondary | ICD-10-CM | POA: Diagnosis not present

## 2015-06-11 DIAGNOSIS — Z8249 Family history of ischemic heart disease and other diseases of the circulatory system: Secondary | ICD-10-CM

## 2015-06-11 DIAGNOSIS — Y9289 Other specified places as the place of occurrence of the external cause: Secondary | ICD-10-CM | POA: Insufficient documentation

## 2015-06-11 DIAGNOSIS — T82119A Breakdown (mechanical) of unspecified cardiac electronic device, initial encounter: Principal | ICD-10-CM | POA: Diagnosis present

## 2015-06-11 DIAGNOSIS — Y998 Other external cause status: Secondary | ICD-10-CM | POA: Diagnosis not present

## 2015-06-11 DIAGNOSIS — X58XXXA Exposure to other specified factors, initial encounter: Secondary | ICD-10-CM | POA: Insufficient documentation

## 2015-06-11 DIAGNOSIS — I469 Cardiac arrest, cause unspecified: Secondary | ICD-10-CM | POA: Diagnosis present

## 2015-06-11 DIAGNOSIS — E875 Hyperkalemia: Secondary | ICD-10-CM | POA: Diagnosis present

## 2015-06-11 DIAGNOSIS — S21119A Laceration without foreign body of unspecified front wall of thorax without penetration into thoracic cavity, initial encounter: Secondary | ICD-10-CM | POA: Diagnosis not present

## 2015-06-11 DIAGNOSIS — K7460 Unspecified cirrhosis of liver: Secondary | ICD-10-CM | POA: Diagnosis present

## 2015-06-11 DIAGNOSIS — I468 Cardiac arrest due to other underlying condition: Secondary | ICD-10-CM | POA: Diagnosis present

## 2015-06-11 DIAGNOSIS — Z7952 Long term (current) use of systemic steroids: Secondary | ICD-10-CM

## 2015-06-11 DIAGNOSIS — I2510 Atherosclerotic heart disease of native coronary artery without angina pectoris: Secondary | ICD-10-CM | POA: Diagnosis present

## 2015-06-11 DIAGNOSIS — I462 Cardiac arrest due to underlying cardiac condition: Secondary | ICD-10-CM | POA: Diagnosis not present

## 2015-06-11 DIAGNOSIS — Y718 Miscellaneous cardiovascular devices associated with adverse incidents, not elsewhere classified: Secondary | ICD-10-CM | POA: Diagnosis present

## 2015-06-11 DIAGNOSIS — E43 Unspecified severe protein-calorie malnutrition: Secondary | ICD-10-CM | POA: Diagnosis present

## 2015-06-11 DIAGNOSIS — I129 Hypertensive chronic kidney disease with stage 1 through stage 4 chronic kidney disease, or unspecified chronic kidney disease: Secondary | ICD-10-CM | POA: Diagnosis not present

## 2015-06-11 DIAGNOSIS — Y9389 Activity, other specified: Secondary | ICD-10-CM | POA: Diagnosis not present

## 2015-06-11 DIAGNOSIS — T82199A Other mechanical complication of unspecified cardiac device, initial encounter: Secondary | ICD-10-CM

## 2015-06-11 LAB — GLUCOSE, CAPILLARY: Glucose-Capillary: 356 mg/dL — ABNORMAL HIGH (ref 65–99)

## 2015-06-11 LAB — COMPREHENSIVE METABOLIC PANEL
ALK PHOS: 661 U/L — AB (ref 38–126)
ALT: 60 U/L (ref 17–63)
AST: 110 U/L — AB (ref 15–41)
Albumin: 2.3 g/dL — ABNORMAL LOW (ref 3.5–5.0)
Anion gap: 11 (ref 5–15)
BUN: 42 mg/dL — AB (ref 6–20)
CALCIUM: 8 mg/dL — AB (ref 8.9–10.3)
CHLORIDE: 98 mmol/L — AB (ref 101–111)
CO2: 18 mmol/L — AB (ref 22–32)
CREATININE: 2.4 mg/dL — AB (ref 0.61–1.24)
GFR, EST AFRICAN AMERICAN: 29 mL/min — AB (ref >60)
GFR, EST NON AFRICAN AMERICAN: 25 mL/min — AB (ref >60)
Glucose, Bld: 304 mg/dL — ABNORMAL HIGH (ref 65–99)
Potassium: 5.8 mmol/L — ABNORMAL HIGH (ref 3.5–5.1)
SODIUM: 127 mmol/L — AB (ref 135–145)
Total Bilirubin: 1 mg/dL (ref 0.3–1.2)
Total Protein: 5.5 g/dL — ABNORMAL LOW (ref 6.5–8.1)

## 2015-06-11 LAB — POCT I-STAT 3, VENOUS BLOOD GAS (G3P V)
ACID-BASE DEFICIT: 18 mmol/L — AB (ref 0.0–2.0)
BICARBONATE: 10.9 meq/L — AB (ref 20.0–24.0)
O2 Saturation: 79 %
PH VEN: 7.136 — AB (ref 7.250–7.300)
TCO2: 12 mmol/L (ref 0–100)
pCO2, Ven: 31.4 mmHg — ABNORMAL LOW (ref 45.0–50.0)
pO2, Ven: 50 mmHg — ABNORMAL HIGH (ref 30.0–45.0)

## 2015-06-11 LAB — BLOOD GAS, ARTERIAL
ACID-BASE DEFICIT: 12.8 mmol/L — AB (ref 0.0–2.0)
Bicarbonate: 11.6 mEq/L — ABNORMAL LOW (ref 21.0–28.0)
DELIVERY SYSTEMS: POSITIVE
Expiratory PAP: 5
FIO2: 1
INSPIRATORY PAP: 14
MECHANICAL RATE: 12
O2 SAT: 98.6 %
PCO2 ART: 23 mmHg — AB (ref 32.0–48.0)
Patient temperature: 37
pH, Arterial: 7.31 — ABNORMAL LOW (ref 7.350–7.450)
pO2, Arterial: 127 mmHg — ABNORMAL HIGH (ref 83.0–108.0)

## 2015-06-11 LAB — CBC WITH DIFFERENTIAL/PLATELET
BLASTS: 0 %
Band Neutrophils: 0 %
Basophils Absolute: 0.4 10*3/uL — ABNORMAL HIGH (ref 0–0.1)
Basophils Relative: 1 %
EOS PCT: 1 %
Eosinophils Absolute: 0.4 10*3/uL (ref 0–0.7)
HEMATOCRIT: 38.9 % — AB (ref 40.0–52.0)
HEMOGLOBIN: 11.9 g/dL — AB (ref 13.0–18.0)
LYMPHS ABS: 7.7 10*3/uL — AB (ref 1.0–3.6)
LYMPHS PCT: 21 %
MCH: 22.1 pg — AB (ref 26.0–34.0)
MCHC: 30.5 g/dL — AB (ref 32.0–36.0)
MCV: 72.7 fL — ABNORMAL LOW (ref 80.0–100.0)
MONOS PCT: 3 %
Metamyelocytes Relative: 0 %
Monocytes Absolute: 1.1 10*3/uL — ABNORMAL HIGH (ref 0.2–1.0)
Myelocytes: 0 %
NEUTROS ABS: 27.2 10*3/uL — AB (ref 1.4–6.5)
NEUTROS PCT: 74 %
NRBC: 0 /100{WBCs}
Other: 0 %
Platelets: 267 10*3/uL (ref 150–440)
Promyelocytes Absolute: 0 %
RBC: 5.36 MIL/uL (ref 4.40–5.90)
RDW: 20.6 % — ABNORMAL HIGH (ref 11.5–14.5)
WBC: 36.8 10*3/uL — AB (ref 3.8–10.6)

## 2015-06-11 LAB — APTT: APTT: 39 s — AB (ref 24–36)

## 2015-06-11 LAB — PROTIME-INR
INR: 1.35
Prothrombin Time: 16.8 seconds — ABNORMAL HIGH (ref 11.4–15.0)

## 2015-06-11 LAB — TYPE AND SCREEN
ABO/RH(D): O NEG
ANTIBODY SCREEN: NEGATIVE

## 2015-06-11 LAB — LACTIC ACID, PLASMA: Lactic Acid, Venous: 5.7 mmol/L (ref 0.5–2.0)

## 2015-06-11 LAB — TROPONIN I: TROPONIN I: 0.07 ng/mL — AB (ref <0.031)

## 2015-06-11 SURGERY — TEMPORARY PACEMAKER INSERTION

## 2015-06-11 SURGERY — INVASIVE LAB ABORTED CASE

## 2015-06-11 MED ORDER — DOPAMINE-DEXTROSE 3.2-5 MG/ML-% IV SOLN: 10.0000 ug/kg/min | Freq: Once | INTRAVENOUS | Status: DC

## 2015-06-11 MED ORDER — DEXTROSE 5 % IV SOLN
0.5000 ug/min | INTRAVENOUS | Status: DC
  Administered 2015-06-11: 20 ug/min via INTRAVENOUS
  Filled 2015-06-11: qty 4

## 2015-06-11 MED ORDER — ASPIRIN 300 MG RE SUPP
300.0000 mg | Freq: Once | RECTAL | Status: AC
  Administered 2015-06-11: 300 mg via RECTAL

## 2015-06-11 MED ORDER — HEPARIN (PORCINE) IN NACL 2-0.9 UNIT/ML-% IJ SOLN
INTRAMUSCULAR | Status: AC
  Filled 2015-06-11: qty 500

## 2015-06-11 MED ORDER — EPINEPHRINE HCL 0.1 MG/ML IJ SOSY
PREFILLED_SYRINGE | INTRAMUSCULAR | Status: AC
  Administered 2015-06-11
  Filled 2015-06-11: qty 10

## 2015-06-11 MED ORDER — NOREPINEPHRINE BITARTRATE 1 MG/ML IV SOLN
0.0000 ug/min | INTRAVENOUS | Status: DC
  Administered 2015-06-11 – 2015-06-12 (×2): 40 ug/min via INTRAVENOUS
  Filled 2015-06-11 (×2): qty 4

## 2015-06-11 MED ORDER — NOREPINEPHRINE BITARTRATE 1 MG/ML IV SOLN
0.0000 ug/min | INTRAVENOUS | Status: DC
  Administered 2015-06-11: 7 ug/min via INTRAVENOUS
  Filled 2015-06-11 (×2): qty 4

## 2015-06-11 MED ORDER — PIPERACILLIN-TAZOBACTAM 3.375 G IVPB
3.3750 g | Freq: Once | INTRAVENOUS | Status: AC
  Administered 2015-06-11: 3.375 g via INTRAVENOUS
  Filled 2015-06-11: qty 50

## 2015-06-11 MED ORDER — LIDOCAINE HCL (CARDIAC) 20 MG/ML IV SOLN
INTRAVENOUS | Status: AC | PRN
  Administered 2015-06-11: 65 mg via INTRAVENOUS

## 2015-06-11 MED ORDER — ASPIRIN 300 MG RE SUPP
RECTAL | Status: AC
  Administered 2015-06-11: 300 mg via RECTAL
  Filled 2015-06-11: qty 1

## 2015-06-11 MED ORDER — LIDOCAINE HCL (PF) 1 % IJ SOLN
INTRAMUSCULAR | Status: AC
  Filled 2015-06-11: qty 30

## 2015-06-11 MED ORDER — SODIUM BICARBONATE 8.4 % IV SOLN
INTRAVENOUS | Status: AC | PRN
  Administered 2015-06-11 (×2): 25 meq via INTRAVENOUS

## 2015-06-11 MED ORDER — SODIUM CHLORIDE 0.9 % IV BOLUS (SEPSIS)
1000.0000 mL | Freq: Once | INTRAVENOUS | Status: AC
  Administered 2015-06-11: 1000 mL via INTRAVENOUS

## 2015-06-11 MED ORDER — VANCOMYCIN HCL IN DEXTROSE 1-5 GM/200ML-% IV SOLN: 1000.0000 mg | Freq: Once | INTRAVENOUS | Status: DC

## 2015-06-11 MED ORDER — LIDOCAINE HCL (PF) 1 % IJ SOLN
INTRAMUSCULAR | Status: DC | PRN
  Administered 2015-06-11: 23:00:00

## 2015-06-11 MED ORDER — LIDOCAINE HCL (PF) 1 % IJ SOLN: INTRAMUSCULAR | Status: DC | PRN

## 2015-06-11 MED ORDER — DOPAMINE-DEXTROSE 3.2-5 MG/ML-% IV SOLN
0.0000 ug/kg/min | INTRAVENOUS | Status: DC
  Administered 2015-06-11: 15 ug/kg/min via INTRAVENOUS

## 2015-06-11 MED ORDER — DOPAMINE-DEXTROSE 3.2-5 MG/ML-% IV SOLN
INTRAVENOUS | Status: AC | PRN
  Administered 2015-06-11: 10 ug/kg/min via INTRAVENOUS

## 2015-06-11 MED ORDER — ATROPINE SULFATE 1 MG/ML IJ SOLN
INTRAMUSCULAR | Status: AC | PRN
  Administered 2015-06-11 (×3): 1 mg via INTRAVENOUS

## 2015-06-11 MED ORDER — EPINEPHRINE HCL 0.1 MG/ML IJ SOSY
PREFILLED_SYRINGE | INTRAMUSCULAR | Status: AC | PRN
  Administered 2015-06-11: .8 mg via INTRAVENOUS
  Administered 2015-06-11: .25 mg via INTRAVENOUS
  Administered 2015-06-11: 1 mg via INTRAVENOUS
  Administered 2015-06-11: 0.2 mg via INTRAVENOUS
  Administered 2015-06-11 (×2): 0.5 mg via INTRAVENOUS
  Administered 2015-06-11: 0.2 mg via INTRAVENOUS

## 2015-06-11 MED ORDER — VANCOMYCIN HCL IN DEXTROSE 1-5 GM/200ML-% IV SOLN
INTRAVENOUS | Status: AC
  Filled 2015-06-11: qty 200

## 2015-06-11 MED ORDER — DEXTROSE 5 % IV SOLN
0.5000 ug/min | INTRAVENOUS | Status: DC
  Administered 2015-06-11: 35 ug/min via INTRAVENOUS
  Filled 2015-06-11 (×2): qty 4

## 2015-06-11 SURGICAL SUPPLY — 4 items
KIT ENCORE 26 ADVANTAGE (KITS) IMPLANT
KIT HEART LEFT (KITS) IMPLANT
PACK CARDIAC CATHETERIZATION (CUSTOM PROCEDURE TRAY) IMPLANT
TRANSDUCER W/STOPCOCK (MISCELLANEOUS) IMPLANT

## 2015-06-11 SURGICAL SUPPLY — 10 items
CATH S G BIP PACING (SET/KITS/TRAYS/PACK) ×4 IMPLANT
DEVICE RAD COMP TR BAND LRG (VASCULAR PRODUCTS) ×4 IMPLANT
HOVERMATT SINGLE USE (MISCELLANEOUS) ×4 IMPLANT
KIT HEART LEFT (KITS) ×4 IMPLANT
PACK CARDIAC CATHETERIZATION (CUSTOM PROCEDURE TRAY) ×4 IMPLANT
SHEATH PINNACLE 6F 10CM (SHEATH) ×4 IMPLANT
SLEEVE REPOSITIONING LENGTH 30 (MISCELLANEOUS) ×4 IMPLANT
SYR MEDRAD MARK V 150ML (SYRINGE) ×4 IMPLANT
TRANSDUCER W/STOPCOCK (MISCELLANEOUS) ×4 IMPLANT
TUBING CIL FLEX 10 FLL-RA (TUBING) ×4 IMPLANT

## 2015-06-11 NOTE — Consult Note (Signed)
Admit date: 05/27/2015 Referring Physician  Dr. Darnelle Catalan at [facility] Cardiologist  Dr. Juel Burrow Reason for Consultation  Bradycardic cardiac arrest  HPI: This is a 74yo WM with a history of ASCAD s/p CABG and AVR, atrial fibrillation, dementia, HTN, dyslipidemia, GERD, CHF, liver cirrhosis (with history of paracentesis) and CKD who presented to [facility] ER with resuscitated cardiac arrest.  Apparently he was at a nursing facility and was found unresponsive.  He received CPR for 8 minutes and was taken to ER where he was found to have a HR in the 20's and SBP in the 70's.  He was awake though and moving all extremities.  He was given atropine  x 3, 3.5 amps of epi, IV dopamine at , levophed and epi gtt and external pacing.  Capture was lost several times. He was given 1 amp of bicarb for hyperkalemia.  He is intubated. He is now transferred to [facility] for temporary pacer.  CCM is admitting patient.       PMH:   Past Medical History  Diagnosis Date  . A-fib (HCC)   . Dementia   . Hypertension   . High cholesterol   . GERD (gastroesophageal reflux disease)   . Acid reflux   . CHF (congestive heart failure) (HCC)   . Liver cirrhosis (HCC)   . Chronic kidney disease      PSH:   Past Surgical History  Procedure Laterality Date  . Aortic valve replacement    . Cardiac bypass     . Coronary artery bypass graft    . Esophagogastroduodenoscopy (egd) with propofol N/A 05/03/2015    Procedure: ESOPHAGOGASTRODUODENOSCOPY (EGD) WITH PROPOFOL;  Surgeon: Midge Minium, MD;  Location: [facility] ENDOSCOPY;  Service: Endoscopy;  Laterality: N/A;  . Cardiac valve replacement    . Mouth surgery      removed all teeth     Allergies:  Adhesive Prior to Admit Meds:   (Not in a hospital admission) Fam HX:    Family History  Problem Relation Age of Onset  . Cancer Mother   . Hypertension Father   . CAD Father    Social HX:    Social History   Social History  . Marital Status: Single   Spouse Name: N/A  . Number of Children: N/A  . Years of Education: N/A   Occupational History  . Not on file.   Social History Main Topics  . Smoking status: Never Smoker   . Smokeless tobacco: Former Neurosurgeon    Types: Snuff, Chew    Quit date: 03/14/2015  . Alcohol Use: No  . Drug Use: No  . Sexual Activity: Not Currently   Other Topics Concern  . Not on file   Social History Narrative     ROS:  All 11 ROS were addressed and are negative except what is stated in the HPI  Physical Exam: Blood pressure 115/57, pulse 57, temperature 98.2 F (36.8 C), temperature source Rectal, resp. rate 20, height  (1.676 m), weight 172 lb 14.4 oz (78.427 kg), SpO2 97 %.    General: Intubated and sedated Lungs:   Clear bilaterally to auscultation anteriorly Heart:   HRRR S1 S2 Pulses are 2+ & equal.            No carotid bruit. No JVD.  No abdominal bruits. No femoral bruits. Abdomen: Distended abdomen and firm Extremities:   LE cool and cyanotic appearing    Labs:   Lab Results  Component Value  Date   WBC 36.8* 07-05-2015   HGB 11.9* 05-Jul-2015   HCT 38.9* 2015/07/05   MCV 72.7* 07-05-15   PLT 267 July 05, 2015    Recent Labs Lab 05-Jul-2015 1950  NA 127*  K 5.8*  CL 98*  CO2 18*  BUN 42*  CREATININE 2.40*  CALCIUM 8.0*  PROT 5.5*  BILITOT 1.0  ALKPHOS 661*  ALT 60  AST 110*  GLUCOSE 304*   No results found for: PTT Lab Results  Component Value Date   INR 1.35 2015-07-05   INR 1.67 06/05/2015   INR 1.75 06/02/2015   Lab Results  Component Value Date   CKTOTAL 122 05/22/2011   CKMB 1.4 03/12/2015   TROPONINI 0.07* 2015-07-05     Lab Results  Component Value Date   CHOL 131 02/22/2014   Lab Results  Component Value Date   HDL 33* 02/22/2014   Lab Results  Component Value Date   LDLCALC 79 02/22/2014   Lab Results  Component Value Date   TRIG 93 02/22/2014   No results found for: CHOLHDL No results found for: LDLDIRECT    Radiology:  Dg Chest  Portable 1 View  07/05/15  CLINICAL DATA:  Shortness of breath, post CPR EXAM: PORTABLE CHEST 1 VIEW COMPARISON:  06/02/2015 FINDINGS: Cardiomediastinal silhouette is stable. Status post median sternotomy and aortic valve replacement. Study is limited by poor inspiration. No segmental infiltrate or pulmonary edema. Mild basilar atelectasis. IMPRESSION: No segmental infiltrate or pulmonary edema. Limited study by poor inspiration. Mild basilar atelectasis. Electronically Signed   By: Natasha Mead M.D.   On: 07/05/2015 19:55    EKG:  Atrial fibrillation with RBBB  ASSESSMENT/PLAN: 1.  Marked sinus bradycardia with bradycardic arrest now on multiple pressors including Levophed, Dopamine and epi gtt. He has an underlying RBBB.  Patient going emergently to cath lab for temp pacer.  ? Secondary to metabolic derangements.  He is not on any AV nodal blocking agents.   2.  VDRF - per CCM 3.  Hyperkalemia and acidosis- per CCM 4.  Acute on chronic kidney disease - per CCM 5.  Chronic atrial fibrillation - not on anticoagulation due to liver disease 6.  Liver cirrhosis 7.  Dementia  Quintella Reichert, MD  07/05/15  10:19 PM

## 2015-06-11 NOTE — ED Notes (Signed)
Spoke with Thereasa Distance from [facility] cath lab and gave update on patient status.

## 2015-06-11 NOTE — ED Notes (Signed)
Patient brought in by EMS from [facility] [place] [facility] CPR, patient found down by staff. Per staff report about 8 minutes of CPR was performed. Patient was bradycardic with EMS. Upon arrival patients pulse was 27 and was in respiratory distress

## 2015-06-11 NOTE — Code Documentation (Signed)
[facility] arrived at bedside

## 2015-06-11 NOTE — ED Notes (Signed)
Patient to ED via [facility], patient currently being transcutaneously paced. Patient on a non-re breather and in moderate distress, Dr. Darnelle Catalan at bedside.

## 2015-06-11 NOTE — Code Documentation (Signed)
Family at bedside with patient.

## 2015-06-11 NOTE — ED Notes (Signed)
Per Dr. Darnelle Catalan I called [facility] for [facility] talked to [facility]

## 2015-06-11 NOTE — ED Provider Notes (Signed)
[facility] Emergency Department Provider Note  ____________________________________________  Time seen: Approximately 8:34 PM  I have reviewed the triage vital signs and the nursing notes.   HISTORY  Chief Complaint Cardiac Arrest  History limited by cardiac arrest  HPI [name] is a 75 y.o. male patient at rehabilitation where he coated he received CPR for 8 minutes and came into the emergency room in name on the way here and when he got here he had a heart rate in the 20s rash or systolic 70 he was awake and moving all extremities. He received atropine 1 mg 3 epinephrine milligram at a time essentially for total of 2 A until such time as he can get dopamine and put the pacemaker on get his heart pacing we ended up pacing him transcutaneously achieving capture rate of 70 with 70 mA. The parents had to be repositioned once when we lost capture. Patient is currently awake and alert moving all extremities and talking although very faint blood pressure right now is 105 systolic is on dopamine at 20 mics per kilo per minute and norepinephrine at 0.1 mics per kilo per minute. We are attempting to transfer him from dopamine to norepinephrine [facility] is here. They have elected to come wait till he complete the transfer to the norepinephrine. [facility] was called and I spoke to Dr. Herbie Baltimore because his EKG had a patch approximately millimeter of ST elevation in leads II, III, and F new. 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 X-ray was done which shows poor inspiratory volumes but no pneumonia or pneumothorax. I have spoken to the patient's granddaughter who has power of attorney. He is a full code. Patient is also had a double bypass and a artificial aortic valve animal valve placed in the past. He's had paracentesis 3 for his 90s from his cirrhosis.   Past Medical History  Diagnosis Date  . A-fib (HCC)   . Dementia   . Hypertension   . High cholesterol    . GERD (gastroesophageal reflux disease)   . Acid reflux   . CHF (congestive heart failure) (HCC)   . Liver cirrhosis (HCC)   . Chronic kidney disease     Patient Active Problem List   Diagnosis Date Noted  . Protein-calorie malnutrition, severe 06/04/2015  . Pressure ulcer 06/03/2015  . Hypotension 06/02/2015  . Acute on chronic renal failure (HCC) 06/02/2015  . Hyperkalemia 06/02/2015  . Portal hypertension (HCC)   . Hepatic cirrhosis (HCC)   . Ascites 05/11/2015  . Gastritis and gastroduodenitis   . Gastroesophageal reflux disease without esophagitis   . Chest pain 03/12/2015    Past Surgical History  Procedure Laterality Date  . Aortic valve replacement    . Cardiac bypass     . Coronary artery bypass graft    . Esophagogastroduodenoscopy (egd) with propofol N/A 05/03/2015    Procedure: ESOPHAGOGASTRODUODENOSCOPY (EGD) WITH PROPOFOL;  Surgeon: Midge Minium, MD;  Location: [facility] ENDOSCOPY;  Service: Endoscopy;  Laterality: N/A;  . Cardiac valve replacement    . Mouth surgery      removed all teeth     Current Outpatient Rx  Name  Route  Sig  Dispense  Refill  . alum & mag hydroxide-simeth (MAALOX/MYLANTA) 200-200-20 MG/5ML suspension   Oral   Take 15 mLs by mouth as needed for indigestion or heartburn.          Marland Kitchen aspirin (ASPIRIN CHILDRENS) 81 MG chewable tablet  Oral   Chew 4 tablets (324 mg total) by mouth daily.   120 tablet   0   . fludrocortisone (FLORINEF) 0.1 MG tablet   Oral   Take 1 tablet (0.1 mg total) by mouth daily.   30 tablet   0   . gabapentin (NEURONTIN) 300 MG capsule   Oral   Take 300 mg by mouth at bedtime.         Marland Kitchen lactulose (CHRONULAC) 10 GM/15ML solution   Oral   Take 45 mLs (30 g total) by mouth 2 (two) times daily.   240 mL   0   . midodrine (PROAMATINE) 10 MG tablet   Oral   Take 1 tablet (10 mg total) by mouth 3 (three) times daily with meals.   90 tablet   0   . pantoprazole (PROTONIX) 40 MG tablet   Oral    Take 40 mg by mouth 2 (two) times daily.         . rosuvastatin (CRESTOR) 10 MG tablet   Oral   Take 10 mg by mouth every morning.            Allergies Adhesive  Family History  Problem Relation Age of Onset  . Cancer Mother   . Hypertension Father   . CAD Father     Social History Social History  Substance Use Topics  . Smoking status: Never Smoker   . Smokeless tobacco: Former Neurosurgeon    Types: Snuff, Chew    Quit date: 03/14/2015  . Alcohol Use: No    Review of Systems Unable to obtain due to patient's tenuous medical status  ____________________________________________   PHYSICAL EXAM:  VITAL SIGNS: ED Triage Vitals  Enc Vitals Group     BP 06/06/2015 1951 100/49 mmHg     Pulse Rate 05/22/2015 1950 30     Resp 05/23/2015 2000 22     Temp 06/04/2015 2006 98.2 F (36.8 C)     Temp Source 05/30/2015 2006 Rectal     SpO2 06/13/2015 1950 79 %     Weight --      Height --      Head Cir --      Peak Flow --      Pain Score --      Pain Loc --      Pain Edu? --      Excl. in GC? --     Constitutional: Alert and talking. Extremities and ears are blue the rest of his pink at the present time Eyes: Conjunctivae are normal. PERRL. EOMI. pupils were unequal initially equal and reactive now they're fixed and dilated due to his atropine Head: Atraumatic. Nose: No congestion/rhinnorhea. Mouth/Throat: Mucous membranes are moist.  Oropharynx non-erythematous. Neck: No stridor.   Cardiovascular: Normal rate, regular rhythm. Grossly normal heart sounds patient has bruising and a skin tear on his chest from CPR Respiratory: Normal respiratory effort.  No retractions. Lungs CTAB. Musculoskeletal: No lower extremity tenderness nor edema.  No joint effusions. Neurologic:  Normal but weak speech and language. No gross focal neurologic deficits are appreciated.  Skin:  See history of present illness  ____________________________________________   LABS (all labs ordered are  listed, but only abnormal results are displayed)  Labs Reviewed  BLOOD GAS, ARTERIAL - Abnormal; Notable for the following:    pH, Arterial 7.31 (*)    pCO2 arterial 23 (*)    pO2, Arterial 127 (*)    Bicarbonate 11.6 (*)  Acid-base deficit 12.8 (*)    All other components within normal limits  CBC WITH DIFFERENTIAL/PLATELET - Abnormal; Notable for the following:    WBC 36.8 (*)    Hemoglobin 11.9 (*)    HCT 38.9 (*)    MCV 72.7 (*)    MCH 22.1 (*)    MCHC 30.5 (*)    RDW 20.6 (*)    Neutro Abs 27.2 (*)    Lymphs Abs 7.7 (*)    Monocytes Absolute 1.1 (*)    Basophils Absolute 0.4 (*)    All other components within normal limits  PROTIME-INR - Abnormal; Notable for the following:    Prothrombin Time 16.8 (*)    All other components within normal limits  APTT - Abnormal; Notable for the following:    aPTT 39 (*)    All other components within normal limits  TROPONIN I  COMPREHENSIVE METABOLIC PANEL  LACTIC ACID, PLASMA  LACTIC ACID, PLASMA  TYPE AND SCREEN   ____________________________________________  EKG  EKG shows right bundle bradycardic rhythm left axis apparently new ST elevation in II, III, and F read and interpreted by me ____________________________________________  RADIOLOGY  Chest x-ray read and interpreted by me for inspiratory volume no pneumonia or pneumothorax ____________________________________________   PROCEDURES  Procedure(s) performed:   Critical Care performed: Critical care time half an hour see history of present illnes patient may also be septic him much sure his high white blood count is from his CPR or possibly spontaneous bacterial peritonitis since his belly seems to be tender as he says. His white count is 38,000. ____________________________________________   FINAL CLINICAL IMPRESSION(S) / ED DIAGNOSES  Final diagnoses:  Cardiac arrest (HCC)  Acute myocardial infarction involving other coronary artery (HCC)  Other specified  hypotension    Addendum patient lost capture of his transcutaneous pacer [facility] arrived they attempted to capture with their pacer were unable to do so we moved their pacer around still unable to capture we gave the patient some epi as his heart rate dropped into the 40s we began giving him epinephrine about a 10th of a milligram every minute or so slow push through the EJ this resulted in an increase in his blood pressure and heart rate back up into the 60s to 70s. With occasional capture on the pacemaker. Epi drip was made by pharmacy in when it arrived we were able to institute that. Patient is about a stable as he can expected to be without achieving continuous transcutaneous capture. [facility] is ready to take him to insert a pacer there. This is required in additional 45 minutes of critical care time.  Arnaldo Natal, MD 16-Jun-2015 2126

## 2015-06-11 NOTE — ED Notes (Signed)
Dr. Darnelle Catalan at bedside attempting to insert EJ IV, patient is currently being paced.

## 2015-06-11 NOTE — ED Notes (Signed)
Report called to [facility] at [facility] cath lab

## 2015-06-12 LAB — MRSA PCR SCREENING: MRSA by PCR: NEGATIVE

## 2015-06-12 MED ORDER — MORPHINE SULFATE (PF) 4 MG/ML IV SOLN
4.0000 mg | Freq: Once | INTRAVENOUS | Status: AC
  Administered 2015-06-12: 4 mg via INTRAVENOUS

## 2015-06-12 MED ORDER — SODIUM CHLORIDE 0.9 % IV SOLN
5.0000 mg/h | INTRAVENOUS | Status: DC
  Administered 2015-06-12: 5 mg/h via INTRAVENOUS
  Filled 2015-06-12: qty 10

## 2015-06-12 MED ORDER — MORPHINE SULFATE (PF) 4 MG/ML IV SOLN
INTRAVENOUS | Status: AC
  Filled 2015-06-12: qty 1

## 2015-06-13 MED FILL — Lidocaine HCl Local Preservative Free (PF) Inj 1%: INTRAMUSCULAR | Qty: 30 | Status: AC

## 2015-06-13 MED FILL — Heparin Sodium (Porcine) 2 Unit/ML in Sodium Chloride 0.9%: INTRAMUSCULAR | Qty: 1000 | Status: AC

## 2015-06-14 MED FILL — Medication: Qty: 1 | Status: AC

## 2015-06-17 ENCOUNTER — Telehealth: Payer: Self-pay

## 2015-06-17 NOTE — Telephone Encounter (Signed)
On 06/17/2015 I received a death certificate from [facility] (original). The death certificate is for burial. The patient is a patient of Jamie Kato. The death certificate will be taken to Pulmonary Unit this am for signature.  On 07-11-2015 I received the death certificate back from Doctor Marchelle Gearing who signed it for [employer].  I got the death certificate ready and called the funeral home to let them know the death certificate was being mailed to the [facility] per their request.

## 2015-06-22 NOTE — H&P (Signed)
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  PAST MEDICAL HISTORY :  Past Medical History  Diagnosis Date  . A-fib (HCC)   . Dementia   . Hypertension   . High cholesterol   . GERD (gastroesophageal reflux disease)   . Acid reflux   . CHF (congestive heart failure) (HCC)   . Liver cirrhosis (HCC)   . Chronic kidney disease    Past Surgical History  Procedure Laterality Date  . Aortic valve replacement    . Cardiac bypass     . Coronary artery bypass graft    . Esophagogastroduodenoscopy (egd) with propofol N/A 05/03/2015    Procedure: ESOPHAGOGASTRODUODENOSCOPY (EGD) WITH PROPOFOL;  Surgeon: Midge Minium, MD;  Location: [facility] ENDOSCOPY;  Service: Endoscopy;  Laterality: N/A;  . Cardiac valve replacement    . Mouth surgery      removed all teeth    Prior to Admission medications   Medication Sig Start Date End Date Taking? Authorizing Provider  alum & mag hydroxide-simeth (MAALOX/MYLANTA) 200-200-20 MG/5ML suspension Take 15 mLs by mouth as needed for indigestion or heartburn.     Historical Provider, MD  aspirin (ASPIRIN CHILDRENS) 81 MG chewable tablet Chew 4 tablets (324 mg total) by mouth daily. 05/17/15   Adrian Saran, MD   fludrocortisone (FLORINEF) 0.1 MG tablet Take 1 tablet (0.1 mg total) by mouth daily. 06/08/15   Shaune Pollack, MD  gabapentin (NEURONTIN) 300 MG capsule Take 300 mg by mouth at bedtime.    Historical Provider, MD  lactulose (CHRONULAC) 10 GM/15ML solution Take 45 mLs (30 g total) by mouth 2 (two) times daily. 05/17/15   Adrian Saran, MD  midodrine (PROAMATINE) 10 MG tablet Take 1 tablet (10 mg total) by mouth 3 (three) times daily with meals. 06/08/15   Shaune Pollack, MD  pantoprazole (PROTONIX) 40 MG tablet Take 40 mg by mouth 2 (two) times daily.    Historical Provider, MD  rosuvastatin (CRESTOR) 10 MG tablet Take 10 mg by mouth every morning.     Historical Provider, MD   Allergies  Allergen Reactions  . Adhesive [Tape] Rash    Redness     FAMILY HISTORY:  Family History  Problem Relation Age of Onset  . Cancer Mother   . Hypertension Father   . CAD Father    SOCIAL HISTORY:  reports that he has never smoked. He quit smokeless tobacco use about 2 months ago. His smokeless tobacco use included Snuff and Chew. He reports that he does not drink alcohol or use illicit drugs.  REVIEW OF SYSTEMS:  Cannot assess 2/2 AMS  SUBJECTIVE:   VITAL SIGNS: Temp:  [95 F (35 C)-98.2 F (36.8 C)] 95.5 F (35.3 C) (01/22 0000) Pulse Rate:  [  26-75] 63 (01/21 2315) Resp:  [13-30] 14 (01/22 0000) BP: (67-120)/(41-98) 113/48 mmHg (01/22 0000) SpO2:  [79 %-100 %] 100 % (01/22 0000) FiO2 (%):  [100 %] 100 % (01/22 0000) Weight:  [172 lb 14.4 oz (78.427 kg)] 172 lb 14.4 oz (78.427 kg) (01/21 2128) HEMODYNAMICS:   VENTILATOR SETTINGS: Vent Mode:  [-] BIPAP FiO2 (%):  [100 %] 100 % Set Rate:  [10 bmp] 10 bmp INTAKE / OUTPUT: Intake/Output      01/21 0701 - 01/22 0700   I.V. (mL/kg) 52.3 (0.7)   Total Intake(mL/kg) 52.3 (0.7)   Net +52.3         PHYSICAL EXAMINATION: General:  Elderly man, unresponsive, BiPAP on Neuro:  Not responsive, not localizing pain, but w/ some spontaneous, agitated  movements HEENT:  Dry MM Neck: No LAD Cardiovascular:  Weak femoral pulses. Cannot palpate radial pulse. Heart sounds distant. Lungs:  Bronchial breath sounds Abdomen:  Soft Musculoskeletal:  No joint swelling Skin:  Extensive ecchymosis on chest, skin tear noted in L shoulder.  LABS:  CBC  Recent Labs Lab 06/06/15 0428 06/08/15 0534 Jul 02, 2015 1950  WBC 19.4* 19.7* 36.8*  HGB 11.7* 11.8* 11.9*  HCT 37.2* 37.7* 38.9*  PLT 147* 170 267   Coag's  Recent Labs Lab 06/05/15 1404 02-Jul-2015 1950  APTT  --  39*  INR 1.67 1.35   BMET  Recent Labs Lab 06/07/15 0748 06/08/15 0534 2015/07/02 1950  NA 129* 132* 127*  K 4.7 4.7 5.8*  CL 99* 103 98*  CO2 22 22 18*  BUN 38* 41* 42*  CREATININE 2.36* 2.36* 2.40*  GLUCOSE 115* 153* 304*   Electrolytes  Recent Labs Lab 06/07/15 0748 06/08/15 0534 Jul 02, 2015 1950  CALCIUM 7.9* 7.9* 8.0*   Sepsis Markers  Recent Labs Lab 07/02/15 1950  LATICACIDVEN 5.7*   ABG  Recent Labs Lab 07-02-2015 2000  PHART 7.31*  PCO2ART 23*  PO2ART 127*   Liver Enzymes  Recent Labs Lab 07-02-2015 1950  AST 110*  ALT 60  ALKPHOS 661*  BILITOT 1.0  ALBUMIN 2.3*   Cardiac Enzymes  Recent Labs Lab 2015-07-02 1950  TROPONINI 0.07*   Glucose  Recent Labs Lab July 02, 2015 2331  GLUCAP 356*    Imaging Dg Chest Portable 1 View  2015/07/02  CLINICAL DATA:  Shortness of breath, post CPR EXAM: PORTABLE CHEST 1 VIEW COMPARISON:  06/02/2015 FINDINGS: Cardiomediastinal silhouette is stable. Status post median sternotomy and aortic valve replacement. Study is limited by poor inspiration. No segmental infiltrate or pulmonary edema. Mild basilar atelectasis. IMPRESSION: No segmental infiltrate or pulmonary edema. Limited study by poor inspiration. Mild basilar atelectasis. Electronically Signed   By: Natasha Mead M.D.   On: July 02, 2015 19:55    EKG: V-paced   ASSESSMENT / PLAN:  Please see event note. [place], after meeting w/ family for >1  hour, decision was made to transition to comfort measures only.  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  I have personally obtained a history, examined the patient, evaluated laboratory and imaging results, formulated the assessment and plan and placed orders.  CRITICAL CARE: The patient is critically ill with multiple organ systems failure and requires high complexity decision making for assessment and support, frequent evaluation and titration of therapies, application of advanced monitoring technologies and extensive interpretation of multiple databases. Critical Care Time devoted to patient care services described in this note is 85 minutes.   Jamie Kato, MD Pulmonary and  Critical Care Medicine [facility] Pager: [phone]   05/23/2015, 12:50 AM

## 2015-06-22 NOTE — Progress Notes (Signed)
Wasted 150 cc of Morphine IV with Fritzi Mandes RN. Family went home. Body will be taken to morgue very soon.

## 2015-06-22 NOTE — Plan of Care (Signed)
Full note to follow:  Met with family for 60 minutes re: patient's clinical condition and worsening shock. In the setting of his comorbidites there is not a realistic possibility of a meaningful recovery. The family reported that previously (note, after his diagnosis of dementia but prior to his diagnosis of cirrhosis) he had wished that full measures be taken to prolong his life. However, I offered the perspective to his family that given his extreme clinical decline and imminent death, I do not believe that there is a decision to made whether or not to prolong his life, rather, the decision to be made is in regard to the manner of his dying. The patient's POA (granddaughter) and other family members (neice, great-granddaughter, and associated significant others) agreed to my recommendation of comfort measures only.   , MD Pulmonary & Critical Care Medicine June 12, 2015, 12:58 AM  

## 2015-06-22 NOTE — Progress Notes (Signed)
Received patient from cath lab, S/P Emergency TVP. Right femoral TVP in place, pacing 100 % with rate at 70 and ventricular MA 5. Patient unresponsive, will respond to pain with toes pinched, legs pull back yet also bilateral legs noted to be pulling back involuntarily. Bilateral legs are mottled and cool. The left side and patients head and ear and cyanotic looking. Patients chest has severe skin tear from other facility, area red and bloody to surface, area covered with mepilex as well as skin tear to left anterior subclavian area. Chest with dark ecchymotic area and abdomen with bruising as well. Waiting on CCM to come assess patient. See flow sheet for VS

## 2015-06-22 DEATH — deceased

## 2015-06-28 ENCOUNTER — Ambulatory Visit: Payer: Medicare Other | Admitting: Gastroenterology

## 2015-08-20 NOTE — Discharge Summary (Signed)
DISCHARGE SUMMARY    Date of admit: 06/06/2015 10:04 PM Date of discharge: 13-Jun-2015 10:40 AM Length of Stay: 1 days  PCP is MASOUD, JAVED, MD   PROBLEM LIST Patient Active Problem List   Diagnosis Date Noted  . 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 Cardiac arrest ([place])   . Cardiogenic shock ([place])   . Bradycardia on ECG   . Coronary artery disease with hx of myocardial infarct w/o hx of CABG   . Protein-calorie malnutrition, severe 06/04/2015  . Pressure ulcer 06/03/2015  . Hypotension 06/02/2015  . Acute on chronic renal failure ([place]) 06/02/2015  . Hyperkalemia 06/02/2015  . Portal hypertension ([place])   . Hepatic cirrhosis ([place])   . Ascites 05/11/2015  . Gastritis and gastroduodenitis   . Gastroesophageal reflux disease without esophagitis   . Chest pain 03/12/2015      SUMMARY [name] was 75 y.o. patient with   Multiple medical problems esp CAP And s/p AVR and recent cirrhosis. 75yo WM with a history of ASCAD s/p CABG and AVR, atrial fibrillation, dementia, HTN, dyslipidemia, GERD, CHF, liver cirrhosis (with history of paracentesis) and CKD who presented to [facility] ER with resuscitated cardiac arrest. Apparently he was at a nursing facility and was found unresponsive. He received CPR for 8 minutes and was taken to ER where he was found to have a HR in the 20's and SBP in the 70's. He was awake though and moving all extremities. He was given atropine 37m x 3, 3.5 amps of epi, IV dopamine at 279m, levophed and epi gtt and external pacing. Capture was lost several times. He was given 1 amp of bicarb for hyperkalemia. He is intubated. [facility]  has a past medical history of A-fib ([facility] Dementia; Hypertension; High cholesterol; GERD (gastroesophageal reflux disease); Acid reflux; CHF (congestive heart failure) ([place] Liver cirrhosis ([facility] and Chronic kidney disease.   has past surgical history that  includes Aortic valve replacement; Cardiac Bypass ; Coronary artery bypass graft; Esophagogastroduodenoscopy (egd) with propofol (N/A, 05/03/2015); Cardiac valve replacement; and Mouth surgery.    Post admission CCM met with family and advised that given patient critical illness + background severe medical problems changes of meaningful recovery was not realistic  He then expired 1/Jun 13, 2015           SIGNED Dr. MuBrand MalesM.D., F.[facility].P Pulmonary and Critical Care Medicine Staff Physician [facility] Settlementulmonary and Critical Care Pager: [phone] no answer or between  15:00h - 7:00h: call [phone]  07/20/2015 5:34 AM

## 2016-07-03 IMAGING — CR DG CHEST 2V
2 series · 2 of 2 positions shown · non-contrast
Comparison: March 12, 2015 and July 06, 2014

CLINICAL DATA: Left-sided chest pain with shortness of breath

EXAM:
CHEST  2 VIEW

[chest pa]
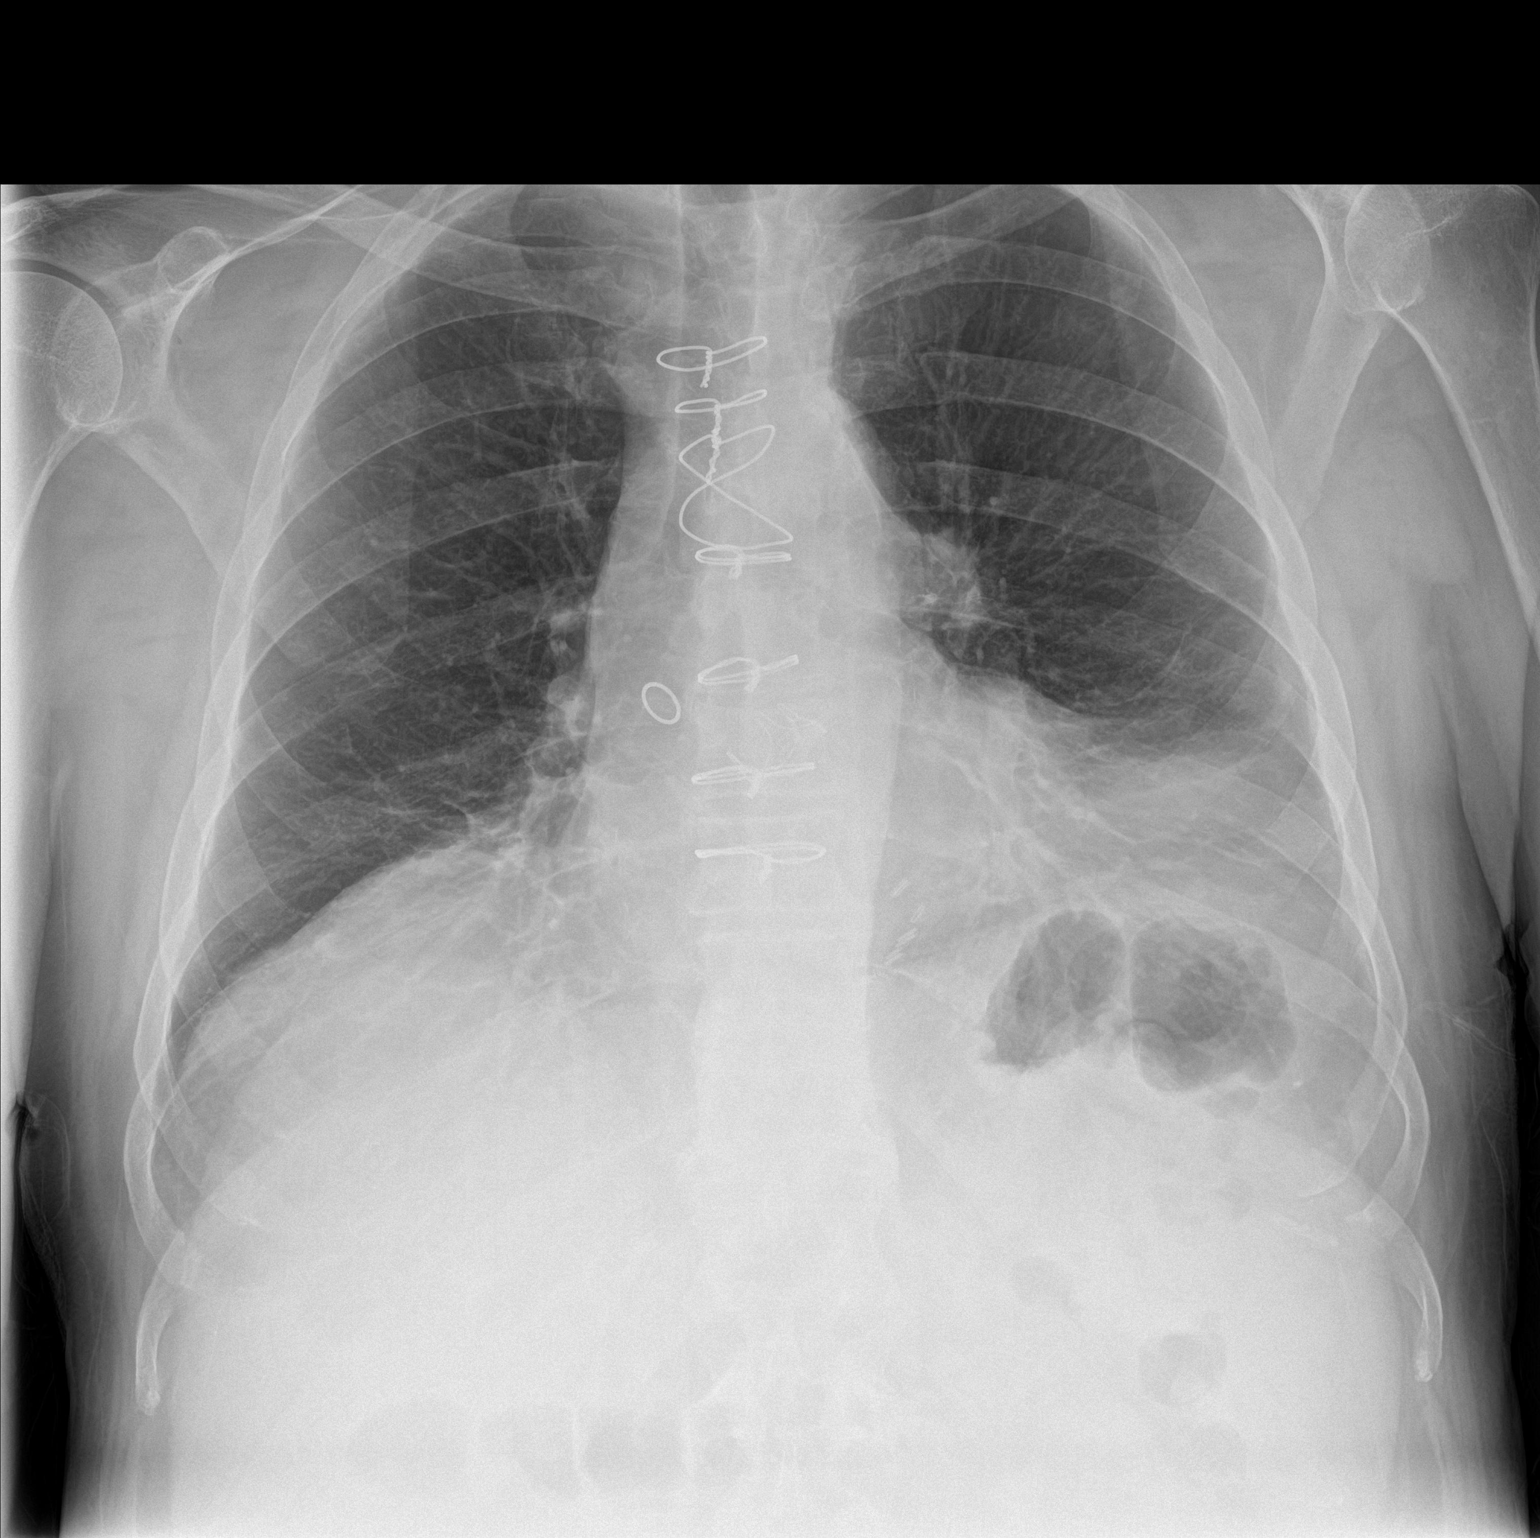

[chest lat]
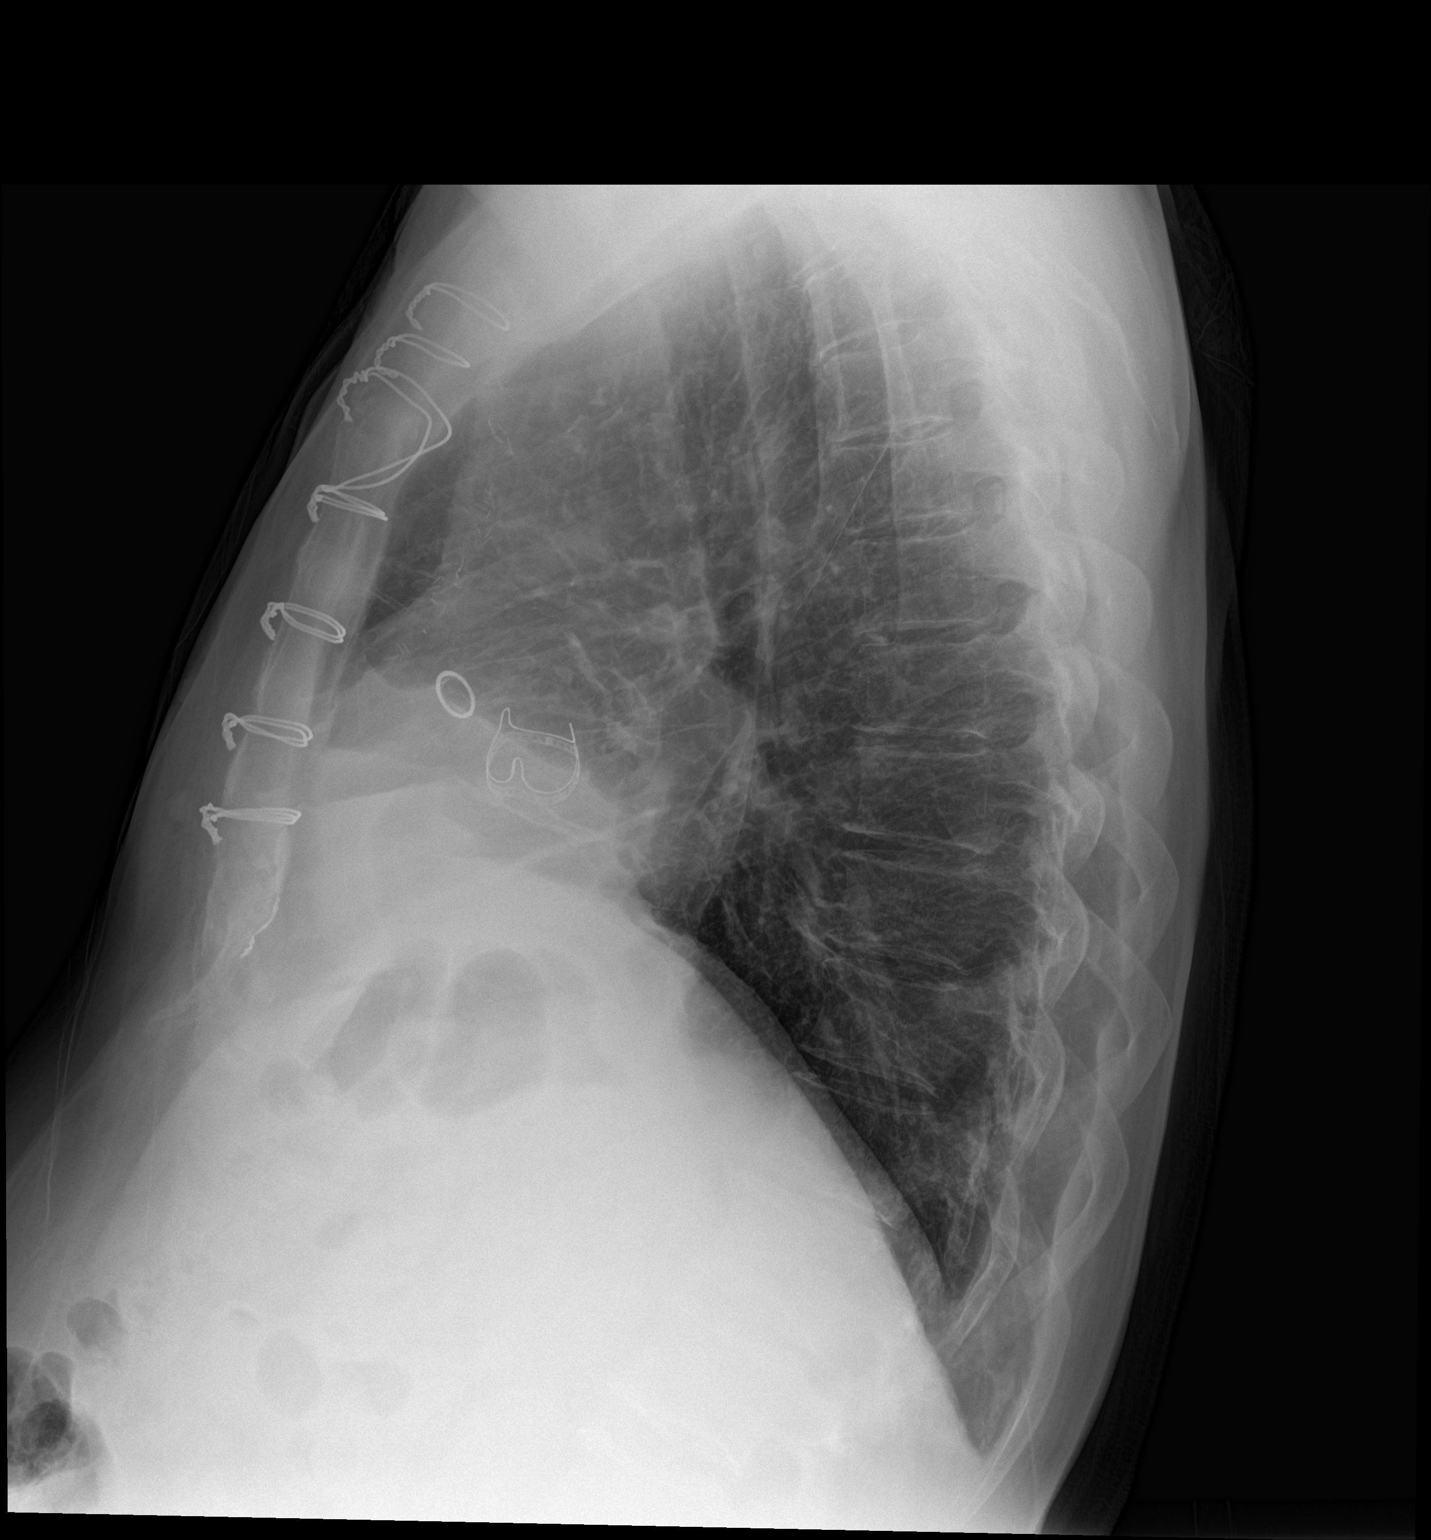

[2 of 2 positions shown; findings below may reference images not displayed]

FINDINGS: There is stable scarring in the left lower lobe region. Milder
scarring is noted in the right base region. There is no edema or
consolidation. Heart size and pulmonary vascularity are normal. No
adenopathy. Patient is status post coronary artery bypass grafting
and aortic valve replacement. No adenopathy. No bone lesions.
IMPRESSION: Stable lower lung zones scarring, more on the left than on the
right. No edema or consolidation. No change in cardiac silhouette.

## 2016-07-11 IMAGING — CR DG CHEST 2V
1 series · 2 of 2 positions shown · non-contrast
Comparison: Two-view chest x-ray 03/23/2015.

CLINICAL DATA: Dizziness.  Left-sided chest pain.

EXAM:
CHEST - 2 VIEW

[Series 1: dg chest 2 view · 0.14mm/px · 2 of 2 slices shown]
[im 1/2]
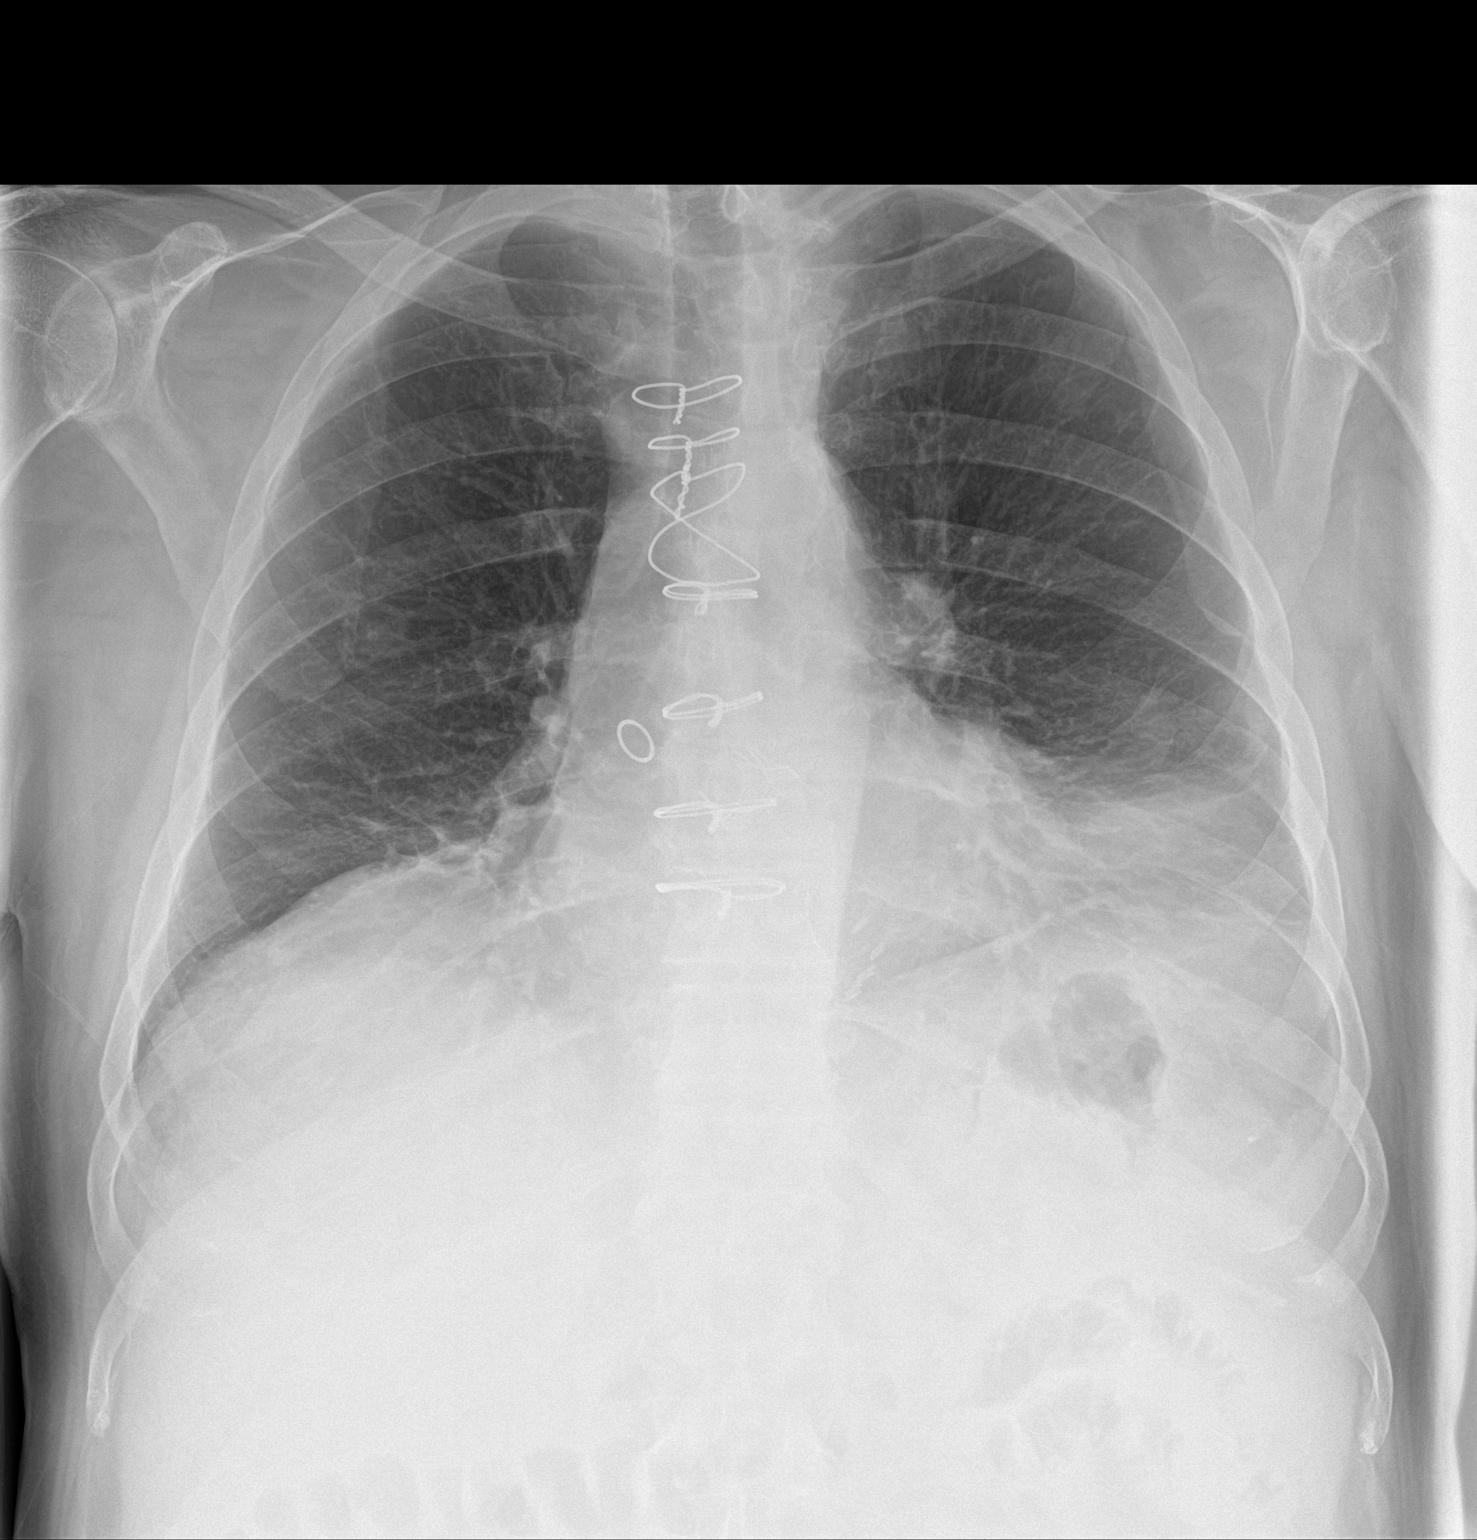
[im 2/2]
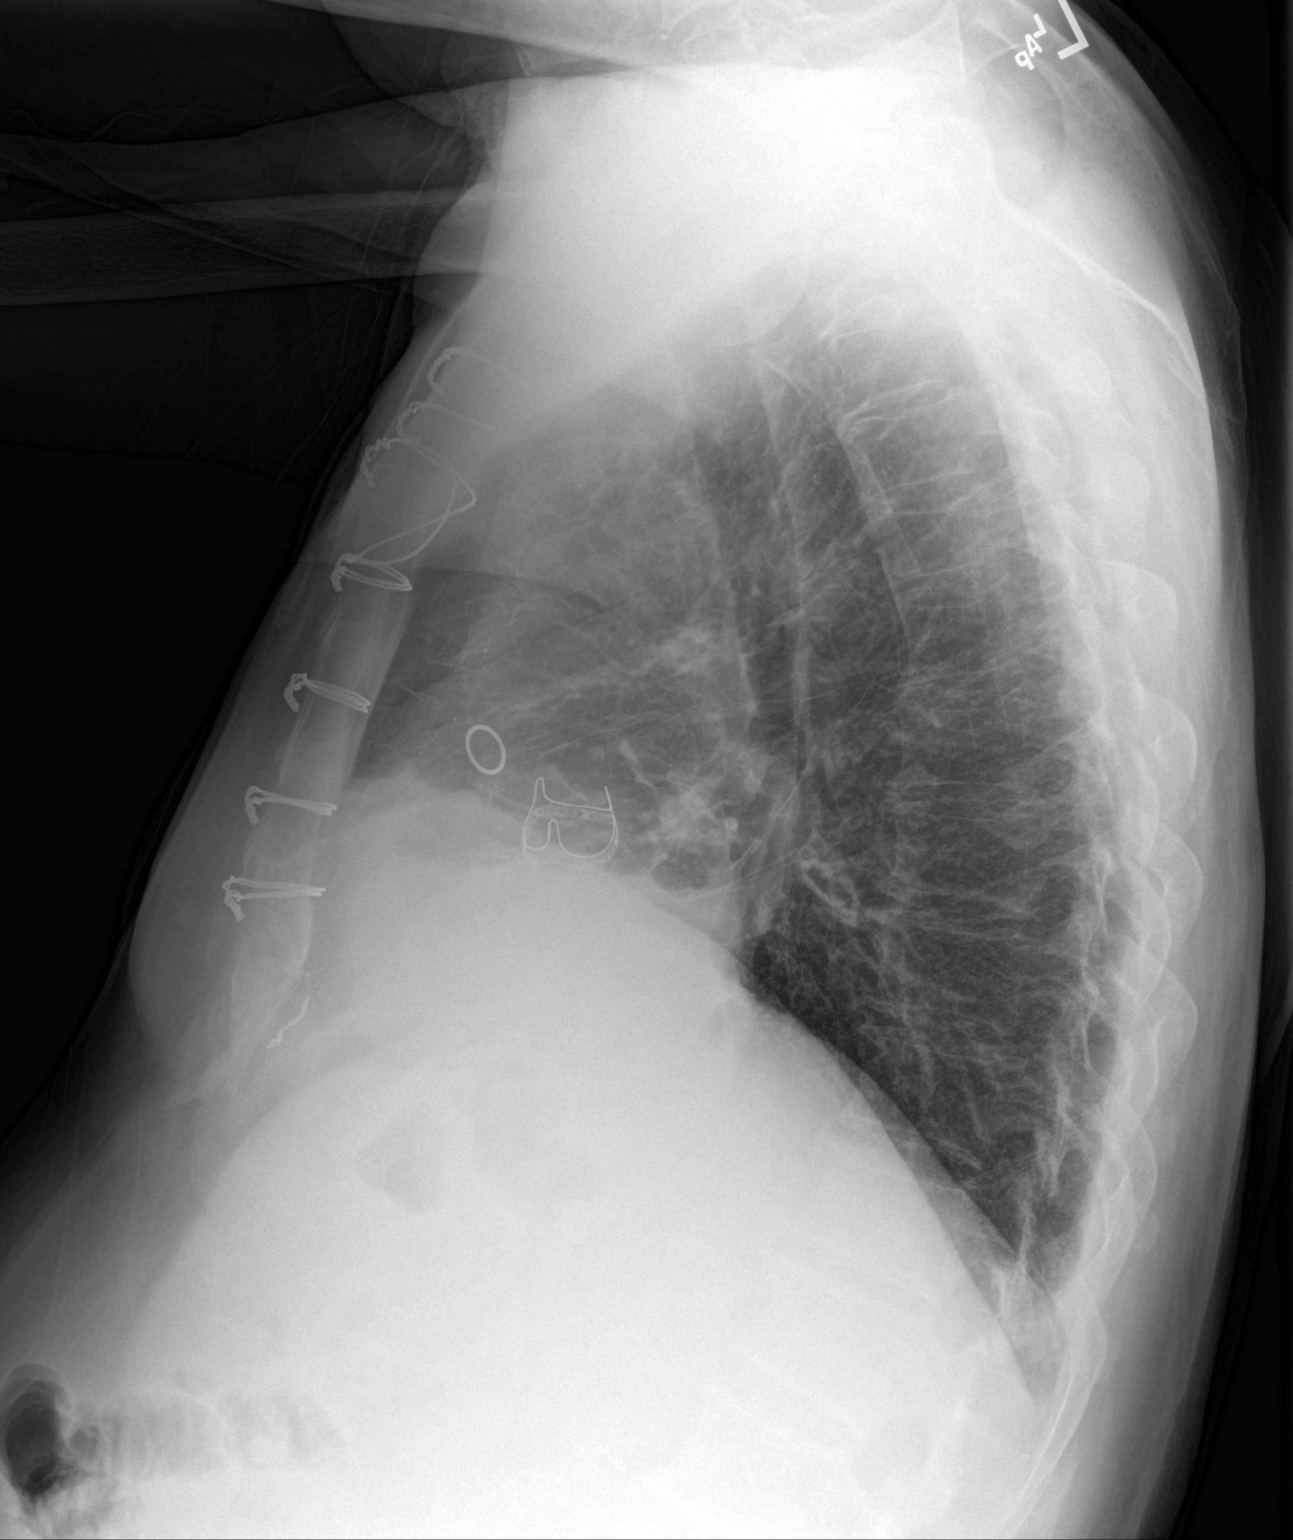

[2 of 2 positions shown; findings below may reference images not displayed]

FINDINGS: Median sternotomy is again noted. The heart size is normal. The
lungs are clear. Degenerative changes of the thoracolumbar spine are
stable.
IMPRESSION: 1. No acute cardiopulmonary disease or significant interval change.
2. Stable scarring in the lower lobes bilaterally.

## 2016-12-23 IMAGING — US US ART/VEN ABD/PELV/SCROTUM DOPPLER LTD
1 series · 14 of 25 positions shown · non-contrast
Comparison: Ultrasound 05/12/2015 and previous

CLINICAL DATA: Portal hypertension, ascites, no clinical history of
cirrhosis.

EXAM:
DUPLEX ULTRASOUND OF LIVER
TECHNIQUE: Color and duplex Doppler ultrasound was performed to evaluate the
hepatic in-flow and out-flow vessels.

[Series 1: us art/ven abd/pelv/scrotum doppler ltd · 0.26mm/px · 14 of 34 slices shown]
[im 1/34]
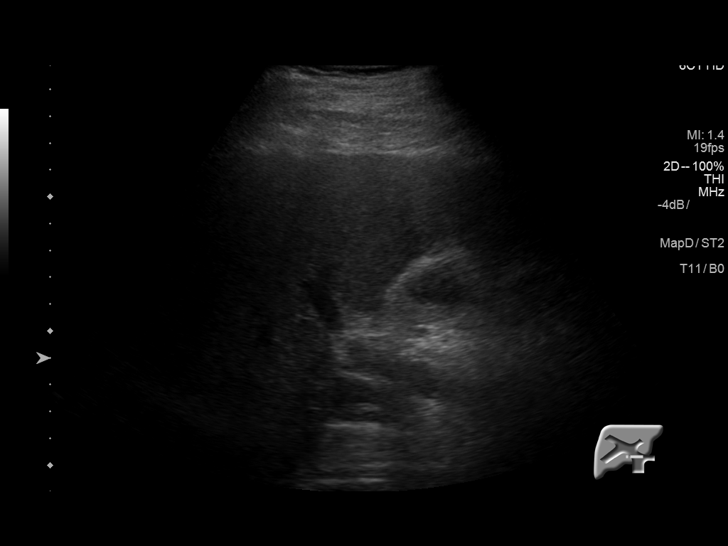
[im 3/34]
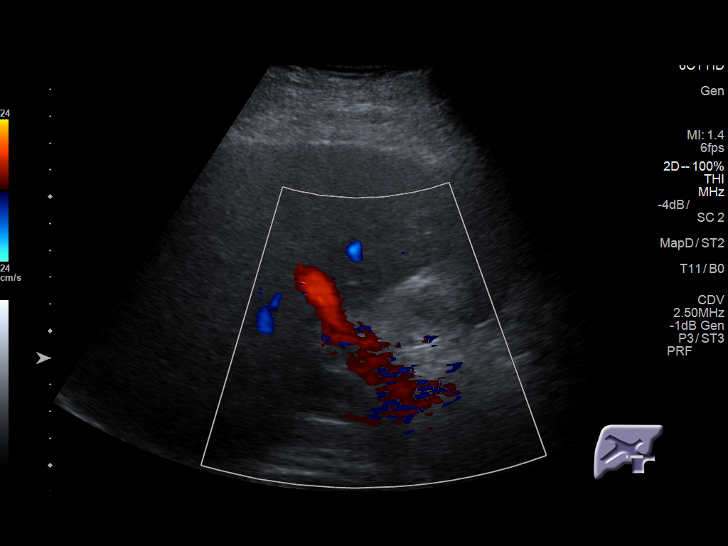
[im 6/34]
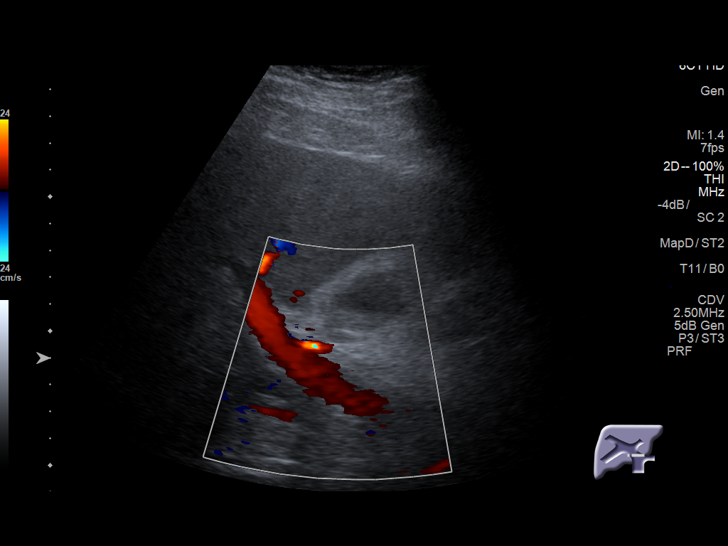
[im 9/34]
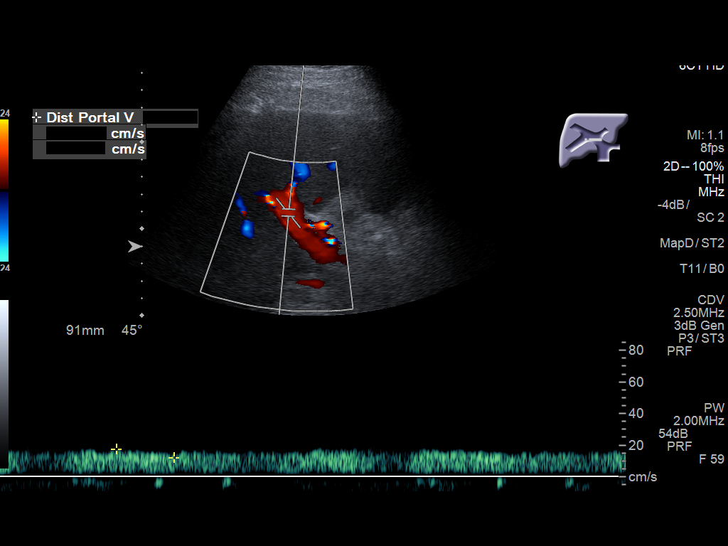
[im 12/34]
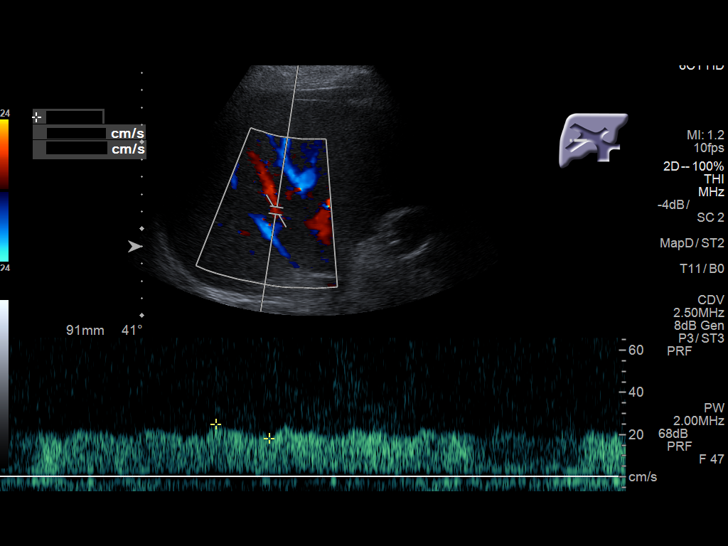
[im 13/34]
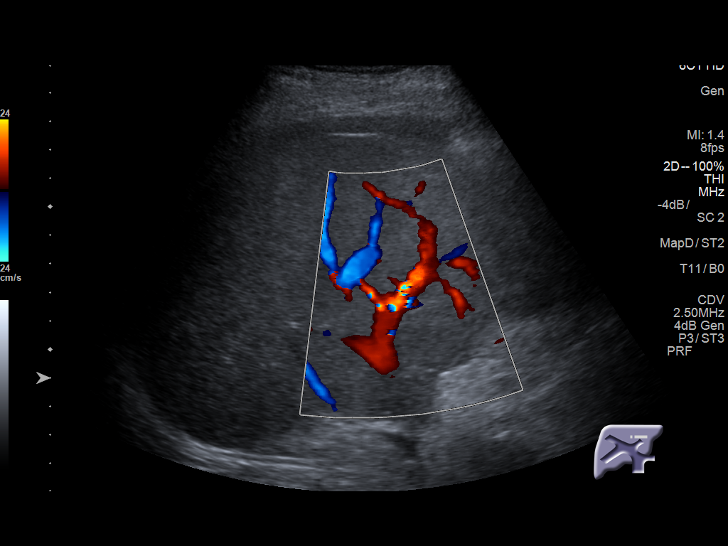
[im 16/34]
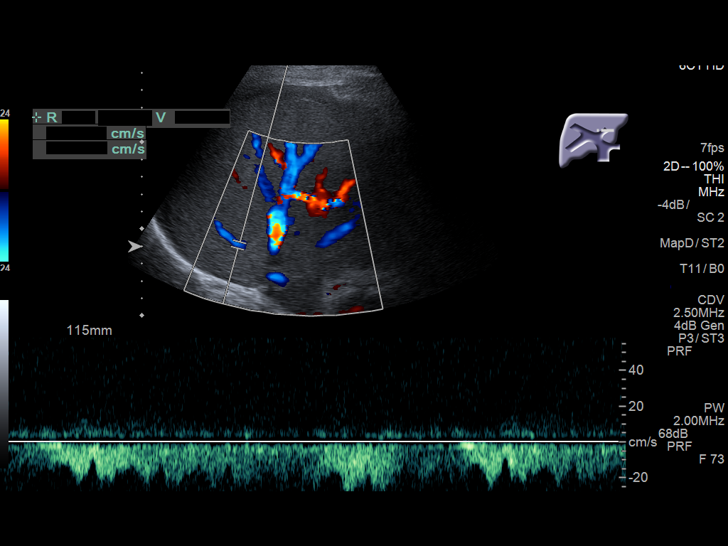
[im 18/34]
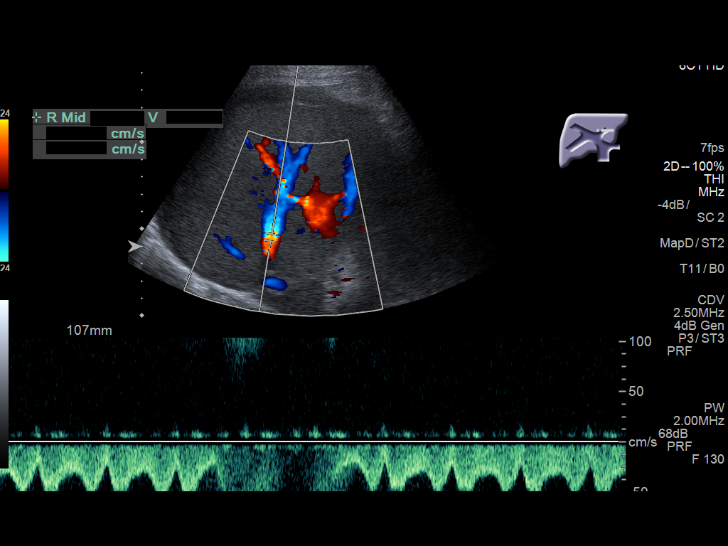
[im 21/34]
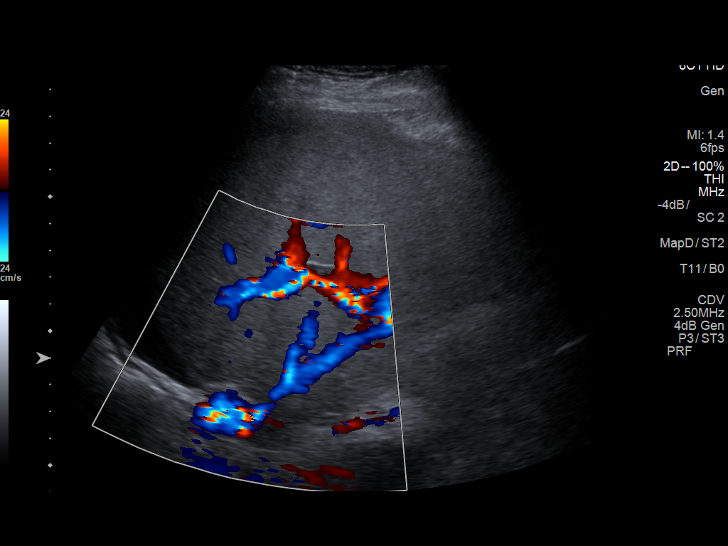
[im 23/34]
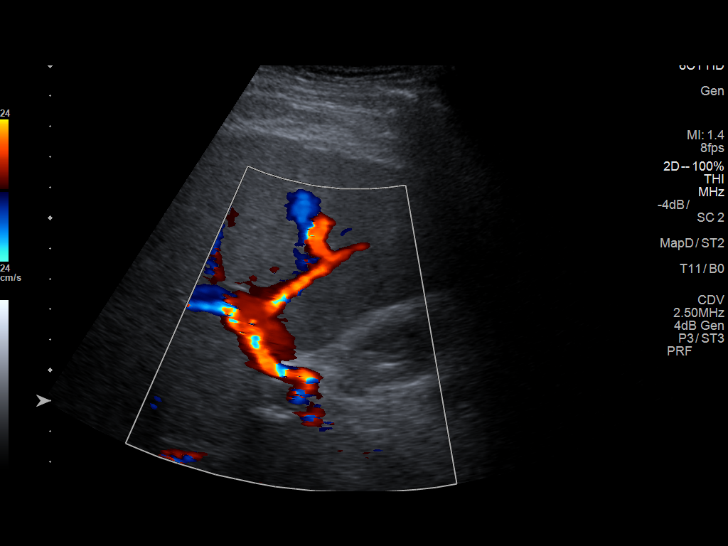
[im 25/34]
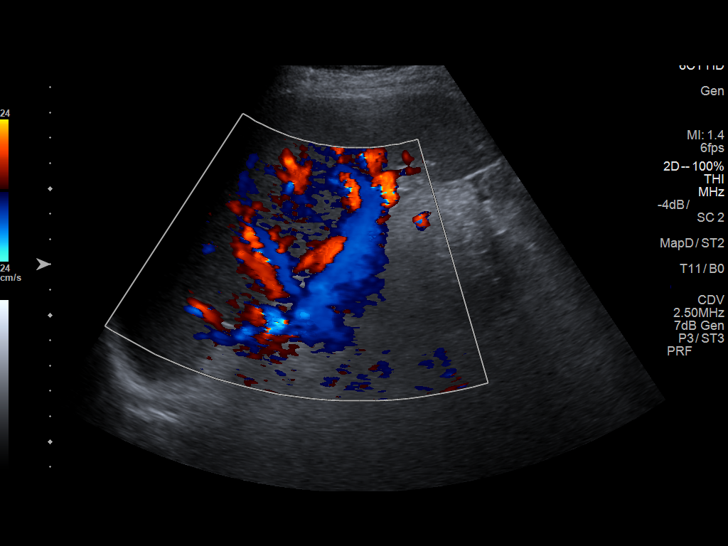
[im 28/34]
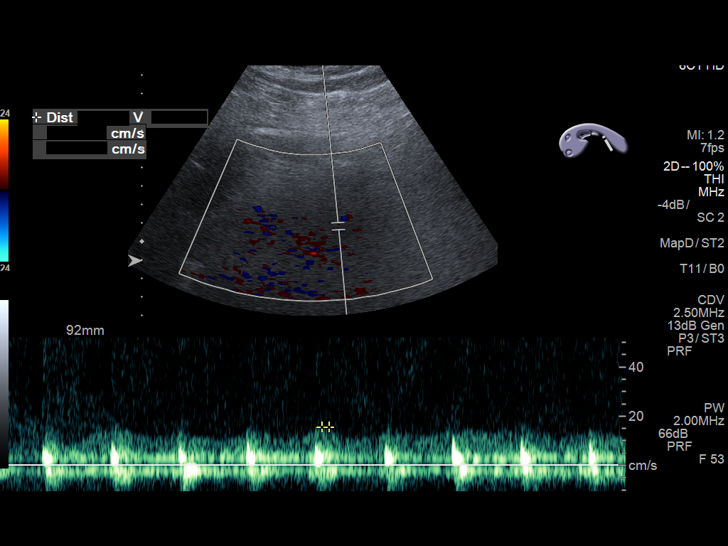
[im 31/34]
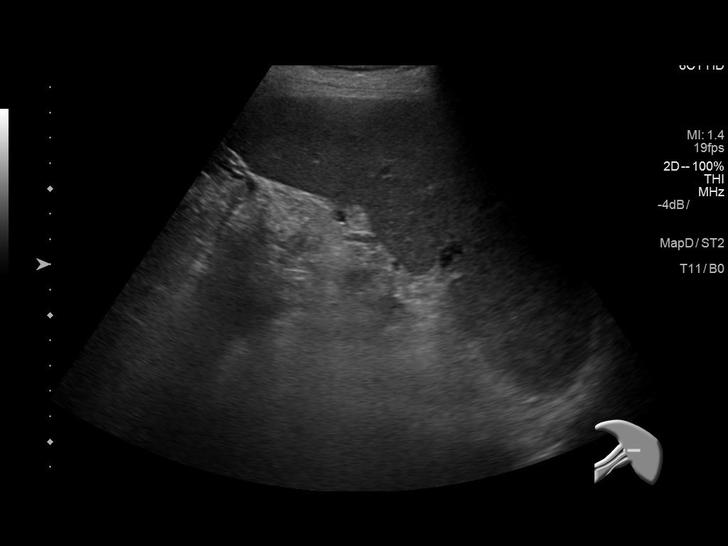
[im 34/34]
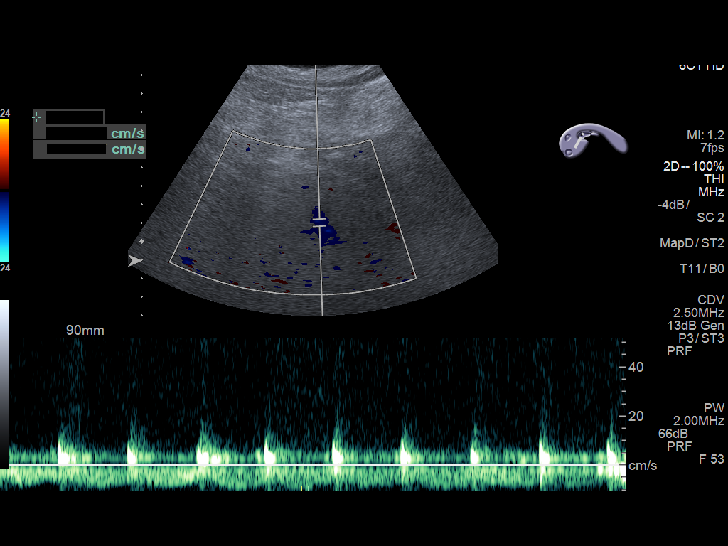

[14 of 25 positions shown; findings below may reference images not displayed]

FINDINGS: Portal Vein: No occlusion or thrombus. Velocities (all hepatopetal):

Main:  18-20 cm/sec

Right:  25 cm/sec

Left:  16 cm/sec

Intrahepatic IVC 57 cm/sec

Hepatic Vein Velocities (all hepatofugal):

Right:  31 cm/sec

Middle:  59 cm/sec

Left:  38 cm/sec

Hepatic Artery Velocity:  345 cm/sec

Spleen 14.2 x 6.7 x 17.1 cm.

Splenic Vein: Antegrade flow at hilum. Portosplenic confluence was
not visualized due to overlying bowel gas. No occlusion or thrombus
evident. Velocity: 16 cm/sec

Varices: Not identified

Ascites: Present
IMPRESSION: 1. Normal hepatic vascular Doppler assessment.
2. Splenomegaly and abdominal ascites, as previously noted.

## 2016-12-24 IMAGING — US US PARACENTESIS
1 series · 10 of 10 positions shown · non-contrast
Comparison: none

CLINICAL DATA: Ascites

EXAM:
ULTRASOUND GUIDED PARACENTESIS
TECHNIQUE: Survey ultrasound of the abdomen was performed and an appropriate
skin entry site in the right lower abdomen was selected. Skin site
was marked, prepped with Betadine, and draped in usual sterile
fashion, and infiltrated locally with 1% lidocaine. A 5 French
multisidehole Dadiyo Lorita needle was advanced into the peritoneal
space until fluid could be aspirated. The sheath was advanced and
the needle removed. 1.5 L of clear ascites were aspirated.
COMPLICATIONS:
None

[Series 1: us paracentesis · 0.33mm/px · 10 of 10 slices shown]
[im 1/10]
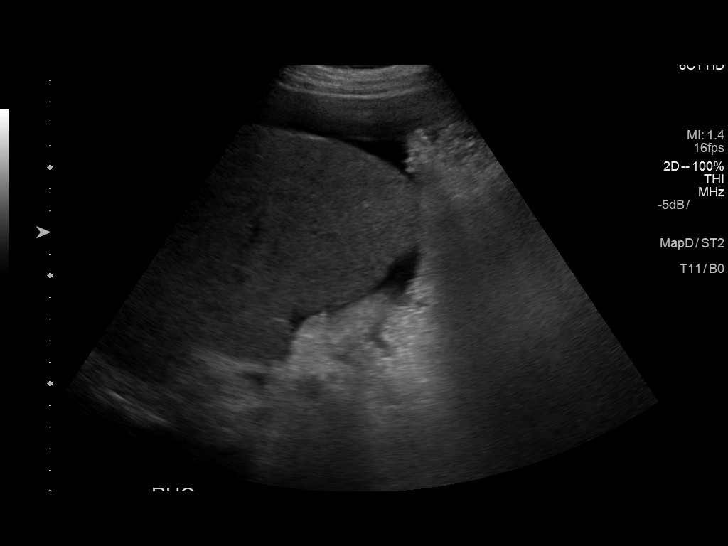
[im 2/10]
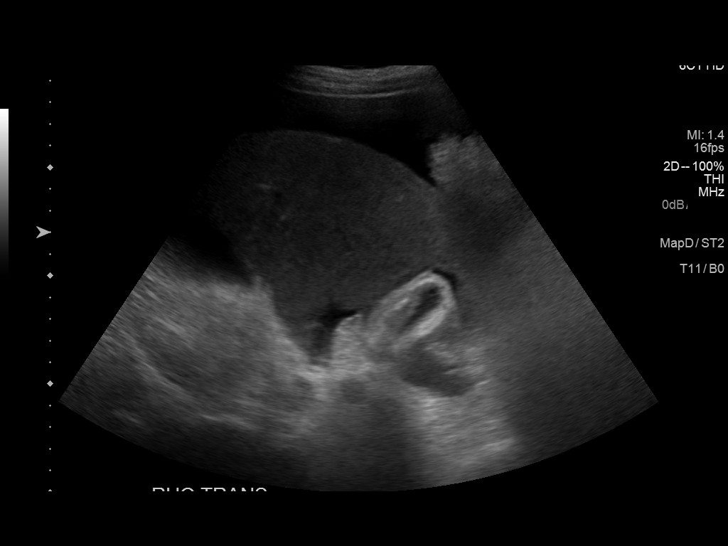
[im 3/10]
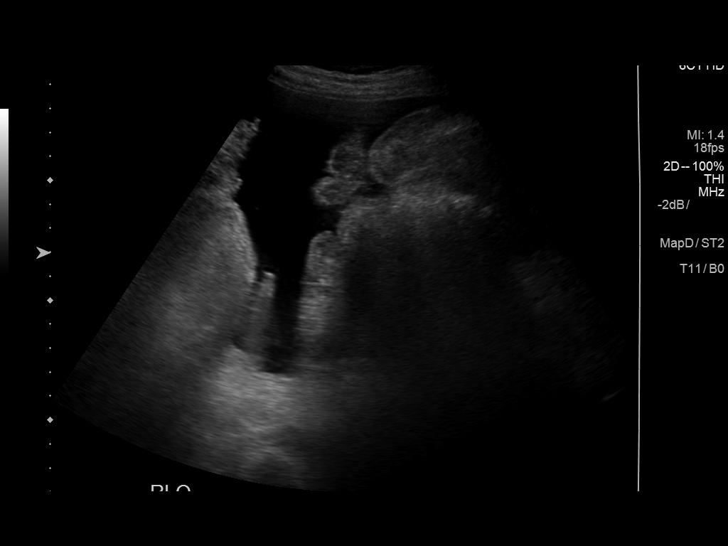
[im 4/10]
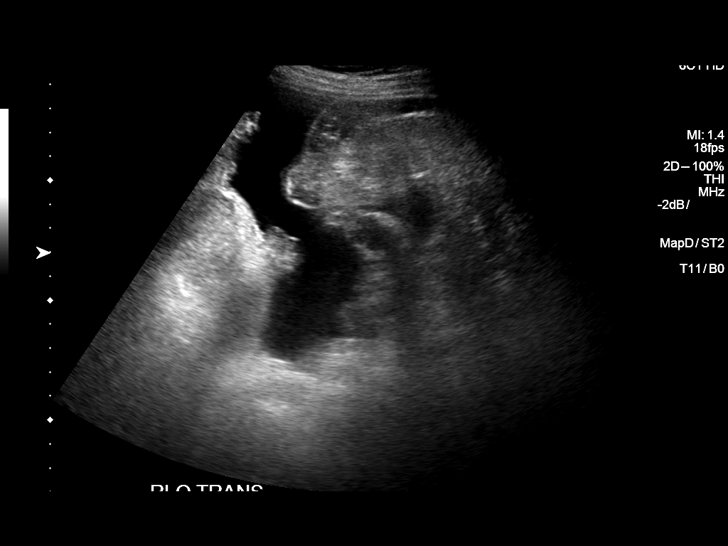
[im 5/10]
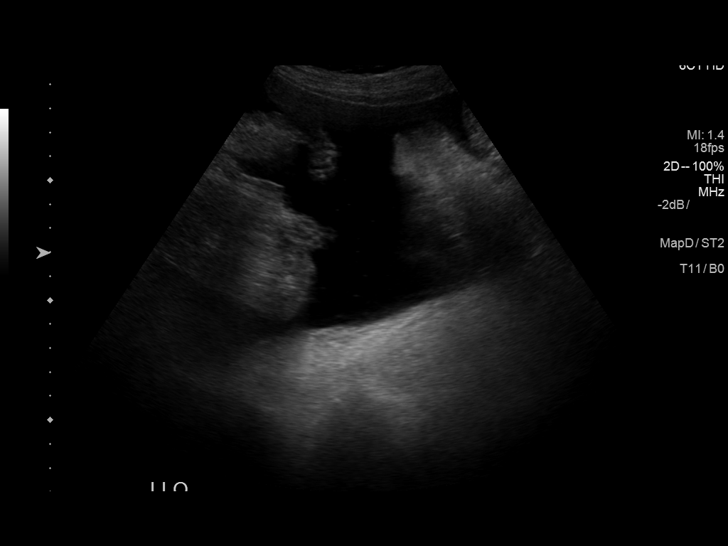
[im 6/10]
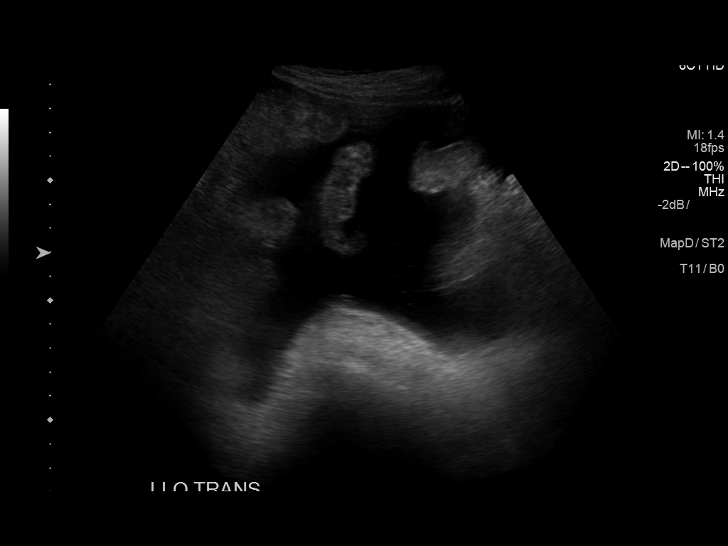
[im 7/10]
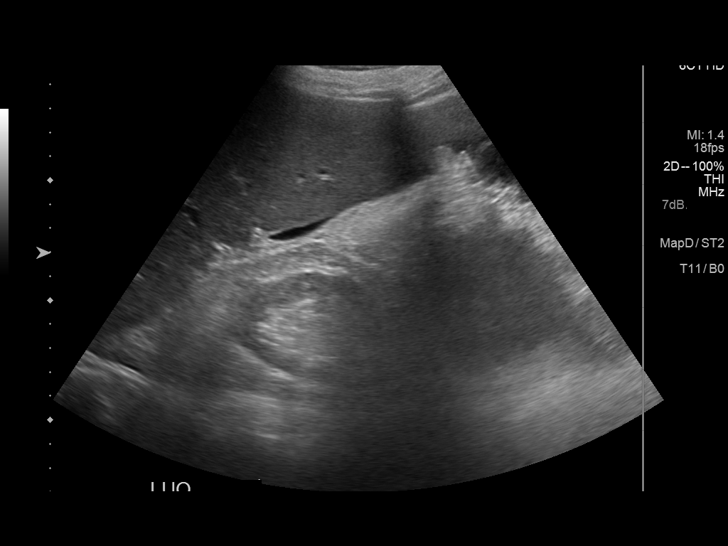
[im 8/10]
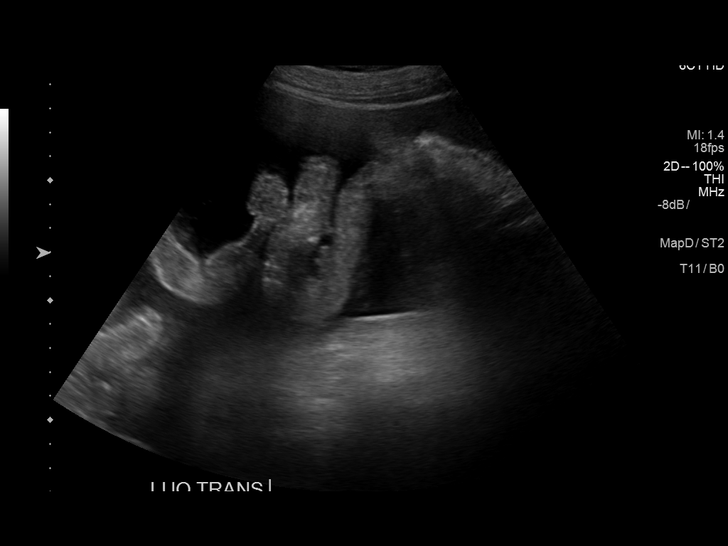
[im 9/10]
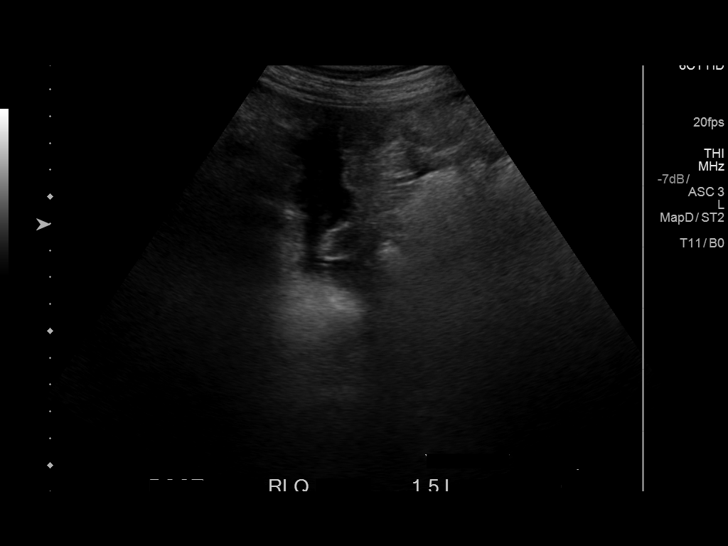
[im 10/10]
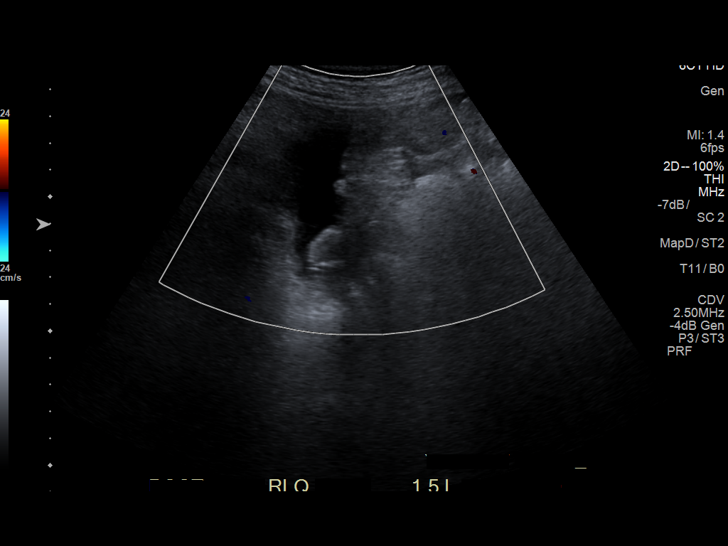

[10 of 10 positions shown; findings below may reference images not displayed]

IMPRESSION: Technically successful ultrasound guided paracentesis, removing
L of ascites.
